# Patient Record
Sex: Male | Born: 1965 | Race: White | Hispanic: No | Marital: Married | Smoking: Current some day smoker
Health system: Southern US, Community
[De-identification: ages and names within clinical notes are randomized; demographics above are authoritative.]

## PROBLEM LIST (undated history)

## (undated) DIAGNOSIS — I219 Acute myocardial infarction, unspecified: Secondary | ICD-10-CM

## (undated) DIAGNOSIS — M869 Osteomyelitis, unspecified: Secondary | ICD-10-CM

## (undated) DIAGNOSIS — D649 Anemia, unspecified: Secondary | ICD-10-CM

## (undated) DIAGNOSIS — J189 Pneumonia, unspecified organism: Secondary | ICD-10-CM

## (undated) DIAGNOSIS — E119 Type 2 diabetes mellitus without complications: Secondary | ICD-10-CM

## (undated) DIAGNOSIS — E785 Hyperlipidemia, unspecified: Secondary | ICD-10-CM

## (undated) DIAGNOSIS — R51 Headache: Secondary | ICD-10-CM

## (undated) DIAGNOSIS — F32A Depression, unspecified: Secondary | ICD-10-CM

## (undated) DIAGNOSIS — I10 Essential (primary) hypertension: Secondary | ICD-10-CM

## (undated) DIAGNOSIS — G4730 Sleep apnea, unspecified: Secondary | ICD-10-CM

## (undated) DIAGNOSIS — E1140 Type 2 diabetes mellitus with diabetic neuropathy, unspecified: Secondary | ICD-10-CM

## (undated) DIAGNOSIS — R519 Headache, unspecified: Secondary | ICD-10-CM

## (undated) DIAGNOSIS — I2510 Atherosclerotic heart disease of native coronary artery without angina pectoris: Secondary | ICD-10-CM

## (undated) DIAGNOSIS — I509 Heart failure, unspecified: Secondary | ICD-10-CM

## (undated) DIAGNOSIS — J45909 Unspecified asthma, uncomplicated: Secondary | ICD-10-CM

## (undated) DIAGNOSIS — N189 Chronic kidney disease, unspecified: Secondary | ICD-10-CM

## (undated) DIAGNOSIS — K219 Gastro-esophageal reflux disease without esophagitis: Secondary | ICD-10-CM

## (undated) DIAGNOSIS — F329 Major depressive disorder, single episode, unspecified: Secondary | ICD-10-CM

## (undated) HISTORY — PX: CARDIAC CATHETERIZATION: SHX172

## (undated) HISTORY — PX: BELOW KNEE LEG AMPUTATION: SUR23

## (undated) HISTORY — PX: HERNIA REPAIR: SHX51

## (undated) HISTORY — PX: COLONOSCOPY W/ BIOPSIES AND POLYPECTOMY: SHX1376

## (undated) HISTORY — PX: EYE SURGERY: SHX253

## (undated) HISTORY — PX: CHOLECYSTECTOMY: SHX55

## (undated) HISTORY — PX: CORONARY ARTERY BYPASS GRAFT: SHX141

---

## 2013-12-05 DIAGNOSIS — L089 Local infection of the skin and subcutaneous tissue, unspecified: Secondary | ICD-10-CM | POA: Diagnosis not present

## 2013-12-05 DIAGNOSIS — Z794 Long term (current) use of insulin: Secondary | ICD-10-CM | POA: Diagnosis not present

## 2013-12-05 DIAGNOSIS — I809 Phlebitis and thrombophlebitis of unspecified site: Secondary | ICD-10-CM | POA: Diagnosis not present

## 2013-12-05 DIAGNOSIS — E7800 Pure hypercholesterolemia, unspecified: Secondary | ICD-10-CM | POA: Diagnosis not present

## 2013-12-05 DIAGNOSIS — Z79899 Other long term (current) drug therapy: Secondary | ICD-10-CM | POA: Diagnosis not present

## 2013-12-05 DIAGNOSIS — K8020 Calculus of gallbladder without cholecystitis without obstruction: Secondary | ICD-10-CM | POA: Diagnosis not present

## 2013-12-05 DIAGNOSIS — R1011 Right upper quadrant pain: Secondary | ICD-10-CM | POA: Diagnosis not present

## 2013-12-05 DIAGNOSIS — R109 Unspecified abdominal pain: Secondary | ICD-10-CM | POA: Diagnosis not present

## 2013-12-05 DIAGNOSIS — R112 Nausea with vomiting, unspecified: Secondary | ICD-10-CM | POA: Diagnosis not present

## 2013-12-05 DIAGNOSIS — I10 Essential (primary) hypertension: Secondary | ICD-10-CM | POA: Diagnosis not present

## 2013-12-05 DIAGNOSIS — I509 Heart failure, unspecified: Secondary | ICD-10-CM | POA: Diagnosis not present

## 2013-12-05 DIAGNOSIS — K5900 Constipation, unspecified: Secondary | ICD-10-CM | POA: Diagnosis not present

## 2013-12-05 DIAGNOSIS — E119 Type 2 diabetes mellitus without complications: Secondary | ICD-10-CM | POA: Diagnosis not present

## 2013-12-06 DIAGNOSIS — Z794 Long term (current) use of insulin: Secondary | ICD-10-CM | POA: Diagnosis not present

## 2013-12-06 DIAGNOSIS — I10 Essential (primary) hypertension: Secondary | ICD-10-CM | POA: Diagnosis not present

## 2013-12-06 DIAGNOSIS — Z79899 Other long term (current) drug therapy: Secondary | ICD-10-CM | POA: Diagnosis not present

## 2013-12-06 DIAGNOSIS — E7800 Pure hypercholesterolemia, unspecified: Secondary | ICD-10-CM | POA: Diagnosis not present

## 2013-12-06 DIAGNOSIS — I808 Phlebitis and thrombophlebitis of other sites: Secondary | ICD-10-CM | POA: Diagnosis not present

## 2013-12-06 DIAGNOSIS — I509 Heart failure, unspecified: Secondary | ICD-10-CM | POA: Diagnosis not present

## 2013-12-06 DIAGNOSIS — I809 Phlebitis and thrombophlebitis of unspecified site: Secondary | ICD-10-CM | POA: Diagnosis not present

## 2013-12-06 DIAGNOSIS — E119 Type 2 diabetes mellitus without complications: Secondary | ICD-10-CM | POA: Diagnosis not present

## 2013-12-14 DIAGNOSIS — I509 Heart failure, unspecified: Secondary | ICD-10-CM | POA: Diagnosis not present

## 2013-12-14 DIAGNOSIS — R1031 Right lower quadrant pain: Secondary | ICD-10-CM | POA: Diagnosis not present

## 2013-12-14 DIAGNOSIS — E119 Type 2 diabetes mellitus without complications: Secondary | ICD-10-CM | POA: Diagnosis not present

## 2013-12-14 DIAGNOSIS — E7800 Pure hypercholesterolemia, unspecified: Secondary | ICD-10-CM | POA: Diagnosis not present

## 2013-12-14 DIAGNOSIS — I10 Essential (primary) hypertension: Secondary | ICD-10-CM | POA: Diagnosis not present

## 2013-12-14 DIAGNOSIS — R0989 Other specified symptoms and signs involving the circulatory and respiratory systems: Secondary | ICD-10-CM | POA: Diagnosis not present

## 2013-12-14 DIAGNOSIS — K2990 Gastroduodenitis, unspecified, without bleeding: Secondary | ICD-10-CM | POA: Diagnosis not present

## 2013-12-14 DIAGNOSIS — R109 Unspecified abdominal pain: Secondary | ICD-10-CM | POA: Diagnosis not present

## 2013-12-14 DIAGNOSIS — K2970 Gastritis, unspecified, without bleeding: Secondary | ICD-10-CM | POA: Diagnosis not present

## 2013-12-14 DIAGNOSIS — Z794 Long term (current) use of insulin: Secondary | ICD-10-CM | POA: Diagnosis not present

## 2013-12-14 DIAGNOSIS — R509 Fever, unspecified: Secondary | ICD-10-CM | POA: Diagnosis not present

## 2013-12-14 DIAGNOSIS — R110 Nausea: Secondary | ICD-10-CM | POA: Diagnosis not present

## 2013-12-14 DIAGNOSIS — R112 Nausea with vomiting, unspecified: Secondary | ICD-10-CM | POA: Diagnosis not present

## 2013-12-14 DIAGNOSIS — Z79899 Other long term (current) drug therapy: Secondary | ICD-10-CM | POA: Diagnosis not present

## 2013-12-14 DIAGNOSIS — R1011 Right upper quadrant pain: Secondary | ICD-10-CM | POA: Diagnosis not present

## 2013-12-14 DIAGNOSIS — K828 Other specified diseases of gallbladder: Secondary | ICD-10-CM | POA: Diagnosis not present

## 2013-12-22 DIAGNOSIS — G4710 Hypersomnia, unspecified: Secondary | ICD-10-CM | POA: Diagnosis not present

## 2013-12-24 DIAGNOSIS — G4733 Obstructive sleep apnea (adult) (pediatric): Secondary | ICD-10-CM | POA: Diagnosis not present

## 2013-12-24 DIAGNOSIS — K8010 Calculus of gallbladder with chronic cholecystitis without obstruction: Secondary | ICD-10-CM | POA: Diagnosis not present

## 2013-12-24 DIAGNOSIS — E119 Type 2 diabetes mellitus without complications: Secondary | ICD-10-CM | POA: Diagnosis not present

## 2013-12-24 DIAGNOSIS — D689 Coagulation defect, unspecified: Secondary | ICD-10-CM | POA: Diagnosis not present

## 2013-12-30 DIAGNOSIS — Z951 Presence of aortocoronary bypass graft: Secondary | ICD-10-CM | POA: Diagnosis not present

## 2013-12-30 DIAGNOSIS — I7090 Unspecified atherosclerosis: Secondary | ICD-10-CM | POA: Diagnosis not present

## 2013-12-30 DIAGNOSIS — I10 Essential (primary) hypertension: Secondary | ICD-10-CM | POA: Diagnosis not present

## 2013-12-30 DIAGNOSIS — I2699 Other pulmonary embolism without acute cor pulmonale: Secondary | ICD-10-CM | POA: Diagnosis not present

## 2013-12-30 DIAGNOSIS — I252 Old myocardial infarction: Secondary | ICD-10-CM | POA: Diagnosis not present

## 2013-12-30 DIAGNOSIS — I739 Peripheral vascular disease, unspecified: Secondary | ICD-10-CM | POA: Diagnosis not present

## 2013-12-30 DIAGNOSIS — E785 Hyperlipidemia, unspecified: Secondary | ICD-10-CM | POA: Diagnosis not present

## 2013-12-30 DIAGNOSIS — I2510 Atherosclerotic heart disease of native coronary artery without angina pectoris: Secondary | ICD-10-CM | POA: Diagnosis not present

## 2014-01-04 DIAGNOSIS — G4730 Sleep apnea, unspecified: Secondary | ICD-10-CM | POA: Diagnosis not present

## 2014-01-11 DIAGNOSIS — I2510 Atherosclerotic heart disease of native coronary artery without angina pectoris: Secondary | ICD-10-CM | POA: Diagnosis not present

## 2014-01-11 DIAGNOSIS — K8010 Calculus of gallbladder with chronic cholecystitis without obstruction: Secondary | ICD-10-CM | POA: Diagnosis not present

## 2014-01-11 DIAGNOSIS — E119 Type 2 diabetes mellitus without complications: Secondary | ICD-10-CM | POA: Diagnosis not present

## 2014-01-11 DIAGNOSIS — R109 Unspecified abdominal pain: Secondary | ICD-10-CM | POA: Diagnosis not present

## 2014-01-12 DIAGNOSIS — I2510 Atherosclerotic heart disease of native coronary artery without angina pectoris: Secondary | ICD-10-CM | POA: Diagnosis present

## 2014-01-12 DIAGNOSIS — Z951 Presence of aortocoronary bypass graft: Secondary | ICD-10-CM | POA: Diagnosis not present

## 2014-01-12 DIAGNOSIS — D689 Coagulation defect, unspecified: Secondary | ICD-10-CM | POA: Diagnosis not present

## 2014-01-12 DIAGNOSIS — I509 Heart failure, unspecified: Secondary | ICD-10-CM | POA: Diagnosis present

## 2014-01-12 DIAGNOSIS — K219 Gastro-esophageal reflux disease without esophagitis: Secondary | ICD-10-CM | POA: Diagnosis present

## 2014-01-12 DIAGNOSIS — Z87891 Personal history of nicotine dependence: Secondary | ICD-10-CM | POA: Diagnosis not present

## 2014-01-12 DIAGNOSIS — Z79899 Other long term (current) drug therapy: Secondary | ICD-10-CM | POA: Diagnosis not present

## 2014-01-12 DIAGNOSIS — Z7982 Long term (current) use of aspirin: Secondary | ICD-10-CM | POA: Diagnosis not present

## 2014-01-12 DIAGNOSIS — I739 Peripheral vascular disease, unspecified: Secondary | ICD-10-CM | POA: Diagnosis present

## 2014-01-12 DIAGNOSIS — E785 Hyperlipidemia, unspecified: Secondary | ICD-10-CM | POA: Diagnosis present

## 2014-01-12 DIAGNOSIS — Z8614 Personal history of Methicillin resistant Staphylococcus aureus infection: Secondary | ICD-10-CM | POA: Diagnosis not present

## 2014-01-12 DIAGNOSIS — Z23 Encounter for immunization: Secondary | ICD-10-CM | POA: Diagnosis not present

## 2014-01-12 DIAGNOSIS — K660 Peritoneal adhesions (postprocedural) (postinfection): Secondary | ICD-10-CM | POA: Diagnosis present

## 2014-01-12 DIAGNOSIS — I129 Hypertensive chronic kidney disease with stage 1 through stage 4 chronic kidney disease, or unspecified chronic kidney disease: Secondary | ICD-10-CM | POA: Diagnosis present

## 2014-01-12 DIAGNOSIS — E119 Type 2 diabetes mellitus without complications: Secondary | ICD-10-CM | POA: Diagnosis present

## 2014-01-12 DIAGNOSIS — S88119A Complete traumatic amputation at level between knee and ankle, unspecified lower leg, initial encounter: Secondary | ICD-10-CM | POA: Diagnosis not present

## 2014-01-12 DIAGNOSIS — Z86711 Personal history of pulmonary embolism: Secondary | ICD-10-CM | POA: Diagnosis not present

## 2014-01-12 DIAGNOSIS — Z7901 Long term (current) use of anticoagulants: Secondary | ICD-10-CM | POA: Diagnosis not present

## 2014-01-12 DIAGNOSIS — I252 Old myocardial infarction: Secondary | ICD-10-CM | POA: Diagnosis not present

## 2014-01-12 DIAGNOSIS — G4733 Obstructive sleep apnea (adult) (pediatric): Secondary | ICD-10-CM | POA: Diagnosis present

## 2014-01-12 DIAGNOSIS — K8010 Calculus of gallbladder with chronic cholecystitis without obstruction: Secondary | ICD-10-CM | POA: Diagnosis present

## 2014-01-12 DIAGNOSIS — N189 Chronic kidney disease, unspecified: Secondary | ICD-10-CM | POA: Diagnosis present

## 2014-01-14 DIAGNOSIS — N183 Chronic kidney disease, stage 3 unspecified: Secondary | ICD-10-CM | POA: Diagnosis not present

## 2014-01-14 DIAGNOSIS — I129 Hypertensive chronic kidney disease with stage 1 through stage 4 chronic kidney disease, or unspecified chronic kidney disease: Secondary | ICD-10-CM | POA: Diagnosis not present

## 2014-01-14 DIAGNOSIS — I509 Heart failure, unspecified: Secondary | ICD-10-CM | POA: Diagnosis not present

## 2014-01-14 DIAGNOSIS — L02818 Cutaneous abscess of other sites: Secondary | ICD-10-CM | POA: Diagnosis not present

## 2014-01-14 DIAGNOSIS — E119 Type 2 diabetes mellitus without complications: Secondary | ICD-10-CM | POA: Diagnosis not present

## 2014-01-14 DIAGNOSIS — E7800 Pure hypercholesterolemia, unspecified: Secondary | ICD-10-CM | POA: Diagnosis not present

## 2014-01-14 DIAGNOSIS — I2510 Atherosclerotic heart disease of native coronary artery without angina pectoris: Secondary | ICD-10-CM | POA: Diagnosis not present

## 2014-01-14 DIAGNOSIS — Z794 Long term (current) use of insulin: Secondary | ICD-10-CM | POA: Diagnosis not present

## 2014-01-14 DIAGNOSIS — Z79899 Other long term (current) drug therapy: Secondary | ICD-10-CM | POA: Diagnosis not present

## 2014-01-20 DIAGNOSIS — L03818 Cellulitis of other sites: Secondary | ICD-10-CM | POA: Diagnosis not present

## 2014-01-20 DIAGNOSIS — L02818 Cutaneous abscess of other sites: Secondary | ICD-10-CM | POA: Diagnosis not present

## 2014-01-20 DIAGNOSIS — E119 Type 2 diabetes mellitus without complications: Secondary | ICD-10-CM | POA: Diagnosis not present

## 2014-01-20 DIAGNOSIS — I2510 Atherosclerotic heart disease of native coronary artery without angina pectoris: Secondary | ICD-10-CM | POA: Diagnosis not present

## 2014-01-20 DIAGNOSIS — I129 Hypertensive chronic kidney disease with stage 1 through stage 4 chronic kidney disease, or unspecified chronic kidney disease: Secondary | ICD-10-CM | POA: Diagnosis not present

## 2014-01-20 DIAGNOSIS — R609 Edema, unspecified: Secondary | ICD-10-CM | POA: Diagnosis not present

## 2014-01-20 DIAGNOSIS — Z79899 Other long term (current) drug therapy: Secondary | ICD-10-CM | POA: Diagnosis not present

## 2014-01-20 DIAGNOSIS — Z794 Long term (current) use of insulin: Secondary | ICD-10-CM | POA: Diagnosis not present

## 2014-01-20 DIAGNOSIS — M79609 Pain in unspecified limb: Secondary | ICD-10-CM | POA: Diagnosis not present

## 2014-01-20 DIAGNOSIS — N183 Chronic kidney disease, stage 3 unspecified: Secondary | ICD-10-CM | POA: Diagnosis not present

## 2014-01-20 DIAGNOSIS — I509 Heart failure, unspecified: Secondary | ICD-10-CM | POA: Diagnosis not present

## 2014-01-20 DIAGNOSIS — E7800 Pure hypercholesterolemia, unspecified: Secondary | ICD-10-CM | POA: Diagnosis not present

## 2014-01-20 DIAGNOSIS — S88119A Complete traumatic amputation at level between knee and ankle, unspecified lower leg, initial encounter: Secondary | ICD-10-CM | POA: Diagnosis not present

## 2014-01-26 DIAGNOSIS — I2699 Other pulmonary embolism without acute cor pulmonale: Secondary | ICD-10-CM | POA: Diagnosis not present

## 2014-01-26 DIAGNOSIS — I10 Essential (primary) hypertension: Secondary | ICD-10-CM | POA: Diagnosis not present

## 2014-01-26 DIAGNOSIS — K219 Gastro-esophageal reflux disease without esophagitis: Secondary | ICD-10-CM | POA: Diagnosis not present

## 2014-01-26 DIAGNOSIS — IMO0001 Reserved for inherently not codable concepts without codable children: Secondary | ICD-10-CM | POA: Diagnosis not present

## 2014-02-07 DIAGNOSIS — E7800 Pure hypercholesterolemia, unspecified: Secondary | ICD-10-CM | POA: Diagnosis not present

## 2014-02-07 DIAGNOSIS — Z794 Long term (current) use of insulin: Secondary | ICD-10-CM | POA: Diagnosis not present

## 2014-02-07 DIAGNOSIS — L02818 Cutaneous abscess of other sites: Secondary | ICD-10-CM | POA: Diagnosis not present

## 2014-02-07 DIAGNOSIS — R221 Localized swelling, mass and lump, neck: Secondary | ICD-10-CM | POA: Diagnosis not present

## 2014-02-07 DIAGNOSIS — L03818 Cellulitis of other sites: Secondary | ICD-10-CM | POA: Diagnosis not present

## 2014-02-07 DIAGNOSIS — I509 Heart failure, unspecified: Secondary | ICD-10-CM | POA: Diagnosis not present

## 2014-02-07 DIAGNOSIS — N183 Chronic kidney disease, stage 3 unspecified: Secondary | ICD-10-CM | POA: Diagnosis not present

## 2014-02-07 DIAGNOSIS — I2510 Atherosclerotic heart disease of native coronary artery without angina pectoris: Secondary | ICD-10-CM | POA: Diagnosis not present

## 2014-02-07 DIAGNOSIS — Z79899 Other long term (current) drug therapy: Secondary | ICD-10-CM | POA: Diagnosis not present

## 2014-02-07 DIAGNOSIS — I129 Hypertensive chronic kidney disease with stage 1 through stage 4 chronic kidney disease, or unspecified chronic kidney disease: Secondary | ICD-10-CM | POA: Diagnosis not present

## 2014-02-07 DIAGNOSIS — E119 Type 2 diabetes mellitus without complications: Secondary | ICD-10-CM | POA: Diagnosis not present

## 2014-02-07 DIAGNOSIS — R220 Localized swelling, mass and lump, head: Secondary | ICD-10-CM | POA: Diagnosis not present

## 2014-03-10 DIAGNOSIS — G4730 Sleep apnea, unspecified: Secondary | ICD-10-CM | POA: Diagnosis not present

## 2014-03-10 DIAGNOSIS — I2510 Atherosclerotic heart disease of native coronary artery without angina pectoris: Secondary | ICD-10-CM | POA: Diagnosis not present

## 2014-03-11 DIAGNOSIS — I2510 Atherosclerotic heart disease of native coronary artery without angina pectoris: Secondary | ICD-10-CM | POA: Diagnosis not present

## 2014-03-11 DIAGNOSIS — E119 Type 2 diabetes mellitus without complications: Secondary | ICD-10-CM | POA: Diagnosis not present

## 2014-03-11 DIAGNOSIS — M542 Cervicalgia: Secondary | ICD-10-CM | POA: Diagnosis not present

## 2014-03-11 DIAGNOSIS — E860 Dehydration: Secondary | ICD-10-CM | POA: Diagnosis not present

## 2014-03-11 DIAGNOSIS — S88119A Complete traumatic amputation at level between knee and ankle, unspecified lower leg, initial encounter: Secondary | ICD-10-CM | POA: Diagnosis not present

## 2014-03-11 DIAGNOSIS — R5383 Other fatigue: Secondary | ICD-10-CM | POA: Diagnosis not present

## 2014-03-11 DIAGNOSIS — Z79899 Other long term (current) drug therapy: Secondary | ICD-10-CM | POA: Diagnosis not present

## 2014-03-11 DIAGNOSIS — N183 Chronic kidney disease, stage 3 unspecified: Secondary | ICD-10-CM | POA: Diagnosis not present

## 2014-03-11 DIAGNOSIS — I129 Hypertensive chronic kidney disease with stage 1 through stage 4 chronic kidney disease, or unspecified chronic kidney disease: Secondary | ICD-10-CM | POA: Diagnosis not present

## 2014-03-11 DIAGNOSIS — I509 Heart failure, unspecified: Secondary | ICD-10-CM | POA: Diagnosis not present

## 2014-03-11 DIAGNOSIS — R51 Headache: Secondary | ICD-10-CM | POA: Diagnosis not present

## 2014-03-11 DIAGNOSIS — R5381 Other malaise: Secondary | ICD-10-CM | POA: Diagnosis not present

## 2014-03-11 DIAGNOSIS — Z794 Long term (current) use of insulin: Secondary | ICD-10-CM | POA: Diagnosis not present

## 2014-03-11 DIAGNOSIS — E7800 Pure hypercholesterolemia, unspecified: Secondary | ICD-10-CM | POA: Diagnosis not present

## 2014-03-17 DIAGNOSIS — Z7901 Long term (current) use of anticoagulants: Secondary | ICD-10-CM | POA: Diagnosis not present

## 2014-03-17 DIAGNOSIS — E119 Type 2 diabetes mellitus without complications: Secondary | ICD-10-CM | POA: Diagnosis not present

## 2014-03-17 DIAGNOSIS — Z951 Presence of aortocoronary bypass graft: Secondary | ICD-10-CM | POA: Diagnosis not present

## 2014-03-17 DIAGNOSIS — Z79899 Other long term (current) drug therapy: Secondary | ICD-10-CM | POA: Diagnosis not present

## 2014-03-17 DIAGNOSIS — I129 Hypertensive chronic kidney disease with stage 1 through stage 4 chronic kidney disease, or unspecified chronic kidney disease: Secondary | ICD-10-CM | POA: Diagnosis not present

## 2014-03-17 DIAGNOSIS — I509 Heart failure, unspecified: Secondary | ICD-10-CM | POA: Diagnosis not present

## 2014-03-17 DIAGNOSIS — R079 Chest pain, unspecified: Secondary | ICD-10-CM | POA: Diagnosis not present

## 2014-03-17 DIAGNOSIS — I2510 Atherosclerotic heart disease of native coronary artery without angina pectoris: Secondary | ICD-10-CM | POA: Diagnosis not present

## 2014-03-17 DIAGNOSIS — Z794 Long term (current) use of insulin: Secondary | ICD-10-CM | POA: Diagnosis not present

## 2014-03-17 DIAGNOSIS — I252 Old myocardial infarction: Secondary | ICD-10-CM | POA: Diagnosis not present

## 2014-03-17 DIAGNOSIS — N189 Chronic kidney disease, unspecified: Secondary | ICD-10-CM | POA: Diagnosis not present

## 2014-03-17 DIAGNOSIS — I959 Hypotension, unspecified: Secondary | ICD-10-CM | POA: Diagnosis not present

## 2014-03-17 DIAGNOSIS — Z86711 Personal history of pulmonary embolism: Secondary | ICD-10-CM | POA: Diagnosis not present

## 2014-03-17 DIAGNOSIS — N2589 Other disorders resulting from impaired renal tubular function: Secondary | ICD-10-CM | POA: Diagnosis not present

## 2014-03-17 DIAGNOSIS — Z7982 Long term (current) use of aspirin: Secondary | ICD-10-CM | POA: Diagnosis not present

## 2014-03-17 DIAGNOSIS — I10 Essential (primary) hypertension: Secondary | ICD-10-CM | POA: Diagnosis not present

## 2014-03-22 DIAGNOSIS — I7090 Unspecified atherosclerosis: Secondary | ICD-10-CM | POA: Diagnosis not present

## 2014-03-22 DIAGNOSIS — E785 Hyperlipidemia, unspecified: Secondary | ICD-10-CM | POA: Diagnosis not present

## 2014-03-22 DIAGNOSIS — I2510 Atherosclerotic heart disease of native coronary artery without angina pectoris: Secondary | ICD-10-CM | POA: Diagnosis not present

## 2014-03-22 DIAGNOSIS — I252 Old myocardial infarction: Secondary | ICD-10-CM | POA: Diagnosis not present

## 2014-03-22 DIAGNOSIS — I10 Essential (primary) hypertension: Secondary | ICD-10-CM | POA: Diagnosis not present

## 2014-03-22 DIAGNOSIS — Z951 Presence of aortocoronary bypass graft: Secondary | ICD-10-CM | POA: Diagnosis not present

## 2014-04-07 DIAGNOSIS — S98919A Complete traumatic amputation of unspecified foot, level unspecified, initial encounter: Secondary | ICD-10-CM | POA: Diagnosis not present

## 2014-04-07 DIAGNOSIS — R262 Difficulty in walking, not elsewhere classified: Secondary | ICD-10-CM | POA: Diagnosis not present

## 2014-04-07 DIAGNOSIS — I2510 Atherosclerotic heart disease of native coronary artery without angina pectoris: Secondary | ICD-10-CM | POA: Diagnosis not present

## 2014-04-21 DIAGNOSIS — K219 Gastro-esophageal reflux disease without esophagitis: Secondary | ICD-10-CM | POA: Diagnosis not present

## 2014-04-21 DIAGNOSIS — Z7901 Long term (current) use of anticoagulants: Secondary | ICD-10-CM | POA: Diagnosis not present

## 2014-04-21 DIAGNOSIS — Z800 Family history of malignant neoplasm of digestive organs: Secondary | ICD-10-CM | POA: Diagnosis not present

## 2014-04-29 DIAGNOSIS — D131 Benign neoplasm of stomach: Secondary | ICD-10-CM | POA: Diagnosis not present

## 2014-04-29 DIAGNOSIS — Z7901 Long term (current) use of anticoagulants: Secondary | ICD-10-CM | POA: Diagnosis not present

## 2014-04-29 DIAGNOSIS — K219 Gastro-esophageal reflux disease without esophagitis: Secondary | ICD-10-CM | POA: Diagnosis not present

## 2014-04-29 DIAGNOSIS — Z800 Family history of malignant neoplasm of digestive organs: Secondary | ICD-10-CM | POA: Diagnosis not present

## 2014-04-29 DIAGNOSIS — D126 Benign neoplasm of colon, unspecified: Secondary | ICD-10-CM | POA: Diagnosis not present

## 2014-04-29 DIAGNOSIS — I2510 Atherosclerotic heart disease of native coronary artery without angina pectoris: Secondary | ICD-10-CM | POA: Diagnosis not present

## 2014-06-04 DIAGNOSIS — Z1389 Encounter for screening for other disorder: Secondary | ICD-10-CM | POA: Diagnosis not present

## 2014-06-04 DIAGNOSIS — Z719 Counseling, unspecified: Secondary | ICD-10-CM | POA: Diagnosis not present

## 2014-06-04 DIAGNOSIS — E1165 Type 2 diabetes mellitus with hyperglycemia: Secondary | ICD-10-CM | POA: Diagnosis not present

## 2014-06-04 DIAGNOSIS — E1140 Type 2 diabetes mellitus with diabetic neuropathy, unspecified: Secondary | ICD-10-CM | POA: Diagnosis not present

## 2014-07-05 DIAGNOSIS — I252 Old myocardial infarction: Secondary | ICD-10-CM | POA: Diagnosis not present

## 2014-07-05 DIAGNOSIS — K219 Gastro-esophageal reflux disease without esophagitis: Secondary | ICD-10-CM | POA: Diagnosis not present

## 2014-07-05 DIAGNOSIS — R9431 Abnormal electrocardiogram [ECG] [EKG]: Secondary | ICD-10-CM | POA: Diagnosis not present

## 2014-07-05 DIAGNOSIS — I509 Heart failure, unspecified: Secondary | ICD-10-CM | POA: Diagnosis not present

## 2014-07-05 DIAGNOSIS — Z87891 Personal history of nicotine dependence: Secondary | ICD-10-CM | POA: Diagnosis not present

## 2014-07-05 DIAGNOSIS — Z794 Long term (current) use of insulin: Secondary | ICD-10-CM | POA: Diagnosis not present

## 2014-07-05 DIAGNOSIS — Z23 Encounter for immunization: Secondary | ICD-10-CM | POA: Diagnosis not present

## 2014-07-05 DIAGNOSIS — Z951 Presence of aortocoronary bypass graft: Secondary | ICD-10-CM | POA: Diagnosis not present

## 2014-07-05 DIAGNOSIS — R072 Precordial pain: Secondary | ICD-10-CM | POA: Diagnosis not present

## 2014-07-05 DIAGNOSIS — Z79899 Other long term (current) drug therapy: Secondary | ICD-10-CM | POA: Diagnosis not present

## 2014-07-05 DIAGNOSIS — I200 Unstable angina: Secondary | ICD-10-CM | POA: Diagnosis not present

## 2014-07-05 DIAGNOSIS — I129 Hypertensive chronic kidney disease with stage 1 through stage 4 chronic kidney disease, or unspecified chronic kidney disease: Secondary | ICD-10-CM | POA: Diagnosis not present

## 2014-07-05 DIAGNOSIS — Z7982 Long term (current) use of aspirin: Secondary | ICD-10-CM | POA: Diagnosis not present

## 2014-07-05 DIAGNOSIS — N183 Chronic kidney disease, stage 3 (moderate): Secondary | ICD-10-CM | POA: Diagnosis not present

## 2014-07-05 DIAGNOSIS — E1159 Type 2 diabetes mellitus with other circulatory complications: Secondary | ICD-10-CM | POA: Diagnosis not present

## 2014-07-05 DIAGNOSIS — Z89511 Acquired absence of right leg below knee: Secondary | ICD-10-CM | POA: Diagnosis not present

## 2014-07-05 DIAGNOSIS — Z7901 Long term (current) use of anticoagulants: Secondary | ICD-10-CM | POA: Diagnosis not present

## 2014-07-05 DIAGNOSIS — J984 Other disorders of lung: Secondary | ICD-10-CM | POA: Diagnosis not present

## 2014-07-05 DIAGNOSIS — I25118 Atherosclerotic heart disease of native coronary artery with other forms of angina pectoris: Secondary | ICD-10-CM | POA: Diagnosis not present

## 2014-07-05 DIAGNOSIS — E784 Other hyperlipidemia: Secondary | ICD-10-CM | POA: Diagnosis not present

## 2014-07-05 DIAGNOSIS — R079 Chest pain, unspecified: Secondary | ICD-10-CM | POA: Diagnosis not present

## 2014-07-05 DIAGNOSIS — F329 Major depressive disorder, single episode, unspecified: Secondary | ICD-10-CM | POA: Diagnosis not present

## 2014-07-05 DIAGNOSIS — E119 Type 2 diabetes mellitus without complications: Secondary | ICD-10-CM | POA: Diagnosis not present

## 2014-07-05 DIAGNOSIS — Z86711 Personal history of pulmonary embolism: Secondary | ICD-10-CM | POA: Diagnosis not present

## 2014-07-06 DIAGNOSIS — E784 Other hyperlipidemia: Secondary | ICD-10-CM | POA: Diagnosis not present

## 2014-07-06 DIAGNOSIS — R0789 Other chest pain: Secondary | ICD-10-CM | POA: Diagnosis not present

## 2014-07-06 DIAGNOSIS — I25118 Atherosclerotic heart disease of native coronary artery with other forms of angina pectoris: Secondary | ICD-10-CM | POA: Diagnosis not present

## 2014-07-06 DIAGNOSIS — R079 Chest pain, unspecified: Secondary | ICD-10-CM | POA: Diagnosis not present

## 2014-07-06 DIAGNOSIS — E119 Type 2 diabetes mellitus without complications: Secondary | ICD-10-CM | POA: Diagnosis not present

## 2014-07-06 DIAGNOSIS — I498 Other specified cardiac arrhythmias: Secondary | ICD-10-CM | POA: Diagnosis not present

## 2014-07-21 DIAGNOSIS — I10 Essential (primary) hypertension: Secondary | ICD-10-CM | POA: Diagnosis not present

## 2014-07-21 DIAGNOSIS — I252 Old myocardial infarction: Secondary | ICD-10-CM | POA: Diagnosis not present

## 2014-07-21 DIAGNOSIS — Z951 Presence of aortocoronary bypass graft: Secondary | ICD-10-CM | POA: Diagnosis not present

## 2014-07-21 DIAGNOSIS — Z86711 Personal history of pulmonary embolism: Secondary | ICD-10-CM | POA: Diagnosis not present

## 2014-07-21 DIAGNOSIS — I509 Heart failure, unspecified: Secondary | ICD-10-CM | POA: Diagnosis not present

## 2014-07-21 DIAGNOSIS — E119 Type 2 diabetes mellitus without complications: Secondary | ICD-10-CM | POA: Diagnosis not present

## 2014-08-04 DIAGNOSIS — R269 Unspecified abnormalities of gait and mobility: Secondary | ICD-10-CM | POA: Diagnosis not present

## 2014-08-04 DIAGNOSIS — Z89439 Acquired absence of unspecified foot: Secondary | ICD-10-CM | POA: Diagnosis not present

## 2014-08-04 DIAGNOSIS — B351 Tinea unguium: Secondary | ICD-10-CM | POA: Diagnosis not present

## 2014-08-04 DIAGNOSIS — I2510 Atherosclerotic heart disease of native coronary artery without angina pectoris: Secondary | ICD-10-CM | POA: Diagnosis not present

## 2014-08-04 DIAGNOSIS — L853 Xerosis cutis: Secondary | ICD-10-CM | POA: Diagnosis not present

## 2014-09-02 DIAGNOSIS — E1165 Type 2 diabetes mellitus with hyperglycemia: Secondary | ICD-10-CM | POA: Diagnosis not present

## 2014-09-02 DIAGNOSIS — F329 Major depressive disorder, single episode, unspecified: Secondary | ICD-10-CM | POA: Diagnosis not present

## 2014-09-02 DIAGNOSIS — Z9981 Dependence on supplemental oxygen: Secondary | ICD-10-CM | POA: Diagnosis not present

## 2014-09-02 DIAGNOSIS — R0902 Hypoxemia: Secondary | ICD-10-CM | POA: Diagnosis not present

## 2014-09-14 DIAGNOSIS — F329 Major depressive disorder, single episode, unspecified: Secondary | ICD-10-CM | POA: Diagnosis not present

## 2014-10-13 DIAGNOSIS — F329 Major depressive disorder, single episode, unspecified: Secondary | ICD-10-CM | POA: Diagnosis not present

## 2014-10-28 DIAGNOSIS — R079 Chest pain, unspecified: Secondary | ICD-10-CM | POA: Diagnosis not present

## 2014-10-28 DIAGNOSIS — R0602 Shortness of breath: Secondary | ICD-10-CM | POA: Diagnosis not present

## 2014-10-28 DIAGNOSIS — M25421 Effusion, right elbow: Secondary | ICD-10-CM | POA: Diagnosis not present

## 2014-10-28 DIAGNOSIS — L03113 Cellulitis of right upper limb: Secondary | ICD-10-CM | POA: Diagnosis not present

## 2014-10-28 DIAGNOSIS — R0789 Other chest pain: Secondary | ICD-10-CM | POA: Diagnosis not present

## 2014-10-30 DIAGNOSIS — L03113 Cellulitis of right upper limb: Secondary | ICD-10-CM | POA: Diagnosis not present

## 2014-10-30 DIAGNOSIS — R609 Edema, unspecified: Secondary | ICD-10-CM | POA: Diagnosis present

## 2014-10-30 DIAGNOSIS — Z951 Presence of aortocoronary bypass graft: Secondary | ICD-10-CM | POA: Diagnosis not present

## 2014-10-30 DIAGNOSIS — Z9889 Other specified postprocedural states: Secondary | ICD-10-CM | POA: Diagnosis not present

## 2014-10-30 DIAGNOSIS — M109 Gout, unspecified: Secondary | ICD-10-CM | POA: Diagnosis present

## 2014-10-30 DIAGNOSIS — Z8701 Personal history of pneumonia (recurrent): Secondary | ICD-10-CM | POA: Diagnosis not present

## 2014-10-30 DIAGNOSIS — M25421 Effusion, right elbow: Secondary | ICD-10-CM | POA: Diagnosis not present

## 2014-10-30 DIAGNOSIS — I509 Heart failure, unspecified: Secondary | ICD-10-CM | POA: Diagnosis present

## 2014-10-30 DIAGNOSIS — N183 Chronic kidney disease, stage 3 (moderate): Secondary | ICD-10-CM | POA: Diagnosis present

## 2014-10-30 DIAGNOSIS — M19049 Primary osteoarthritis, unspecified hand: Secondary | ICD-10-CM | POA: Diagnosis present

## 2014-10-30 DIAGNOSIS — M65821 Other synovitis and tenosynovitis, right upper arm: Secondary | ICD-10-CM | POA: Diagnosis present

## 2014-10-30 DIAGNOSIS — D696 Thrombocytopenia, unspecified: Secondary | ICD-10-CM | POA: Diagnosis not present

## 2014-10-30 DIAGNOSIS — Z87891 Personal history of nicotine dependence: Secondary | ICD-10-CM | POA: Diagnosis not present

## 2014-10-30 DIAGNOSIS — Z8614 Personal history of Methicillin resistant Staphylococcus aureus infection: Secondary | ICD-10-CM | POA: Diagnosis not present

## 2014-10-30 DIAGNOSIS — Z452 Encounter for adjustment and management of vascular access device: Secondary | ICD-10-CM | POA: Diagnosis not present

## 2014-10-30 DIAGNOSIS — Z8709 Personal history of other diseases of the respiratory system: Secondary | ICD-10-CM | POA: Diagnosis not present

## 2014-10-30 DIAGNOSIS — F419 Anxiety disorder, unspecified: Secondary | ICD-10-CM | POA: Diagnosis present

## 2014-10-30 DIAGNOSIS — I252 Old myocardial infarction: Secondary | ICD-10-CM | POA: Diagnosis not present

## 2014-10-30 DIAGNOSIS — I25810 Atherosclerosis of coronary artery bypass graft(s) without angina pectoris: Secondary | ICD-10-CM | POA: Diagnosis not present

## 2014-10-30 DIAGNOSIS — N189 Chronic kidney disease, unspecified: Secondary | ICD-10-CM | POA: Diagnosis not present

## 2014-10-30 DIAGNOSIS — E780 Pure hypercholesterolemia: Secondary | ICD-10-CM | POA: Diagnosis present

## 2014-10-30 DIAGNOSIS — Z789 Other specified health status: Secondary | ICD-10-CM | POA: Diagnosis not present

## 2014-10-30 DIAGNOSIS — E1165 Type 2 diabetes mellitus with hyperglycemia: Secondary | ICD-10-CM | POA: Diagnosis not present

## 2014-10-30 DIAGNOSIS — K219 Gastro-esophageal reflux disease without esophagitis: Secondary | ICD-10-CM | POA: Diagnosis present

## 2014-10-30 DIAGNOSIS — I129 Hypertensive chronic kidney disease with stage 1 through stage 4 chronic kidney disease, or unspecified chronic kidney disease: Secondary | ICD-10-CM | POA: Diagnosis present

## 2014-10-30 DIAGNOSIS — Z86711 Personal history of pulmonary embolism: Secondary | ICD-10-CM | POA: Diagnosis not present

## 2014-10-30 DIAGNOSIS — M659 Synovitis and tenosynovitis, unspecified: Secondary | ICD-10-CM | POA: Diagnosis not present

## 2014-10-30 DIAGNOSIS — F329 Major depressive disorder, single episode, unspecified: Secondary | ICD-10-CM | POA: Diagnosis present

## 2014-10-30 DIAGNOSIS — M170 Bilateral primary osteoarthritis of knee: Secondary | ICD-10-CM | POA: Diagnosis present

## 2014-11-03 DIAGNOSIS — L03113 Cellulitis of right upper limb: Secondary | ICD-10-CM | POA: Diagnosis not present

## 2014-11-03 DIAGNOSIS — I129 Hypertensive chronic kidney disease with stage 1 through stage 4 chronic kidney disease, or unspecified chronic kidney disease: Secondary | ICD-10-CM | POA: Diagnosis not present

## 2014-11-03 DIAGNOSIS — Z794 Long term (current) use of insulin: Secondary | ICD-10-CM | POA: Diagnosis not present

## 2014-11-03 DIAGNOSIS — N183 Chronic kidney disease, stage 3 (moderate): Secondary | ICD-10-CM | POA: Diagnosis not present

## 2014-11-03 DIAGNOSIS — E1165 Type 2 diabetes mellitus with hyperglycemia: Secondary | ICD-10-CM | POA: Diagnosis not present

## 2014-11-03 DIAGNOSIS — M659 Synovitis and tenosynovitis, unspecified: Secondary | ICD-10-CM | POA: Diagnosis not present

## 2014-11-04 DIAGNOSIS — N183 Chronic kidney disease, stage 3 (moderate): Secondary | ICD-10-CM | POA: Diagnosis not present

## 2014-11-04 DIAGNOSIS — L03113 Cellulitis of right upper limb: Secondary | ICD-10-CM | POA: Diagnosis not present

## 2014-11-04 DIAGNOSIS — I129 Hypertensive chronic kidney disease with stage 1 through stage 4 chronic kidney disease, or unspecified chronic kidney disease: Secondary | ICD-10-CM | POA: Diagnosis not present

## 2014-11-04 DIAGNOSIS — E1165 Type 2 diabetes mellitus with hyperglycemia: Secondary | ICD-10-CM | POA: Diagnosis not present

## 2014-11-04 DIAGNOSIS — M659 Synovitis and tenosynovitis, unspecified: Secondary | ICD-10-CM | POA: Diagnosis not present

## 2014-11-04 DIAGNOSIS — Z794 Long term (current) use of insulin: Secondary | ICD-10-CM | POA: Diagnosis not present

## 2014-11-08 DIAGNOSIS — E1165 Type 2 diabetes mellitus with hyperglycemia: Secondary | ICD-10-CM | POA: Diagnosis not present

## 2014-11-08 DIAGNOSIS — N183 Chronic kidney disease, stage 3 (moderate): Secondary | ICD-10-CM | POA: Diagnosis not present

## 2014-11-08 DIAGNOSIS — I129 Hypertensive chronic kidney disease with stage 1 through stage 4 chronic kidney disease, or unspecified chronic kidney disease: Secondary | ICD-10-CM | POA: Diagnosis not present

## 2014-11-08 DIAGNOSIS — L03113 Cellulitis of right upper limb: Secondary | ICD-10-CM | POA: Diagnosis not present

## 2014-11-08 DIAGNOSIS — M659 Synovitis and tenosynovitis, unspecified: Secondary | ICD-10-CM | POA: Diagnosis not present

## 2014-11-08 DIAGNOSIS — Z794 Long term (current) use of insulin: Secondary | ICD-10-CM | POA: Diagnosis not present

## 2014-11-11 DIAGNOSIS — E1165 Type 2 diabetes mellitus with hyperglycemia: Secondary | ICD-10-CM | POA: Diagnosis not present

## 2014-11-11 DIAGNOSIS — N183 Chronic kidney disease, stage 3 (moderate): Secondary | ICD-10-CM | POA: Diagnosis not present

## 2014-11-11 DIAGNOSIS — Z7982 Long term (current) use of aspirin: Secondary | ICD-10-CM | POA: Diagnosis not present

## 2014-11-11 DIAGNOSIS — E780 Pure hypercholesterolemia: Secondary | ICD-10-CM | POA: Diagnosis not present

## 2014-11-11 DIAGNOSIS — I509 Heart failure, unspecified: Secondary | ICD-10-CM | POA: Diagnosis not present

## 2014-11-11 DIAGNOSIS — E119 Type 2 diabetes mellitus without complications: Secondary | ICD-10-CM | POA: Diagnosis not present

## 2014-11-11 DIAGNOSIS — I2510 Atherosclerotic heart disease of native coronary artery without angina pectoris: Secondary | ICD-10-CM | POA: Diagnosis not present

## 2014-11-11 DIAGNOSIS — N19 Unspecified kidney failure: Secondary | ICD-10-CM | POA: Diagnosis not present

## 2014-11-11 DIAGNOSIS — L03113 Cellulitis of right upper limb: Secondary | ICD-10-CM | POA: Diagnosis not present

## 2014-11-11 DIAGNOSIS — Z1389 Encounter for screening for other disorder: Secondary | ICD-10-CM | POA: Diagnosis not present

## 2014-11-11 DIAGNOSIS — M25521 Pain in right elbow: Secondary | ICD-10-CM | POA: Diagnosis not present

## 2014-11-11 DIAGNOSIS — I10 Essential (primary) hypertension: Secondary | ICD-10-CM | POA: Diagnosis not present

## 2014-11-11 DIAGNOSIS — Z719 Counseling, unspecified: Secondary | ICD-10-CM | POA: Diagnosis not present

## 2014-11-11 DIAGNOSIS — M659 Synovitis and tenosynovitis, unspecified: Secondary | ICD-10-CM | POA: Diagnosis not present

## 2014-11-11 DIAGNOSIS — I129 Hypertensive chronic kidney disease with stage 1 through stage 4 chronic kidney disease, or unspecified chronic kidney disease: Secondary | ICD-10-CM | POA: Diagnosis not present

## 2014-11-11 DIAGNOSIS — Z792 Long term (current) use of antibiotics: Secondary | ICD-10-CM | POA: Diagnosis not present

## 2014-11-11 DIAGNOSIS — L089 Local infection of the skin and subcutaneous tissue, unspecified: Secondary | ICD-10-CM | POA: Diagnosis not present

## 2014-11-11 DIAGNOSIS — Z794 Long term (current) use of insulin: Secondary | ICD-10-CM | POA: Diagnosis not present

## 2014-11-11 DIAGNOSIS — J45909 Unspecified asthma, uncomplicated: Secondary | ICD-10-CM | POA: Diagnosis not present

## 2014-11-15 DIAGNOSIS — N183 Chronic kidney disease, stage 3 (moderate): Secondary | ICD-10-CM | POA: Diagnosis not present

## 2014-11-15 DIAGNOSIS — Z794 Long term (current) use of insulin: Secondary | ICD-10-CM | POA: Diagnosis not present

## 2014-11-15 DIAGNOSIS — E1165 Type 2 diabetes mellitus with hyperglycemia: Secondary | ICD-10-CM | POA: Diagnosis not present

## 2014-11-15 DIAGNOSIS — L03113 Cellulitis of right upper limb: Secondary | ICD-10-CM | POA: Diagnosis not present

## 2014-11-15 DIAGNOSIS — I129 Hypertensive chronic kidney disease with stage 1 through stage 4 chronic kidney disease, or unspecified chronic kidney disease: Secondary | ICD-10-CM | POA: Diagnosis not present

## 2014-11-15 DIAGNOSIS — M659 Synovitis and tenosynovitis, unspecified: Secondary | ICD-10-CM | POA: Diagnosis not present

## 2014-11-18 DIAGNOSIS — L03113 Cellulitis of right upper limb: Secondary | ICD-10-CM | POA: Diagnosis not present

## 2014-11-18 DIAGNOSIS — I129 Hypertensive chronic kidney disease with stage 1 through stage 4 chronic kidney disease, or unspecified chronic kidney disease: Secondary | ICD-10-CM | POA: Diagnosis not present

## 2014-11-18 DIAGNOSIS — E1165 Type 2 diabetes mellitus with hyperglycemia: Secondary | ICD-10-CM | POA: Diagnosis not present

## 2014-11-18 DIAGNOSIS — Z794 Long term (current) use of insulin: Secondary | ICD-10-CM | POA: Diagnosis not present

## 2014-11-18 DIAGNOSIS — M659 Synovitis and tenosynovitis, unspecified: Secondary | ICD-10-CM | POA: Diagnosis not present

## 2014-11-18 DIAGNOSIS — N183 Chronic kidney disease, stage 3 (moderate): Secondary | ICD-10-CM | POA: Diagnosis not present

## 2014-11-19 DIAGNOSIS — L03113 Cellulitis of right upper limb: Secondary | ICD-10-CM | POA: Diagnosis not present

## 2014-11-19 DIAGNOSIS — M659 Synovitis and tenosynovitis, unspecified: Secondary | ICD-10-CM | POA: Diagnosis not present

## 2014-11-19 DIAGNOSIS — E1165 Type 2 diabetes mellitus with hyperglycemia: Secondary | ICD-10-CM | POA: Diagnosis not present

## 2014-11-19 DIAGNOSIS — Z794 Long term (current) use of insulin: Secondary | ICD-10-CM | POA: Diagnosis not present

## 2014-11-19 DIAGNOSIS — I129 Hypertensive chronic kidney disease with stage 1 through stage 4 chronic kidney disease, or unspecified chronic kidney disease: Secondary | ICD-10-CM | POA: Diagnosis not present

## 2014-11-19 DIAGNOSIS — N183 Chronic kidney disease, stage 3 (moderate): Secondary | ICD-10-CM | POA: Diagnosis not present

## 2014-11-22 DIAGNOSIS — M659 Synovitis and tenosynovitis, unspecified: Secondary | ICD-10-CM | POA: Diagnosis not present

## 2014-11-22 DIAGNOSIS — I509 Heart failure, unspecified: Secondary | ICD-10-CM | POA: Diagnosis not present

## 2014-11-22 DIAGNOSIS — I10 Essential (primary) hypertension: Secondary | ICD-10-CM | POA: Diagnosis not present

## 2014-11-22 DIAGNOSIS — Z794 Long term (current) use of insulin: Secondary | ICD-10-CM | POA: Diagnosis not present

## 2014-11-22 DIAGNOSIS — J9809 Other diseases of bronchus, not elsewhere classified: Secondary | ICD-10-CM | POA: Diagnosis not present

## 2014-11-22 DIAGNOSIS — I2510 Atherosclerotic heart disease of native coronary artery without angina pectoris: Secondary | ICD-10-CM | POA: Diagnosis not present

## 2014-11-22 DIAGNOSIS — Z7982 Long term (current) use of aspirin: Secondary | ICD-10-CM | POA: Diagnosis not present

## 2014-11-22 DIAGNOSIS — R55 Syncope and collapse: Secondary | ICD-10-CM | POA: Diagnosis not present

## 2014-11-22 DIAGNOSIS — S0990XA Unspecified injury of head, initial encounter: Secondary | ICD-10-CM | POA: Diagnosis not present

## 2014-11-22 DIAGNOSIS — I951 Orthostatic hypotension: Secondary | ICD-10-CM | POA: Diagnosis not present

## 2014-11-22 DIAGNOSIS — E780 Pure hypercholesterolemia: Secondary | ICD-10-CM | POA: Diagnosis not present

## 2014-11-22 DIAGNOSIS — R079 Chest pain, unspecified: Secondary | ICD-10-CM | POA: Diagnosis not present

## 2014-11-22 DIAGNOSIS — N183 Chronic kidney disease, stage 3 (moderate): Secondary | ICD-10-CM | POA: Diagnosis not present

## 2014-11-22 DIAGNOSIS — L03113 Cellulitis of right upper limb: Secondary | ICD-10-CM | POA: Diagnosis not present

## 2014-11-22 DIAGNOSIS — E860 Dehydration: Secondary | ICD-10-CM | POA: Diagnosis not present

## 2014-11-22 DIAGNOSIS — I517 Cardiomegaly: Secondary | ICD-10-CM | POA: Diagnosis not present

## 2014-11-22 DIAGNOSIS — E1165 Type 2 diabetes mellitus with hyperglycemia: Secondary | ICD-10-CM | POA: Diagnosis not present

## 2014-11-22 DIAGNOSIS — E119 Type 2 diabetes mellitus without complications: Secondary | ICD-10-CM | POA: Diagnosis not present

## 2014-11-22 DIAGNOSIS — R42 Dizziness and giddiness: Secondary | ICD-10-CM | POA: Diagnosis not present

## 2014-11-22 DIAGNOSIS — I129 Hypertensive chronic kidney disease with stage 1 through stage 4 chronic kidney disease, or unspecified chronic kidney disease: Secondary | ICD-10-CM | POA: Diagnosis not present

## 2014-11-22 DIAGNOSIS — R071 Chest pain on breathing: Secondary | ICD-10-CM | POA: Diagnosis not present

## 2014-11-25 DIAGNOSIS — I129 Hypertensive chronic kidney disease with stage 1 through stage 4 chronic kidney disease, or unspecified chronic kidney disease: Secondary | ICD-10-CM | POA: Diagnosis not present

## 2014-11-25 DIAGNOSIS — E1165 Type 2 diabetes mellitus with hyperglycemia: Secondary | ICD-10-CM | POA: Diagnosis not present

## 2014-11-25 DIAGNOSIS — L03113 Cellulitis of right upper limb: Secondary | ICD-10-CM | POA: Diagnosis not present

## 2014-11-25 DIAGNOSIS — M659 Synovitis and tenosynovitis, unspecified: Secondary | ICD-10-CM | POA: Diagnosis not present

## 2014-11-25 DIAGNOSIS — Z794 Long term (current) use of insulin: Secondary | ICD-10-CM | POA: Diagnosis not present

## 2014-11-25 DIAGNOSIS — N183 Chronic kidney disease, stage 3 (moderate): Secondary | ICD-10-CM | POA: Diagnosis not present

## 2014-11-30 DIAGNOSIS — I129 Hypertensive chronic kidney disease with stage 1 through stage 4 chronic kidney disease, or unspecified chronic kidney disease: Secondary | ICD-10-CM | POA: Diagnosis not present

## 2014-11-30 DIAGNOSIS — Z794 Long term (current) use of insulin: Secondary | ICD-10-CM | POA: Diagnosis not present

## 2014-11-30 DIAGNOSIS — N183 Chronic kidney disease, stage 3 (moderate): Secondary | ICD-10-CM | POA: Diagnosis not present

## 2014-11-30 DIAGNOSIS — E1165 Type 2 diabetes mellitus with hyperglycemia: Secondary | ICD-10-CM | POA: Diagnosis not present

## 2014-11-30 DIAGNOSIS — M659 Synovitis and tenosynovitis, unspecified: Secondary | ICD-10-CM | POA: Diagnosis not present

## 2014-11-30 DIAGNOSIS — L03113 Cellulitis of right upper limb: Secondary | ICD-10-CM | POA: Diagnosis not present

## 2014-12-06 DIAGNOSIS — E1165 Type 2 diabetes mellitus with hyperglycemia: Secondary | ICD-10-CM | POA: Diagnosis not present

## 2014-12-06 DIAGNOSIS — L03113 Cellulitis of right upper limb: Secondary | ICD-10-CM | POA: Diagnosis not present

## 2014-12-06 DIAGNOSIS — N183 Chronic kidney disease, stage 3 (moderate): Secondary | ICD-10-CM | POA: Diagnosis not present

## 2014-12-06 DIAGNOSIS — I129 Hypertensive chronic kidney disease with stage 1 through stage 4 chronic kidney disease, or unspecified chronic kidney disease: Secondary | ICD-10-CM | POA: Diagnosis not present

## 2014-12-06 DIAGNOSIS — Z794 Long term (current) use of insulin: Secondary | ICD-10-CM | POA: Diagnosis not present

## 2014-12-06 DIAGNOSIS — M659 Synovitis and tenosynovitis, unspecified: Secondary | ICD-10-CM | POA: Diagnosis not present

## 2014-12-08 DIAGNOSIS — Z79899 Other long term (current) drug therapy: Secondary | ICD-10-CM | POA: Diagnosis not present

## 2014-12-08 DIAGNOSIS — N183 Chronic kidney disease, stage 3 (moderate): Secondary | ICD-10-CM | POA: Diagnosis not present

## 2014-12-08 DIAGNOSIS — E118 Type 2 diabetes mellitus with unspecified complications: Secondary | ICD-10-CM | POA: Diagnosis not present

## 2014-12-08 DIAGNOSIS — E780 Pure hypercholesterolemia: Secondary | ICD-10-CM | POA: Diagnosis not present

## 2014-12-08 DIAGNOSIS — L03113 Cellulitis of right upper limb: Secondary | ICD-10-CM | POA: Diagnosis not present

## 2014-12-10 DIAGNOSIS — I110 Hypertensive heart disease with heart failure: Secondary | ICD-10-CM | POA: Diagnosis not present

## 2014-12-10 DIAGNOSIS — E118 Type 2 diabetes mellitus with unspecified complications: Secondary | ICD-10-CM | POA: Diagnosis not present

## 2014-12-10 DIAGNOSIS — G629 Polyneuropathy, unspecified: Secondary | ICD-10-CM | POA: Diagnosis not present

## 2014-12-10 DIAGNOSIS — E782 Mixed hyperlipidemia: Secondary | ICD-10-CM | POA: Diagnosis not present

## 2014-12-11 DIAGNOSIS — E1165 Type 2 diabetes mellitus with hyperglycemia: Secondary | ICD-10-CM | POA: Diagnosis not present

## 2014-12-11 DIAGNOSIS — L03113 Cellulitis of right upper limb: Secondary | ICD-10-CM | POA: Diagnosis not present

## 2014-12-11 DIAGNOSIS — M659 Synovitis and tenosynovitis, unspecified: Secondary | ICD-10-CM | POA: Diagnosis not present

## 2014-12-11 DIAGNOSIS — N183 Chronic kidney disease, stage 3 (moderate): Secondary | ICD-10-CM | POA: Diagnosis not present

## 2014-12-11 DIAGNOSIS — I129 Hypertensive chronic kidney disease with stage 1 through stage 4 chronic kidney disease, or unspecified chronic kidney disease: Secondary | ICD-10-CM | POA: Diagnosis not present

## 2014-12-11 DIAGNOSIS — Z794 Long term (current) use of insulin: Secondary | ICD-10-CM | POA: Diagnosis not present

## 2014-12-15 DIAGNOSIS — M659 Synovitis and tenosynovitis, unspecified: Secondary | ICD-10-CM | POA: Diagnosis not present

## 2014-12-15 DIAGNOSIS — L03113 Cellulitis of right upper limb: Secondary | ICD-10-CM | POA: Diagnosis not present

## 2014-12-15 DIAGNOSIS — Z794 Long term (current) use of insulin: Secondary | ICD-10-CM | POA: Diagnosis not present

## 2014-12-15 DIAGNOSIS — N183 Chronic kidney disease, stage 3 (moderate): Secondary | ICD-10-CM | POA: Diagnosis not present

## 2014-12-15 DIAGNOSIS — E1165 Type 2 diabetes mellitus with hyperglycemia: Secondary | ICD-10-CM | POA: Diagnosis not present

## 2014-12-15 DIAGNOSIS — I129 Hypertensive chronic kidney disease with stage 1 through stage 4 chronic kidney disease, or unspecified chronic kidney disease: Secondary | ICD-10-CM | POA: Diagnosis not present

## 2014-12-21 DIAGNOSIS — N183 Chronic kidney disease, stage 3 (moderate): Secondary | ICD-10-CM | POA: Diagnosis not present

## 2014-12-21 DIAGNOSIS — M659 Synovitis and tenosynovitis, unspecified: Secondary | ICD-10-CM | POA: Diagnosis not present

## 2014-12-21 DIAGNOSIS — I129 Hypertensive chronic kidney disease with stage 1 through stage 4 chronic kidney disease, or unspecified chronic kidney disease: Secondary | ICD-10-CM | POA: Diagnosis not present

## 2014-12-21 DIAGNOSIS — L03113 Cellulitis of right upper limb: Secondary | ICD-10-CM | POA: Diagnosis not present

## 2014-12-21 DIAGNOSIS — Z794 Long term (current) use of insulin: Secondary | ICD-10-CM | POA: Diagnosis not present

## 2014-12-21 DIAGNOSIS — E1165 Type 2 diabetes mellitus with hyperglycemia: Secondary | ICD-10-CM | POA: Diagnosis not present

## 2014-12-24 DIAGNOSIS — T879 Unspecified complications of amputation stump: Secondary | ICD-10-CM | POA: Diagnosis not present

## 2014-12-27 DIAGNOSIS — L03113 Cellulitis of right upper limb: Secondary | ICD-10-CM | POA: Diagnosis not present

## 2014-12-27 DIAGNOSIS — Z794 Long term (current) use of insulin: Secondary | ICD-10-CM | POA: Diagnosis not present

## 2014-12-27 DIAGNOSIS — N183 Chronic kidney disease, stage 3 (moderate): Secondary | ICD-10-CM | POA: Diagnosis not present

## 2014-12-27 DIAGNOSIS — I129 Hypertensive chronic kidney disease with stage 1 through stage 4 chronic kidney disease, or unspecified chronic kidney disease: Secondary | ICD-10-CM | POA: Diagnosis not present

## 2014-12-27 DIAGNOSIS — M659 Synovitis and tenosynovitis, unspecified: Secondary | ICD-10-CM | POA: Diagnosis not present

## 2014-12-27 DIAGNOSIS — E1165 Type 2 diabetes mellitus with hyperglycemia: Secondary | ICD-10-CM | POA: Diagnosis not present

## 2014-12-31 DIAGNOSIS — E782 Mixed hyperlipidemia: Secondary | ICD-10-CM | POA: Diagnosis not present

## 2014-12-31 DIAGNOSIS — N183 Chronic kidney disease, stage 3 (moderate): Secondary | ICD-10-CM | POA: Diagnosis not present

## 2014-12-31 DIAGNOSIS — E118 Type 2 diabetes mellitus with unspecified complications: Secondary | ICD-10-CM | POA: Diagnosis not present

## 2014-12-31 DIAGNOSIS — G629 Polyneuropathy, unspecified: Secondary | ICD-10-CM | POA: Diagnosis not present

## 2015-02-28 DIAGNOSIS — L03116 Cellulitis of left lower limb: Secondary | ICD-10-CM | POA: Diagnosis not present

## 2015-02-28 DIAGNOSIS — I739 Peripheral vascular disease, unspecified: Secondary | ICD-10-CM | POA: Diagnosis not present

## 2015-02-28 DIAGNOSIS — S81832A Puncture wound without foreign body, left lower leg, initial encounter: Secondary | ICD-10-CM | POA: Diagnosis not present

## 2015-02-28 DIAGNOSIS — G629 Polyneuropathy, unspecified: Secondary | ICD-10-CM | POA: Diagnosis not present

## 2015-03-01 DIAGNOSIS — Z86711 Personal history of pulmonary embolism: Secondary | ICD-10-CM | POA: Diagnosis not present

## 2015-03-01 DIAGNOSIS — I252 Old myocardial infarction: Secondary | ICD-10-CM | POA: Diagnosis not present

## 2015-03-01 DIAGNOSIS — Z7982 Long term (current) use of aspirin: Secondary | ICD-10-CM | POA: Diagnosis not present

## 2015-03-01 DIAGNOSIS — Z22322 Carrier or suspected carrier of Methicillin resistant Staphylococcus aureus: Secondary | ICD-10-CM | POA: Diagnosis not present

## 2015-03-01 DIAGNOSIS — I130 Hypertensive heart and chronic kidney disease with heart failure and stage 1 through stage 4 chronic kidney disease, or unspecified chronic kidney disease: Secondary | ICD-10-CM | POA: Diagnosis not present

## 2015-03-01 DIAGNOSIS — Z7901 Long term (current) use of anticoagulants: Secondary | ICD-10-CM | POA: Diagnosis not present

## 2015-03-01 DIAGNOSIS — Z89511 Acquired absence of right leg below knee: Secondary | ICD-10-CM | POA: Diagnosis not present

## 2015-03-01 DIAGNOSIS — Z452 Encounter for adjustment and management of vascular access device: Secondary | ICD-10-CM | POA: Diagnosis not present

## 2015-03-01 DIAGNOSIS — K219 Gastro-esophageal reflux disease without esophagitis: Secondary | ICD-10-CM | POA: Diagnosis present

## 2015-03-01 DIAGNOSIS — L03116 Cellulitis of left lower limb: Secondary | ICD-10-CM | POA: Diagnosis not present

## 2015-03-01 DIAGNOSIS — E875 Hyperkalemia: Secondary | ICD-10-CM | POA: Diagnosis not present

## 2015-03-01 DIAGNOSIS — I6523 Occlusion and stenosis of bilateral carotid arteries: Secondary | ICD-10-CM | POA: Diagnosis not present

## 2015-03-01 DIAGNOSIS — I440 Atrioventricular block, first degree: Secondary | ICD-10-CM | POA: Diagnosis not present

## 2015-03-01 DIAGNOSIS — Z87891 Personal history of nicotine dependence: Secondary | ICD-10-CM | POA: Diagnosis not present

## 2015-03-01 DIAGNOSIS — E780 Pure hypercholesterolemia: Secondary | ICD-10-CM | POA: Diagnosis present

## 2015-03-01 DIAGNOSIS — Z79899 Other long term (current) drug therapy: Secondary | ICD-10-CM | POA: Diagnosis not present

## 2015-03-01 DIAGNOSIS — N182 Chronic kidney disease, stage 2 (mild): Secondary | ICD-10-CM | POA: Diagnosis present

## 2015-03-01 DIAGNOSIS — E1151 Type 2 diabetes mellitus with diabetic peripheral angiopathy without gangrene: Secondary | ICD-10-CM | POA: Diagnosis not present

## 2015-03-01 DIAGNOSIS — Z951 Presence of aortocoronary bypass graft: Secondary | ICD-10-CM | POA: Diagnosis not present

## 2015-03-01 DIAGNOSIS — I25810 Atherosclerosis of coronary artery bypass graft(s) without angina pectoris: Secondary | ICD-10-CM | POA: Diagnosis present

## 2015-03-01 DIAGNOSIS — L0390 Cellulitis, unspecified: Secondary | ICD-10-CM | POA: Diagnosis not present

## 2015-03-01 DIAGNOSIS — I2119 ST elevation (STEMI) myocardial infarction involving other coronary artery of inferior wall: Secondary | ICD-10-CM | POA: Diagnosis not present

## 2015-03-01 DIAGNOSIS — I119 Hypertensive heart disease without heart failure: Secondary | ICD-10-CM | POA: Diagnosis present

## 2015-03-01 DIAGNOSIS — I129 Hypertensive chronic kidney disease with stage 1 through stage 4 chronic kidney disease, or unspecified chronic kidney disease: Secondary | ICD-10-CM | POA: Diagnosis present

## 2015-03-01 DIAGNOSIS — T82838A Hemorrhage of vascular prosthetic devices, implants and grafts, initial encounter: Secondary | ICD-10-CM | POA: Diagnosis not present

## 2015-03-01 DIAGNOSIS — F418 Other specified anxiety disorders: Secondary | ICD-10-CM | POA: Diagnosis present

## 2015-03-01 DIAGNOSIS — N183 Chronic kidney disease, stage 3 (moderate): Secondary | ICD-10-CM | POA: Diagnosis not present

## 2015-03-01 DIAGNOSIS — I739 Peripheral vascular disease, unspecified: Secondary | ICD-10-CM | POA: Diagnosis not present

## 2015-03-01 DIAGNOSIS — Z880 Allergy status to penicillin: Secondary | ICD-10-CM | POA: Diagnosis not present

## 2015-03-01 DIAGNOSIS — E1165 Type 2 diabetes mellitus with hyperglycemia: Secondary | ICD-10-CM | POA: Diagnosis present

## 2015-03-01 DIAGNOSIS — M1990 Unspecified osteoarthritis, unspecified site: Secondary | ICD-10-CM | POA: Diagnosis present

## 2015-03-01 DIAGNOSIS — Z794 Long term (current) use of insulin: Secondary | ICD-10-CM | POA: Diagnosis not present

## 2015-03-01 DIAGNOSIS — I5022 Chronic systolic (congestive) heart failure: Secondary | ICD-10-CM | POA: Diagnosis not present

## 2015-03-01 DIAGNOSIS — R55 Syncope and collapse: Secondary | ICD-10-CM | POA: Diagnosis not present

## 2015-03-01 DIAGNOSIS — E119 Type 2 diabetes mellitus without complications: Secondary | ICD-10-CM | POA: Diagnosis not present

## 2015-03-05 DIAGNOSIS — Z87891 Personal history of nicotine dependence: Secondary | ICD-10-CM | POA: Diagnosis not present

## 2015-03-05 DIAGNOSIS — I130 Hypertensive heart and chronic kidney disease with heart failure and stage 1 through stage 4 chronic kidney disease, or unspecified chronic kidney disease: Secondary | ICD-10-CM | POA: Diagnosis not present

## 2015-03-05 DIAGNOSIS — T82838A Hemorrhage of vascular prosthetic devices, implants and grafts, initial encounter: Secondary | ICD-10-CM | POA: Diagnosis not present

## 2015-03-05 DIAGNOSIS — I5022 Chronic systolic (congestive) heart failure: Secondary | ICD-10-CM | POA: Diagnosis not present

## 2015-03-05 DIAGNOSIS — N183 Chronic kidney disease, stage 3 (moderate): Secondary | ICD-10-CM | POA: Diagnosis not present

## 2015-03-05 DIAGNOSIS — E119 Type 2 diabetes mellitus without complications: Secondary | ICD-10-CM | POA: Diagnosis not present

## 2015-03-06 DIAGNOSIS — E1151 Type 2 diabetes mellitus with diabetic peripheral angiopathy without gangrene: Secondary | ICD-10-CM | POA: Diagnosis not present

## 2015-03-06 DIAGNOSIS — J45909 Unspecified asthma, uncomplicated: Secondary | ICD-10-CM | POA: Diagnosis not present

## 2015-03-06 DIAGNOSIS — N189 Chronic kidney disease, unspecified: Secondary | ICD-10-CM | POA: Diagnosis not present

## 2015-03-06 DIAGNOSIS — I2510 Atherosclerotic heart disease of native coronary artery without angina pectoris: Secondary | ICD-10-CM | POA: Diagnosis not present

## 2015-03-06 DIAGNOSIS — L03116 Cellulitis of left lower limb: Secondary | ICD-10-CM | POA: Diagnosis not present

## 2015-03-06 DIAGNOSIS — I10 Essential (primary) hypertension: Secondary | ICD-10-CM | POA: Diagnosis not present

## 2015-03-07 DIAGNOSIS — N189 Chronic kidney disease, unspecified: Secondary | ICD-10-CM | POA: Diagnosis not present

## 2015-03-07 DIAGNOSIS — L03116 Cellulitis of left lower limb: Secondary | ICD-10-CM | POA: Diagnosis not present

## 2015-03-07 DIAGNOSIS — J45909 Unspecified asthma, uncomplicated: Secondary | ICD-10-CM | POA: Diagnosis not present

## 2015-03-07 DIAGNOSIS — E1151 Type 2 diabetes mellitus with diabetic peripheral angiopathy without gangrene: Secondary | ICD-10-CM | POA: Diagnosis not present

## 2015-03-07 DIAGNOSIS — I2510 Atherosclerotic heart disease of native coronary artery without angina pectoris: Secondary | ICD-10-CM | POA: Diagnosis not present

## 2015-03-07 DIAGNOSIS — I10 Essential (primary) hypertension: Secondary | ICD-10-CM | POA: Diagnosis not present

## 2015-03-09 DIAGNOSIS — Z86711 Personal history of pulmonary embolism: Secondary | ICD-10-CM | POA: Diagnosis not present

## 2015-03-09 DIAGNOSIS — Z79899 Other long term (current) drug therapy: Secondary | ICD-10-CM | POA: Diagnosis not present

## 2015-03-09 DIAGNOSIS — E1142 Type 2 diabetes mellitus with diabetic polyneuropathy: Secondary | ICD-10-CM | POA: Diagnosis not present

## 2015-03-09 DIAGNOSIS — J45909 Unspecified asthma, uncomplicated: Secondary | ICD-10-CM | POA: Diagnosis not present

## 2015-03-09 DIAGNOSIS — J22 Unspecified acute lower respiratory infection: Secondary | ICD-10-CM | POA: Diagnosis not present

## 2015-03-09 DIAGNOSIS — I25810 Atherosclerosis of coronary artery bypass graft(s) without angina pectoris: Secondary | ICD-10-CM | POA: Diagnosis present

## 2015-03-09 DIAGNOSIS — I2510 Atherosclerotic heart disease of native coronary artery without angina pectoris: Secondary | ICD-10-CM | POA: Diagnosis not present

## 2015-03-09 DIAGNOSIS — Z880 Allergy status to penicillin: Secondary | ICD-10-CM | POA: Diagnosis not present

## 2015-03-09 DIAGNOSIS — I5022 Chronic systolic (congestive) heart failure: Secondary | ICD-10-CM | POA: Diagnosis not present

## 2015-03-09 DIAGNOSIS — Z794 Long term (current) use of insulin: Secondary | ICD-10-CM | POA: Diagnosis not present

## 2015-03-09 DIAGNOSIS — Z89511 Acquired absence of right leg below knee: Secondary | ICD-10-CM | POA: Diagnosis not present

## 2015-03-09 DIAGNOSIS — S0990XA Unspecified injury of head, initial encounter: Secondary | ICD-10-CM | POA: Diagnosis not present

## 2015-03-09 DIAGNOSIS — E871 Hypo-osmolality and hyponatremia: Secondary | ICD-10-CM | POA: Diagnosis not present

## 2015-03-09 DIAGNOSIS — Z7982 Long term (current) use of aspirin: Secondary | ICD-10-CM | POA: Diagnosis not present

## 2015-03-09 DIAGNOSIS — E1151 Type 2 diabetes mellitus with diabetic peripheral angiopathy without gangrene: Secondary | ICD-10-CM | POA: Diagnosis present

## 2015-03-09 DIAGNOSIS — L03116 Cellulitis of left lower limb: Secondary | ICD-10-CM | POA: Diagnosis not present

## 2015-03-09 DIAGNOSIS — N189 Chronic kidney disease, unspecified: Secondary | ICD-10-CM | POA: Diagnosis present

## 2015-03-09 DIAGNOSIS — F418 Other specified anxiety disorders: Secondary | ICD-10-CM | POA: Diagnosis present

## 2015-03-09 DIAGNOSIS — N144 Toxic nephropathy, not elsewhere classified: Secondary | ICD-10-CM | POA: Diagnosis not present

## 2015-03-09 DIAGNOSIS — I129 Hypertensive chronic kidney disease with stage 1 through stage 4 chronic kidney disease, or unspecified chronic kidney disease: Secondary | ICD-10-CM | POA: Diagnosis present

## 2015-03-09 DIAGNOSIS — N179 Acute kidney failure, unspecified: Secondary | ICD-10-CM | POA: Diagnosis not present

## 2015-03-09 DIAGNOSIS — E780 Pure hypercholesterolemia: Secondary | ICD-10-CM | POA: Diagnosis present

## 2015-03-09 DIAGNOSIS — M1990 Unspecified osteoarthritis, unspecified site: Secondary | ICD-10-CM | POA: Diagnosis present

## 2015-03-09 DIAGNOSIS — K219 Gastro-esophageal reflux disease without esophagitis: Secondary | ICD-10-CM | POA: Diagnosis present

## 2015-03-09 DIAGNOSIS — Z8619 Personal history of other infectious and parasitic diseases: Secondary | ICD-10-CM | POA: Diagnosis not present

## 2015-03-09 DIAGNOSIS — I252 Old myocardial infarction: Secondary | ICD-10-CM | POA: Diagnosis not present

## 2015-03-09 DIAGNOSIS — I10 Essential (primary) hypertension: Secondary | ICD-10-CM | POA: Diagnosis not present

## 2015-03-09 DIAGNOSIS — R51 Headache: Secondary | ICD-10-CM | POA: Diagnosis not present

## 2015-03-09 DIAGNOSIS — Z951 Presence of aortocoronary bypass graft: Secondary | ICD-10-CM | POA: Diagnosis not present

## 2015-03-09 DIAGNOSIS — E785 Hyperlipidemia, unspecified: Secondary | ICD-10-CM | POA: Diagnosis present

## 2015-03-09 DIAGNOSIS — T368X1A Poisoning by other systemic antibiotics, accidental (unintentional), initial encounter: Secondary | ICD-10-CM | POA: Diagnosis not present

## 2015-03-13 DIAGNOSIS — E1151 Type 2 diabetes mellitus with diabetic peripheral angiopathy without gangrene: Secondary | ICD-10-CM | POA: Diagnosis not present

## 2015-03-13 DIAGNOSIS — J45909 Unspecified asthma, uncomplicated: Secondary | ICD-10-CM | POA: Diagnosis not present

## 2015-03-13 DIAGNOSIS — N144 Toxic nephropathy, not elsewhere classified: Secondary | ICD-10-CM | POA: Diagnosis not present

## 2015-03-13 DIAGNOSIS — N189 Chronic kidney disease, unspecified: Secondary | ICD-10-CM | POA: Diagnosis not present

## 2015-03-13 DIAGNOSIS — I10 Essential (primary) hypertension: Secondary | ICD-10-CM | POA: Diagnosis not present

## 2015-03-13 DIAGNOSIS — T368X1A Poisoning by other systemic antibiotics, accidental (unintentional), initial encounter: Secondary | ICD-10-CM | POA: Diagnosis not present

## 2015-03-13 DIAGNOSIS — L03116 Cellulitis of left lower limb: Secondary | ICD-10-CM | POA: Diagnosis not present

## 2015-03-13 DIAGNOSIS — I2510 Atherosclerotic heart disease of native coronary artery without angina pectoris: Secondary | ICD-10-CM | POA: Diagnosis not present

## 2015-03-16 DIAGNOSIS — I10 Essential (primary) hypertension: Secondary | ICD-10-CM | POA: Diagnosis not present

## 2015-03-16 DIAGNOSIS — J45909 Unspecified asthma, uncomplicated: Secondary | ICD-10-CM | POA: Diagnosis not present

## 2015-03-16 DIAGNOSIS — E1151 Type 2 diabetes mellitus with diabetic peripheral angiopathy without gangrene: Secondary | ICD-10-CM | POA: Diagnosis not present

## 2015-03-16 DIAGNOSIS — I2510 Atherosclerotic heart disease of native coronary artery without angina pectoris: Secondary | ICD-10-CM | POA: Diagnosis not present

## 2015-03-16 DIAGNOSIS — N189 Chronic kidney disease, unspecified: Secondary | ICD-10-CM | POA: Diagnosis not present

## 2015-03-16 DIAGNOSIS — L03116 Cellulitis of left lower limb: Secondary | ICD-10-CM | POA: Diagnosis not present

## 2015-03-17 DIAGNOSIS — E782 Mixed hyperlipidemia: Secondary | ICD-10-CM | POA: Diagnosis not present

## 2015-03-17 DIAGNOSIS — I739 Peripheral vascular disease, unspecified: Secondary | ICD-10-CM | POA: Diagnosis not present

## 2015-03-17 DIAGNOSIS — E118 Type 2 diabetes mellitus with unspecified complications: Secondary | ICD-10-CM | POA: Diagnosis not present

## 2015-03-17 DIAGNOSIS — L03116 Cellulitis of left lower limb: Secondary | ICD-10-CM | POA: Diagnosis not present

## 2015-03-21 DIAGNOSIS — N189 Chronic kidney disease, unspecified: Secondary | ICD-10-CM | POA: Diagnosis not present

## 2015-03-21 DIAGNOSIS — I2510 Atherosclerotic heart disease of native coronary artery without angina pectoris: Secondary | ICD-10-CM | POA: Diagnosis not present

## 2015-03-21 DIAGNOSIS — J45909 Unspecified asthma, uncomplicated: Secondary | ICD-10-CM | POA: Diagnosis not present

## 2015-03-21 DIAGNOSIS — L03116 Cellulitis of left lower limb: Secondary | ICD-10-CM | POA: Diagnosis not present

## 2015-03-21 DIAGNOSIS — I10 Essential (primary) hypertension: Secondary | ICD-10-CM | POA: Diagnosis not present

## 2015-03-21 DIAGNOSIS — E1151 Type 2 diabetes mellitus with diabetic peripheral angiopathy without gangrene: Secondary | ICD-10-CM | POA: Diagnosis not present

## 2015-04-13 DIAGNOSIS — I739 Peripheral vascular disease, unspecified: Secondary | ICD-10-CM | POA: Diagnosis not present

## 2015-04-13 DIAGNOSIS — Z0000 Encounter for general adult medical examination without abnormal findings: Secondary | ICD-10-CM | POA: Diagnosis not present

## 2015-04-13 DIAGNOSIS — Z23 Encounter for immunization: Secondary | ICD-10-CM | POA: Diagnosis not present

## 2015-04-13 DIAGNOSIS — E118 Type 2 diabetes mellitus with unspecified complications: Secondary | ICD-10-CM | POA: Diagnosis not present

## 2015-04-13 DIAGNOSIS — E782 Mixed hyperlipidemia: Secondary | ICD-10-CM | POA: Diagnosis not present

## 2015-04-13 DIAGNOSIS — I110 Hypertensive heart disease with heart failure: Secondary | ICD-10-CM | POA: Diagnosis not present

## 2015-04-30 DIAGNOSIS — M79672 Pain in left foot: Secondary | ICD-10-CM | POA: Diagnosis not present

## 2015-04-30 DIAGNOSIS — E11621 Type 2 diabetes mellitus with foot ulcer: Secondary | ICD-10-CM | POA: Diagnosis not present

## 2015-04-30 DIAGNOSIS — L97529 Non-pressure chronic ulcer of other part of left foot with unspecified severity: Secondary | ICD-10-CM | POA: Diagnosis not present

## 2015-04-30 DIAGNOSIS — E11622 Type 2 diabetes mellitus with other skin ulcer: Secondary | ICD-10-CM | POA: Diagnosis not present

## 2015-05-03 DIAGNOSIS — Z23 Encounter for immunization: Secondary | ICD-10-CM | POA: Diagnosis not present

## 2015-05-03 DIAGNOSIS — Z86711 Personal history of pulmonary embolism: Secondary | ICD-10-CM | POA: Diagnosis not present

## 2015-05-03 DIAGNOSIS — Z87891 Personal history of nicotine dependence: Secondary | ICD-10-CM | POA: Diagnosis not present

## 2015-05-03 DIAGNOSIS — I25810 Atherosclerosis of coronary artery bypass graft(s) without angina pectoris: Secondary | ICD-10-CM | POA: Diagnosis present

## 2015-05-03 DIAGNOSIS — Z79899 Other long term (current) drug therapy: Secondary | ICD-10-CM | POA: Diagnosis not present

## 2015-05-03 DIAGNOSIS — F418 Other specified anxiety disorders: Secondary | ICD-10-CM | POA: Diagnosis present

## 2015-05-03 DIAGNOSIS — Z880 Allergy status to penicillin: Secondary | ICD-10-CM | POA: Diagnosis not present

## 2015-05-03 DIAGNOSIS — E11621 Type 2 diabetes mellitus with foot ulcer: Secondary | ICD-10-CM | POA: Diagnosis not present

## 2015-05-03 DIAGNOSIS — I252 Old myocardial infarction: Secondary | ICD-10-CM | POA: Diagnosis not present

## 2015-05-03 DIAGNOSIS — M1990 Unspecified osteoarthritis, unspecified site: Secondary | ICD-10-CM | POA: Diagnosis present

## 2015-05-03 DIAGNOSIS — I129 Hypertensive chronic kidney disease with stage 1 through stage 4 chronic kidney disease, or unspecified chronic kidney disease: Secondary | ICD-10-CM | POA: Diagnosis present

## 2015-05-03 DIAGNOSIS — E1151 Type 2 diabetes mellitus with diabetic peripheral angiopathy without gangrene: Secondary | ICD-10-CM | POA: Diagnosis present

## 2015-05-03 DIAGNOSIS — L97529 Non-pressure chronic ulcer of other part of left foot with unspecified severity: Secondary | ICD-10-CM | POA: Diagnosis not present

## 2015-05-03 DIAGNOSIS — N179 Acute kidney failure, unspecified: Secondary | ICD-10-CM | POA: Diagnosis present

## 2015-05-03 DIAGNOSIS — Z7982 Long term (current) use of aspirin: Secondary | ICD-10-CM | POA: Diagnosis not present

## 2015-05-03 DIAGNOSIS — M65072 Abscess of tendon sheath, left ankle and foot: Secondary | ICD-10-CM | POA: Diagnosis present

## 2015-05-03 DIAGNOSIS — N183 Chronic kidney disease, stage 3 (moderate): Secondary | ICD-10-CM | POA: Diagnosis present

## 2015-05-03 DIAGNOSIS — I4892 Unspecified atrial flutter: Secondary | ICD-10-CM | POA: Diagnosis not present

## 2015-05-03 DIAGNOSIS — L02612 Cutaneous abscess of left foot: Secondary | ICD-10-CM | POA: Diagnosis not present

## 2015-05-03 DIAGNOSIS — E1142 Type 2 diabetes mellitus with diabetic polyneuropathy: Secondary | ICD-10-CM | POA: Diagnosis not present

## 2015-05-03 DIAGNOSIS — Z89511 Acquired absence of right leg below knee: Secondary | ICD-10-CM | POA: Diagnosis not present

## 2015-05-03 DIAGNOSIS — L03116 Cellulitis of left lower limb: Secondary | ICD-10-CM | POA: Diagnosis not present

## 2015-05-03 DIAGNOSIS — Z794 Long term (current) use of insulin: Secondary | ICD-10-CM | POA: Diagnosis not present

## 2015-05-03 DIAGNOSIS — Z951 Presence of aortocoronary bypass graft: Secondary | ICD-10-CM | POA: Diagnosis not present

## 2015-05-06 DIAGNOSIS — L97529 Non-pressure chronic ulcer of other part of left foot with unspecified severity: Secondary | ICD-10-CM | POA: Diagnosis not present

## 2015-05-06 DIAGNOSIS — I4892 Unspecified atrial flutter: Secondary | ICD-10-CM | POA: Diagnosis not present

## 2015-05-06 DIAGNOSIS — E11621 Type 2 diabetes mellitus with foot ulcer: Secondary | ICD-10-CM | POA: Diagnosis not present

## 2015-05-11 DIAGNOSIS — N179 Acute kidney failure, unspecified: Secondary | ICD-10-CM | POA: Diagnosis not present

## 2015-05-11 DIAGNOSIS — E1122 Type 2 diabetes mellitus with diabetic chronic kidney disease: Secondary | ICD-10-CM | POA: Diagnosis not present

## 2015-05-11 DIAGNOSIS — Z7982 Long term (current) use of aspirin: Secondary | ICD-10-CM | POA: Diagnosis not present

## 2015-05-11 DIAGNOSIS — L97521 Non-pressure chronic ulcer of other part of left foot limited to breakdown of skin: Secondary | ICD-10-CM | POA: Diagnosis not present

## 2015-05-11 DIAGNOSIS — Z4800 Encounter for change or removal of nonsurgical wound dressing: Secondary | ICD-10-CM | POA: Diagnosis not present

## 2015-05-11 DIAGNOSIS — L03032 Cellulitis of left toe: Secondary | ICD-10-CM | POA: Diagnosis not present

## 2015-05-11 DIAGNOSIS — Z794 Long term (current) use of insulin: Secondary | ICD-10-CM | POA: Diagnosis not present

## 2015-05-11 DIAGNOSIS — T368X5D Adverse effect of other systemic antibiotics, subsequent encounter: Secondary | ICD-10-CM | POA: Diagnosis not present

## 2015-05-11 DIAGNOSIS — Z89511 Acquired absence of right leg below knee: Secondary | ICD-10-CM | POA: Diagnosis not present

## 2015-05-11 DIAGNOSIS — Z7984 Long term (current) use of oral hypoglycemic drugs: Secondary | ICD-10-CM | POA: Diagnosis not present

## 2015-05-11 DIAGNOSIS — E1151 Type 2 diabetes mellitus with diabetic peripheral angiopathy without gangrene: Secondary | ICD-10-CM | POA: Diagnosis not present

## 2015-05-11 DIAGNOSIS — N183 Chronic kidney disease, stage 3 (moderate): Secondary | ICD-10-CM | POA: Diagnosis not present

## 2015-05-11 DIAGNOSIS — I129 Hypertensive chronic kidney disease with stage 1 through stage 4 chronic kidney disease, or unspecified chronic kidney disease: Secondary | ICD-10-CM | POA: Diagnosis not present

## 2015-05-11 DIAGNOSIS — E11621 Type 2 diabetes mellitus with foot ulcer: Secondary | ICD-10-CM | POA: Diagnosis not present

## 2015-05-11 DIAGNOSIS — E1142 Type 2 diabetes mellitus with diabetic polyneuropathy: Secondary | ICD-10-CM | POA: Diagnosis not present

## 2015-05-13 DIAGNOSIS — I5022 Chronic systolic (congestive) heart failure: Secondary | ICD-10-CM | POA: Diagnosis not present

## 2015-05-13 DIAGNOSIS — S91332S Puncture wound without foreign body, left foot, sequela: Secondary | ICD-10-CM | POA: Diagnosis not present

## 2015-05-13 DIAGNOSIS — L03116 Cellulitis of left lower limb: Secondary | ICD-10-CM | POA: Diagnosis not present

## 2015-05-13 DIAGNOSIS — E118 Type 2 diabetes mellitus with unspecified complications: Secondary | ICD-10-CM | POA: Diagnosis not present

## 2015-05-14 DIAGNOSIS — N179 Acute kidney failure, unspecified: Secondary | ICD-10-CM | POA: Diagnosis not present

## 2015-05-14 DIAGNOSIS — T368X5D Adverse effect of other systemic antibiotics, subsequent encounter: Secondary | ICD-10-CM | POA: Diagnosis not present

## 2015-05-14 DIAGNOSIS — E1122 Type 2 diabetes mellitus with diabetic chronic kidney disease: Secondary | ICD-10-CM | POA: Diagnosis not present

## 2015-05-14 DIAGNOSIS — L97521 Non-pressure chronic ulcer of other part of left foot limited to breakdown of skin: Secondary | ICD-10-CM | POA: Diagnosis not present

## 2015-05-14 DIAGNOSIS — L03032 Cellulitis of left toe: Secondary | ICD-10-CM | POA: Diagnosis not present

## 2015-05-14 DIAGNOSIS — E11621 Type 2 diabetes mellitus with foot ulcer: Secondary | ICD-10-CM | POA: Diagnosis not present

## 2015-05-15 DIAGNOSIS — T368X5D Adverse effect of other systemic antibiotics, subsequent encounter: Secondary | ICD-10-CM | POA: Diagnosis not present

## 2015-05-15 DIAGNOSIS — E11621 Type 2 diabetes mellitus with foot ulcer: Secondary | ICD-10-CM | POA: Diagnosis not present

## 2015-05-15 DIAGNOSIS — L03032 Cellulitis of left toe: Secondary | ICD-10-CM | POA: Diagnosis not present

## 2015-05-15 DIAGNOSIS — E1122 Type 2 diabetes mellitus with diabetic chronic kidney disease: Secondary | ICD-10-CM | POA: Diagnosis not present

## 2015-05-15 DIAGNOSIS — N179 Acute kidney failure, unspecified: Secondary | ICD-10-CM | POA: Diagnosis not present

## 2015-05-15 DIAGNOSIS — L97521 Non-pressure chronic ulcer of other part of left foot limited to breakdown of skin: Secondary | ICD-10-CM | POA: Diagnosis not present

## 2015-05-17 DIAGNOSIS — T368X5D Adverse effect of other systemic antibiotics, subsequent encounter: Secondary | ICD-10-CM | POA: Diagnosis not present

## 2015-05-17 DIAGNOSIS — N179 Acute kidney failure, unspecified: Secondary | ICD-10-CM | POA: Diagnosis not present

## 2015-05-17 DIAGNOSIS — L97521 Non-pressure chronic ulcer of other part of left foot limited to breakdown of skin: Secondary | ICD-10-CM | POA: Diagnosis not present

## 2015-05-17 DIAGNOSIS — E1122 Type 2 diabetes mellitus with diabetic chronic kidney disease: Secondary | ICD-10-CM | POA: Diagnosis not present

## 2015-05-17 DIAGNOSIS — L03032 Cellulitis of left toe: Secondary | ICD-10-CM | POA: Diagnosis not present

## 2015-05-17 DIAGNOSIS — E11621 Type 2 diabetes mellitus with foot ulcer: Secondary | ICD-10-CM | POA: Diagnosis not present

## 2015-05-19 DIAGNOSIS — E1122 Type 2 diabetes mellitus with diabetic chronic kidney disease: Secondary | ICD-10-CM | POA: Diagnosis not present

## 2015-05-19 DIAGNOSIS — L97521 Non-pressure chronic ulcer of other part of left foot limited to breakdown of skin: Secondary | ICD-10-CM | POA: Diagnosis not present

## 2015-05-19 DIAGNOSIS — T368X5D Adverse effect of other systemic antibiotics, subsequent encounter: Secondary | ICD-10-CM | POA: Diagnosis not present

## 2015-05-19 DIAGNOSIS — E11621 Type 2 diabetes mellitus with foot ulcer: Secondary | ICD-10-CM | POA: Diagnosis not present

## 2015-05-19 DIAGNOSIS — L03032 Cellulitis of left toe: Secondary | ICD-10-CM | POA: Diagnosis not present

## 2015-05-19 DIAGNOSIS — N179 Acute kidney failure, unspecified: Secondary | ICD-10-CM | POA: Diagnosis not present

## 2015-05-24 DIAGNOSIS — E1122 Type 2 diabetes mellitus with diabetic chronic kidney disease: Secondary | ICD-10-CM | POA: Diagnosis not present

## 2015-05-24 DIAGNOSIS — T368X5D Adverse effect of other systemic antibiotics, subsequent encounter: Secondary | ICD-10-CM | POA: Diagnosis not present

## 2015-05-24 DIAGNOSIS — N179 Acute kidney failure, unspecified: Secondary | ICD-10-CM | POA: Diagnosis not present

## 2015-05-24 DIAGNOSIS — L97521 Non-pressure chronic ulcer of other part of left foot limited to breakdown of skin: Secondary | ICD-10-CM | POA: Diagnosis not present

## 2015-05-24 DIAGNOSIS — L03032 Cellulitis of left toe: Secondary | ICD-10-CM | POA: Diagnosis not present

## 2015-05-24 DIAGNOSIS — E11621 Type 2 diabetes mellitus with foot ulcer: Secondary | ICD-10-CM | POA: Diagnosis not present

## 2015-05-25 DIAGNOSIS — Z89521 Acquired absence of right knee: Secondary | ICD-10-CM | POA: Diagnosis not present

## 2015-05-25 DIAGNOSIS — E1142 Type 2 diabetes mellitus with diabetic polyneuropathy: Secondary | ICD-10-CM | POA: Diagnosis not present

## 2015-05-25 DIAGNOSIS — S91302D Unspecified open wound, left foot, subsequent encounter: Secondary | ICD-10-CM | POA: Diagnosis not present

## 2015-05-25 DIAGNOSIS — I739 Peripheral vascular disease, unspecified: Secondary | ICD-10-CM | POA: Diagnosis not present

## 2015-05-26 DIAGNOSIS — L97521 Non-pressure chronic ulcer of other part of left foot limited to breakdown of skin: Secondary | ICD-10-CM | POA: Diagnosis not present

## 2015-05-26 DIAGNOSIS — N179 Acute kidney failure, unspecified: Secondary | ICD-10-CM | POA: Diagnosis not present

## 2015-05-26 DIAGNOSIS — T368X5D Adverse effect of other systemic antibiotics, subsequent encounter: Secondary | ICD-10-CM | POA: Diagnosis not present

## 2015-05-26 DIAGNOSIS — L03032 Cellulitis of left toe: Secondary | ICD-10-CM | POA: Diagnosis not present

## 2015-05-26 DIAGNOSIS — E11621 Type 2 diabetes mellitus with foot ulcer: Secondary | ICD-10-CM | POA: Diagnosis not present

## 2015-05-26 DIAGNOSIS — E1122 Type 2 diabetes mellitus with diabetic chronic kidney disease: Secondary | ICD-10-CM | POA: Diagnosis not present

## 2015-05-31 DIAGNOSIS — T368X5D Adverse effect of other systemic antibiotics, subsequent encounter: Secondary | ICD-10-CM | POA: Diagnosis not present

## 2015-05-31 DIAGNOSIS — E11621 Type 2 diabetes mellitus with foot ulcer: Secondary | ICD-10-CM | POA: Diagnosis not present

## 2015-05-31 DIAGNOSIS — L97521 Non-pressure chronic ulcer of other part of left foot limited to breakdown of skin: Secondary | ICD-10-CM | POA: Diagnosis not present

## 2015-05-31 DIAGNOSIS — N179 Acute kidney failure, unspecified: Secondary | ICD-10-CM | POA: Diagnosis not present

## 2015-05-31 DIAGNOSIS — L03032 Cellulitis of left toe: Secondary | ICD-10-CM | POA: Diagnosis not present

## 2015-05-31 DIAGNOSIS — E1122 Type 2 diabetes mellitus with diabetic chronic kidney disease: Secondary | ICD-10-CM | POA: Diagnosis not present

## 2015-06-01 DIAGNOSIS — I110 Hypertensive heart disease with heart failure: Secondary | ICD-10-CM | POA: Diagnosis not present

## 2015-06-01 DIAGNOSIS — I5022 Chronic systolic (congestive) heart failure: Secondary | ICD-10-CM | POA: Diagnosis not present

## 2015-06-01 DIAGNOSIS — I739 Peripheral vascular disease, unspecified: Secondary | ICD-10-CM | POA: Diagnosis not present

## 2015-06-01 DIAGNOSIS — E118 Type 2 diabetes mellitus with unspecified complications: Secondary | ICD-10-CM | POA: Diagnosis not present

## 2015-06-01 DIAGNOSIS — D510 Vitamin B12 deficiency anemia due to intrinsic factor deficiency: Secondary | ICD-10-CM | POA: Diagnosis not present

## 2015-06-03 DIAGNOSIS — N179 Acute kidney failure, unspecified: Secondary | ICD-10-CM | POA: Diagnosis not present

## 2015-06-03 DIAGNOSIS — E1122 Type 2 diabetes mellitus with diabetic chronic kidney disease: Secondary | ICD-10-CM | POA: Diagnosis not present

## 2015-06-03 DIAGNOSIS — T368X5D Adverse effect of other systemic antibiotics, subsequent encounter: Secondary | ICD-10-CM | POA: Diagnosis not present

## 2015-06-03 DIAGNOSIS — L97521 Non-pressure chronic ulcer of other part of left foot limited to breakdown of skin: Secondary | ICD-10-CM | POA: Diagnosis not present

## 2015-06-03 DIAGNOSIS — L03032 Cellulitis of left toe: Secondary | ICD-10-CM | POA: Diagnosis not present

## 2015-06-03 DIAGNOSIS — E11621 Type 2 diabetes mellitus with foot ulcer: Secondary | ICD-10-CM | POA: Diagnosis not present

## 2015-06-07 DIAGNOSIS — L97521 Non-pressure chronic ulcer of other part of left foot limited to breakdown of skin: Secondary | ICD-10-CM | POA: Diagnosis not present

## 2015-06-07 DIAGNOSIS — L03032 Cellulitis of left toe: Secondary | ICD-10-CM | POA: Diagnosis not present

## 2015-06-07 DIAGNOSIS — E11621 Type 2 diabetes mellitus with foot ulcer: Secondary | ICD-10-CM | POA: Diagnosis not present

## 2015-06-07 DIAGNOSIS — N179 Acute kidney failure, unspecified: Secondary | ICD-10-CM | POA: Diagnosis not present

## 2015-06-07 DIAGNOSIS — E1122 Type 2 diabetes mellitus with diabetic chronic kidney disease: Secondary | ICD-10-CM | POA: Diagnosis not present

## 2015-06-07 DIAGNOSIS — T368X5D Adverse effect of other systemic antibiotics, subsequent encounter: Secondary | ICD-10-CM | POA: Diagnosis not present

## 2015-06-14 DIAGNOSIS — L97521 Non-pressure chronic ulcer of other part of left foot limited to breakdown of skin: Secondary | ICD-10-CM | POA: Diagnosis not present

## 2015-06-14 DIAGNOSIS — T368X5D Adverse effect of other systemic antibiotics, subsequent encounter: Secondary | ICD-10-CM | POA: Diagnosis not present

## 2015-06-14 DIAGNOSIS — E11621 Type 2 diabetes mellitus with foot ulcer: Secondary | ICD-10-CM | POA: Diagnosis not present

## 2015-06-14 DIAGNOSIS — E1122 Type 2 diabetes mellitus with diabetic chronic kidney disease: Secondary | ICD-10-CM | POA: Diagnosis not present

## 2015-06-14 DIAGNOSIS — N179 Acute kidney failure, unspecified: Secondary | ICD-10-CM | POA: Diagnosis not present

## 2015-06-14 DIAGNOSIS — L03032 Cellulitis of left toe: Secondary | ICD-10-CM | POA: Diagnosis not present

## 2015-06-21 DIAGNOSIS — L97521 Non-pressure chronic ulcer of other part of left foot limited to breakdown of skin: Secondary | ICD-10-CM | POA: Diagnosis not present

## 2015-06-21 DIAGNOSIS — E1122 Type 2 diabetes mellitus with diabetic chronic kidney disease: Secondary | ICD-10-CM | POA: Diagnosis not present

## 2015-06-21 DIAGNOSIS — T368X5D Adverse effect of other systemic antibiotics, subsequent encounter: Secondary | ICD-10-CM | POA: Diagnosis not present

## 2015-06-21 DIAGNOSIS — E11621 Type 2 diabetes mellitus with foot ulcer: Secondary | ICD-10-CM | POA: Diagnosis not present

## 2015-06-21 DIAGNOSIS — L03032 Cellulitis of left toe: Secondary | ICD-10-CM | POA: Diagnosis not present

## 2015-06-21 DIAGNOSIS — N179 Acute kidney failure, unspecified: Secondary | ICD-10-CM | POA: Diagnosis not present

## 2015-06-22 DIAGNOSIS — L97522 Non-pressure chronic ulcer of other part of left foot with fat layer exposed: Secondary | ICD-10-CM | POA: Diagnosis not present

## 2015-06-22 DIAGNOSIS — I739 Peripheral vascular disease, unspecified: Secondary | ICD-10-CM | POA: Diagnosis not present

## 2015-06-22 DIAGNOSIS — E1142 Type 2 diabetes mellitus with diabetic polyneuropathy: Secondary | ICD-10-CM | POA: Diagnosis not present

## 2015-06-22 DIAGNOSIS — Z89521 Acquired absence of right knee: Secondary | ICD-10-CM | POA: Diagnosis not present

## 2015-06-29 DIAGNOSIS — N179 Acute kidney failure, unspecified: Secondary | ICD-10-CM | POA: Diagnosis not present

## 2015-06-29 DIAGNOSIS — T368X5D Adverse effect of other systemic antibiotics, subsequent encounter: Secondary | ICD-10-CM | POA: Diagnosis not present

## 2015-06-29 DIAGNOSIS — E11621 Type 2 diabetes mellitus with foot ulcer: Secondary | ICD-10-CM | POA: Diagnosis not present

## 2015-06-29 DIAGNOSIS — E1122 Type 2 diabetes mellitus with diabetic chronic kidney disease: Secondary | ICD-10-CM | POA: Diagnosis not present

## 2015-06-29 DIAGNOSIS — L03032 Cellulitis of left toe: Secondary | ICD-10-CM | POA: Diagnosis not present

## 2015-06-29 DIAGNOSIS — L97521 Non-pressure chronic ulcer of other part of left foot limited to breakdown of skin: Secondary | ICD-10-CM | POA: Diagnosis not present

## 2015-07-04 DIAGNOSIS — L97521 Non-pressure chronic ulcer of other part of left foot limited to breakdown of skin: Secondary | ICD-10-CM | POA: Diagnosis not present

## 2015-07-04 DIAGNOSIS — E1122 Type 2 diabetes mellitus with diabetic chronic kidney disease: Secondary | ICD-10-CM | POA: Diagnosis not present

## 2015-07-04 DIAGNOSIS — L03032 Cellulitis of left toe: Secondary | ICD-10-CM | POA: Diagnosis not present

## 2015-07-04 DIAGNOSIS — N289 Disorder of kidney and ureter, unspecified: Secondary | ICD-10-CM | POA: Diagnosis not present

## 2015-07-04 DIAGNOSIS — E11621 Type 2 diabetes mellitus with foot ulcer: Secondary | ICD-10-CM | POA: Diagnosis not present

## 2015-07-04 DIAGNOSIS — K219 Gastro-esophageal reflux disease without esophagitis: Secondary | ICD-10-CM | POA: Diagnosis not present

## 2015-07-04 DIAGNOSIS — T368X5D Adverse effect of other systemic antibiotics, subsequent encounter: Secondary | ICD-10-CM | POA: Diagnosis not present

## 2015-07-04 DIAGNOSIS — Z7901 Long term (current) use of anticoagulants: Secondary | ICD-10-CM | POA: Diagnosis not present

## 2015-07-04 DIAGNOSIS — N179 Acute kidney failure, unspecified: Secondary | ICD-10-CM | POA: Diagnosis not present

## 2015-07-06 DIAGNOSIS — E113553 Type 2 diabetes mellitus with stable proliferative diabetic retinopathy, bilateral: Secondary | ICD-10-CM | POA: Diagnosis not present

## 2015-07-12 DIAGNOSIS — E1165 Type 2 diabetes mellitus with hyperglycemia: Secondary | ICD-10-CM | POA: Diagnosis not present

## 2015-07-12 DIAGNOSIS — M1712 Unilateral primary osteoarthritis, left knee: Secondary | ICD-10-CM | POA: Diagnosis not present

## 2015-07-12 DIAGNOSIS — E11621 Type 2 diabetes mellitus with foot ulcer: Secondary | ICD-10-CM | POA: Diagnosis not present

## 2015-07-12 DIAGNOSIS — M799 Soft tissue disorder, unspecified: Secondary | ICD-10-CM | POA: Diagnosis not present

## 2015-07-13 DIAGNOSIS — Z789 Other specified health status: Secondary | ICD-10-CM | POA: Diagnosis not present

## 2015-07-13 DIAGNOSIS — N144 Toxic nephropathy, not elsewhere classified: Secondary | ICD-10-CM | POA: Diagnosis not present

## 2015-07-13 DIAGNOSIS — E11621 Type 2 diabetes mellitus with foot ulcer: Secondary | ICD-10-CM | POA: Diagnosis not present

## 2015-07-13 DIAGNOSIS — M799 Soft tissue disorder, unspecified: Secondary | ICD-10-CM | POA: Diagnosis not present

## 2015-07-13 DIAGNOSIS — I2510 Atherosclerotic heart disease of native coronary artery without angina pectoris: Secondary | ICD-10-CM | POA: Diagnosis not present

## 2015-07-14 DIAGNOSIS — Z789 Other specified health status: Secondary | ICD-10-CM | POA: Diagnosis not present

## 2015-07-14 DIAGNOSIS — N144 Toxic nephropathy, not elsewhere classified: Secondary | ICD-10-CM | POA: Diagnosis not present

## 2015-07-14 DIAGNOSIS — I2510 Atherosclerotic heart disease of native coronary artery without angina pectoris: Secondary | ICD-10-CM | POA: Diagnosis not present

## 2015-07-14 DIAGNOSIS — I743 Embolism and thrombosis of arteries of the lower extremities: Secondary | ICD-10-CM | POA: Diagnosis not present

## 2015-07-14 DIAGNOSIS — L03116 Cellulitis of left lower limb: Secondary | ICD-10-CM | POA: Diagnosis not present

## 2015-07-14 DIAGNOSIS — I10 Essential (primary) hypertension: Secondary | ICD-10-CM | POA: Diagnosis not present

## 2015-07-14 DIAGNOSIS — E11621 Type 2 diabetes mellitus with foot ulcer: Secondary | ICD-10-CM | POA: Diagnosis not present

## 2015-07-15 DIAGNOSIS — N144 Toxic nephropathy, not elsewhere classified: Secondary | ICD-10-CM | POA: Diagnosis not present

## 2015-07-15 DIAGNOSIS — E11621 Type 2 diabetes mellitus with foot ulcer: Secondary | ICD-10-CM | POA: Diagnosis not present

## 2015-07-15 DIAGNOSIS — Z789 Other specified health status: Secondary | ICD-10-CM | POA: Diagnosis not present

## 2015-07-15 DIAGNOSIS — I2510 Atherosclerotic heart disease of native coronary artery without angina pectoris: Secondary | ICD-10-CM | POA: Diagnosis not present

## 2015-07-15 DIAGNOSIS — I10 Essential (primary) hypertension: Secondary | ICD-10-CM | POA: Diagnosis not present

## 2015-07-15 DIAGNOSIS — Z452 Encounter for adjustment and management of vascular access device: Secondary | ICD-10-CM | POA: Diagnosis not present

## 2015-07-15 DIAGNOSIS — L03116 Cellulitis of left lower limb: Secondary | ICD-10-CM | POA: Diagnosis not present

## 2015-07-16 DIAGNOSIS — E11621 Type 2 diabetes mellitus with foot ulcer: Secondary | ICD-10-CM | POA: Diagnosis not present

## 2015-07-16 DIAGNOSIS — R609 Edema, unspecified: Secondary | ICD-10-CM | POA: Diagnosis not present

## 2015-07-16 DIAGNOSIS — Z789 Other specified health status: Secondary | ICD-10-CM | POA: Diagnosis not present

## 2015-07-16 DIAGNOSIS — I2510 Atherosclerotic heart disease of native coronary artery without angina pectoris: Secondary | ICD-10-CM | POA: Diagnosis not present

## 2015-07-16 DIAGNOSIS — E1142 Type 2 diabetes mellitus with diabetic polyneuropathy: Secondary | ICD-10-CM | POA: Diagnosis not present

## 2015-07-16 DIAGNOSIS — N144 Toxic nephropathy, not elsewhere classified: Secondary | ICD-10-CM | POA: Diagnosis not present

## 2015-07-17 DIAGNOSIS — E1142 Type 2 diabetes mellitus with diabetic polyneuropathy: Secondary | ICD-10-CM | POA: Diagnosis not present

## 2015-07-17 DIAGNOSIS — I2510 Atherosclerotic heart disease of native coronary artery without angina pectoris: Secondary | ICD-10-CM | POA: Diagnosis not present

## 2015-07-17 DIAGNOSIS — E11621 Type 2 diabetes mellitus with foot ulcer: Secondary | ICD-10-CM | POA: Diagnosis not present

## 2015-07-17 DIAGNOSIS — N144 Toxic nephropathy, not elsewhere classified: Secondary | ICD-10-CM | POA: Diagnosis not present

## 2015-07-17 DIAGNOSIS — Z789 Other specified health status: Secondary | ICD-10-CM | POA: Diagnosis not present

## 2015-07-17 DIAGNOSIS — R609 Edema, unspecified: Secondary | ICD-10-CM | POA: Diagnosis not present

## 2015-07-18 DIAGNOSIS — L03116 Cellulitis of left lower limb: Secondary | ICD-10-CM | POA: Diagnosis not present

## 2015-07-18 DIAGNOSIS — Z789 Other specified health status: Secondary | ICD-10-CM | POA: Diagnosis not present

## 2015-07-18 DIAGNOSIS — N144 Toxic nephropathy, not elsewhere classified: Secondary | ICD-10-CM | POA: Diagnosis not present

## 2015-07-18 DIAGNOSIS — I10 Essential (primary) hypertension: Secondary | ICD-10-CM | POA: Diagnosis not present

## 2015-07-18 DIAGNOSIS — I2510 Atherosclerotic heart disease of native coronary artery without angina pectoris: Secondary | ICD-10-CM | POA: Diagnosis not present

## 2015-07-18 DIAGNOSIS — E11621 Type 2 diabetes mellitus with foot ulcer: Secondary | ICD-10-CM | POA: Diagnosis not present

## 2015-07-19 DIAGNOSIS — M869 Osteomyelitis, unspecified: Secondary | ICD-10-CM | POA: Diagnosis not present

## 2015-07-19 DIAGNOSIS — E11621 Type 2 diabetes mellitus with foot ulcer: Secondary | ICD-10-CM | POA: Diagnosis not present

## 2015-07-19 DIAGNOSIS — I10 Essential (primary) hypertension: Secondary | ICD-10-CM | POA: Diagnosis not present

## 2015-07-19 DIAGNOSIS — L03116 Cellulitis of left lower limb: Secondary | ICD-10-CM | POA: Diagnosis not present

## 2015-07-19 DIAGNOSIS — N189 Chronic kidney disease, unspecified: Secondary | ICD-10-CM | POA: Diagnosis not present

## 2015-07-19 DIAGNOSIS — E1151 Type 2 diabetes mellitus with diabetic peripheral angiopathy without gangrene: Secondary | ICD-10-CM | POA: Diagnosis not present

## 2015-07-19 DIAGNOSIS — Z789 Other specified health status: Secondary | ICD-10-CM | POA: Diagnosis not present

## 2015-07-20 DIAGNOSIS — I10 Essential (primary) hypertension: Secondary | ICD-10-CM | POA: Diagnosis not present

## 2015-07-20 DIAGNOSIS — E1151 Type 2 diabetes mellitus with diabetic peripheral angiopathy without gangrene: Secondary | ICD-10-CM | POA: Diagnosis not present

## 2015-07-20 DIAGNOSIS — N189 Chronic kidney disease, unspecified: Secondary | ICD-10-CM | POA: Diagnosis not present

## 2015-07-20 DIAGNOSIS — M869 Osteomyelitis, unspecified: Secondary | ICD-10-CM | POA: Diagnosis not present

## 2015-07-20 DIAGNOSIS — Z789 Other specified health status: Secondary | ICD-10-CM | POA: Diagnosis not present

## 2015-07-20 DIAGNOSIS — E11621 Type 2 diabetes mellitus with foot ulcer: Secondary | ICD-10-CM | POA: Diagnosis not present

## 2015-07-20 DIAGNOSIS — L03116 Cellulitis of left lower limb: Secondary | ICD-10-CM | POA: Diagnosis not present

## 2015-07-21 DIAGNOSIS — M869 Osteomyelitis, unspecified: Secondary | ICD-10-CM | POA: Diagnosis not present

## 2015-07-21 DIAGNOSIS — M7989 Other specified soft tissue disorders: Secondary | ICD-10-CM | POA: Diagnosis not present

## 2015-07-21 DIAGNOSIS — E1151 Type 2 diabetes mellitus with diabetic peripheral angiopathy without gangrene: Secondary | ICD-10-CM | POA: Diagnosis not present

## 2015-07-21 DIAGNOSIS — N189 Chronic kidney disease, unspecified: Secondary | ICD-10-CM | POA: Diagnosis not present

## 2015-07-21 DIAGNOSIS — E11621 Type 2 diabetes mellitus with foot ulcer: Secondary | ICD-10-CM | POA: Diagnosis not present

## 2015-07-22 DIAGNOSIS — M86172 Other acute osteomyelitis, left ankle and foot: Secondary | ICD-10-CM | POA: Diagnosis not present

## 2015-07-22 DIAGNOSIS — N179 Acute kidney failure, unspecified: Secondary | ICD-10-CM | POA: Diagnosis not present

## 2015-07-22 DIAGNOSIS — M869 Osteomyelitis, unspecified: Secondary | ICD-10-CM | POA: Diagnosis not present

## 2015-07-22 DIAGNOSIS — E1151 Type 2 diabetes mellitus with diabetic peripheral angiopathy without gangrene: Secondary | ICD-10-CM | POA: Diagnosis not present

## 2015-07-22 DIAGNOSIS — B999 Unspecified infectious disease: Secondary | ICD-10-CM | POA: Diagnosis not present

## 2015-07-22 DIAGNOSIS — E1165 Type 2 diabetes mellitus with hyperglycemia: Secondary | ICD-10-CM | POA: Diagnosis not present

## 2015-07-22 DIAGNOSIS — E11621 Type 2 diabetes mellitus with foot ulcer: Secondary | ICD-10-CM | POA: Diagnosis not present

## 2015-07-22 DIAGNOSIS — Q245 Malformation of coronary vessels: Secondary | ICD-10-CM | POA: Diagnosis not present

## 2015-07-22 DIAGNOSIS — L97509 Non-pressure chronic ulcer of other part of unspecified foot with unspecified severity: Secondary | ICD-10-CM | POA: Diagnosis not present

## 2015-07-23 DIAGNOSIS — I252 Old myocardial infarction: Secondary | ICD-10-CM | POA: Diagnosis not present

## 2015-07-23 DIAGNOSIS — E1140 Type 2 diabetes mellitus with diabetic neuropathy, unspecified: Secondary | ICD-10-CM | POA: Diagnosis not present

## 2015-07-23 DIAGNOSIS — M86172 Other acute osteomyelitis, left ankle and foot: Secondary | ICD-10-CM | POA: Diagnosis not present

## 2015-07-23 DIAGNOSIS — N179 Acute kidney failure, unspecified: Secondary | ICD-10-CM | POA: Diagnosis not present

## 2015-07-23 DIAGNOSIS — E11621 Type 2 diabetes mellitus with foot ulcer: Secondary | ICD-10-CM | POA: Diagnosis not present

## 2015-07-24 DIAGNOSIS — N179 Acute kidney failure, unspecified: Secondary | ICD-10-CM | POA: Diagnosis not present

## 2015-07-24 DIAGNOSIS — E1140 Type 2 diabetes mellitus with diabetic neuropathy, unspecified: Secondary | ICD-10-CM | POA: Diagnosis not present

## 2015-07-24 DIAGNOSIS — E11621 Type 2 diabetes mellitus with foot ulcer: Secondary | ICD-10-CM | POA: Diagnosis not present

## 2015-07-24 DIAGNOSIS — M86172 Other acute osteomyelitis, left ankle and foot: Secondary | ICD-10-CM | POA: Diagnosis not present

## 2015-07-25 DIAGNOSIS — E11621 Type 2 diabetes mellitus with foot ulcer: Secondary | ICD-10-CM | POA: Diagnosis not present

## 2015-07-25 DIAGNOSIS — M86172 Other acute osteomyelitis, left ankle and foot: Secondary | ICD-10-CM | POA: Diagnosis not present

## 2015-07-25 DIAGNOSIS — N179 Acute kidney failure, unspecified: Secondary | ICD-10-CM | POA: Diagnosis not present

## 2015-07-25 DIAGNOSIS — E1140 Type 2 diabetes mellitus with diabetic neuropathy, unspecified: Secondary | ICD-10-CM | POA: Diagnosis not present

## 2015-07-26 DIAGNOSIS — Z89519 Acquired absence of unspecified leg below knee: Secondary | ICD-10-CM | POA: Diagnosis not present

## 2015-07-26 DIAGNOSIS — E11621 Type 2 diabetes mellitus with foot ulcer: Secondary | ICD-10-CM | POA: Diagnosis not present

## 2015-07-26 DIAGNOSIS — M86172 Other acute osteomyelitis, left ankle and foot: Secondary | ICD-10-CM | POA: Diagnosis not present

## 2015-07-26 DIAGNOSIS — I739 Peripheral vascular disease, unspecified: Secondary | ICD-10-CM | POA: Diagnosis not present

## 2015-07-27 DIAGNOSIS — N179 Acute kidney failure, unspecified: Secondary | ICD-10-CM | POA: Diagnosis not present

## 2015-07-27 DIAGNOSIS — M86172 Other acute osteomyelitis, left ankle and foot: Secondary | ICD-10-CM | POA: Diagnosis not present

## 2015-07-27 DIAGNOSIS — E11621 Type 2 diabetes mellitus with foot ulcer: Secondary | ICD-10-CM | POA: Diagnosis not present

## 2015-07-27 DIAGNOSIS — E1140 Type 2 diabetes mellitus with diabetic neuropathy, unspecified: Secondary | ICD-10-CM | POA: Diagnosis not present

## 2015-07-28 DIAGNOSIS — E11621 Type 2 diabetes mellitus with foot ulcer: Secondary | ICD-10-CM | POA: Diagnosis not present

## 2015-07-28 DIAGNOSIS — Z89519 Acquired absence of unspecified leg below knee: Secondary | ICD-10-CM | POA: Diagnosis not present

## 2015-07-28 DIAGNOSIS — M89672 Osteopathy after poliomyelitis, left ankle and foot: Secondary | ICD-10-CM | POA: Diagnosis not present

## 2015-07-28 DIAGNOSIS — I739 Peripheral vascular disease, unspecified: Secondary | ICD-10-CM | POA: Diagnosis not present

## 2015-07-29 ENCOUNTER — Inpatient Hospital Stay (HOSPITAL_COMMUNITY)
Admission: EM | Admit: 2015-07-29 | Discharge: 2015-08-03 | DRG: 071 | Disposition: A | Discharge status: Other Acute Inpatient Hospital | Payer: Medicare Other | Attending: Internal Medicine | Admitting: Internal Medicine

## 2015-07-29 ENCOUNTER — Emergency Department (HOSPITAL_COMMUNITY): Payer: Medicare Other

## 2015-07-29 ENCOUNTER — Encounter (HOSPITAL_COMMUNITY): Payer: Self-pay

## 2015-07-29 DIAGNOSIS — S0993XA Unspecified injury of face, initial encounter: Secondary | ICD-10-CM | POA: Diagnosis not present

## 2015-07-29 DIAGNOSIS — T148 Other injury of unspecified body region: Secondary | ICD-10-CM | POA: Diagnosis not present

## 2015-07-29 DIAGNOSIS — E875 Hyperkalemia: Secondary | ICD-10-CM | POA: Diagnosis present

## 2015-07-29 DIAGNOSIS — E1169 Type 2 diabetes mellitus with other specified complication: Secondary | ICD-10-CM | POA: Diagnosis present

## 2015-07-29 DIAGNOSIS — I429 Cardiomyopathy, unspecified: Secondary | ICD-10-CM | POA: Diagnosis present

## 2015-07-29 DIAGNOSIS — N179 Acute kidney failure, unspecified: Secondary | ICD-10-CM | POA: Diagnosis not present

## 2015-07-29 DIAGNOSIS — L089 Local infection of the skin and subcutaneous tissue, unspecified: Secondary | ICD-10-CM

## 2015-07-29 DIAGNOSIS — R451 Restlessness and agitation: Secondary | ICD-10-CM | POA: Diagnosis present

## 2015-07-29 DIAGNOSIS — D649 Anemia, unspecified: Secondary | ICD-10-CM | POA: Diagnosis present

## 2015-07-29 DIAGNOSIS — M869 Osteomyelitis, unspecified: Secondary | ICD-10-CM | POA: Diagnosis present

## 2015-07-29 DIAGNOSIS — I509 Heart failure, unspecified: Secondary | ICD-10-CM

## 2015-07-29 DIAGNOSIS — R740 Nonspecific elevation of levels of transaminase and lactic acid dehydrogenase [LDH]: Secondary | ICD-10-CM | POA: Diagnosis present

## 2015-07-29 DIAGNOSIS — Z79899 Other long term (current) drug therapy: Secondary | ICD-10-CM

## 2015-07-29 DIAGNOSIS — N189 Chronic kidney disease, unspecified: Secondary | ICD-10-CM | POA: Diagnosis present

## 2015-07-29 DIAGNOSIS — G9340 Encephalopathy, unspecified: Secondary | ICD-10-CM | POA: Diagnosis present

## 2015-07-29 DIAGNOSIS — E1165 Type 2 diabetes mellitus with hyperglycemia: Secondary | ICD-10-CM

## 2015-07-29 DIAGNOSIS — I2510 Atherosclerotic heart disease of native coronary artery without angina pectoris: Secondary | ICD-10-CM | POA: Diagnosis present

## 2015-07-29 DIAGNOSIS — E669 Obesity, unspecified: Secondary | ICD-10-CM

## 2015-07-29 DIAGNOSIS — S81802A Unspecified open wound, left lower leg, initial encounter: Secondary | ICD-10-CM | POA: Diagnosis present

## 2015-07-29 DIAGNOSIS — S01511A Laceration without foreign body of lip, initial encounter: Secondary | ICD-10-CM | POA: Diagnosis present

## 2015-07-29 DIAGNOSIS — S0093XA Contusion of unspecified part of head, initial encounter: Secondary | ICD-10-CM | POA: Diagnosis not present

## 2015-07-29 DIAGNOSIS — E1122 Type 2 diabetes mellitus with diabetic chronic kidney disease: Secondary | ICD-10-CM | POA: Diagnosis present

## 2015-07-29 DIAGNOSIS — I5022 Chronic systolic (congestive) heart failure: Secondary | ICD-10-CM | POA: Diagnosis present

## 2015-07-29 DIAGNOSIS — Z880 Allergy status to penicillin: Secondary | ICD-10-CM

## 2015-07-29 DIAGNOSIS — R4182 Altered mental status, unspecified: Secondary | ICD-10-CM | POA: Diagnosis not present

## 2015-07-29 DIAGNOSIS — R410 Disorientation, unspecified: Secondary | ICD-10-CM

## 2015-07-29 DIAGNOSIS — D61818 Other pancytopenia: Secondary | ICD-10-CM | POA: Diagnosis present

## 2015-07-29 DIAGNOSIS — J90 Pleural effusion, not elsewhere classified: Secondary | ICD-10-CM | POA: Diagnosis not present

## 2015-07-29 DIAGNOSIS — W050XXA Fall from non-moving wheelchair, initial encounter: Secondary | ICD-10-CM | POA: Diagnosis present

## 2015-07-29 DIAGNOSIS — R443 Hallucinations, unspecified: Secondary | ICD-10-CM | POA: Diagnosis present

## 2015-07-29 DIAGNOSIS — Z87891 Personal history of nicotine dependence: Secondary | ICD-10-CM

## 2015-07-29 DIAGNOSIS — Z89511 Acquired absence of right leg below knee: Secondary | ICD-10-CM

## 2015-07-29 DIAGNOSIS — R748 Abnormal levels of other serum enzymes: Secondary | ICD-10-CM | POA: Clinically undetermined

## 2015-07-29 DIAGNOSIS — D696 Thrombocytopenia, unspecified: Secondary | ICD-10-CM | POA: Diagnosis present

## 2015-07-29 DIAGNOSIS — E861 Hypovolemia: Secondary | ICD-10-CM | POA: Diagnosis present

## 2015-07-29 DIAGNOSIS — M86172 Other acute osteomyelitis, left ankle and foot: Secondary | ICD-10-CM | POA: Diagnosis not present

## 2015-07-29 DIAGNOSIS — I739 Peripheral vascular disease, unspecified: Secondary | ICD-10-CM | POA: Diagnosis present

## 2015-07-29 DIAGNOSIS — E1140 Type 2 diabetes mellitus with diabetic neuropathy, unspecified: Secondary | ICD-10-CM | POA: Diagnosis not present

## 2015-07-29 DIAGNOSIS — T148XXA Other injury of unspecified body region, initial encounter: Secondary | ICD-10-CM

## 2015-07-29 DIAGNOSIS — IMO0002 Reserved for concepts with insufficient information to code with codable children: Secondary | ICD-10-CM

## 2015-07-29 DIAGNOSIS — E11621 Type 2 diabetes mellitus with foot ulcer: Secondary | ICD-10-CM | POA: Diagnosis not present

## 2015-07-29 DIAGNOSIS — W19XXXA Unspecified fall, initial encounter: Secondary | ICD-10-CM

## 2015-07-29 DIAGNOSIS — E119 Type 2 diabetes mellitus without complications: Secondary | ICD-10-CM | POA: Diagnosis not present

## 2015-07-29 DIAGNOSIS — E1151 Type 2 diabetes mellitus with diabetic peripheral angiopathy without gangrene: Secondary | ICD-10-CM | POA: Diagnosis present

## 2015-07-29 DIAGNOSIS — Z794 Long term (current) use of insulin: Secondary | ICD-10-CM

## 2015-07-29 DIAGNOSIS — Z951 Presence of aortocoronary bypass graft: Secondary | ICD-10-CM

## 2015-07-29 HISTORY — DX: Type 2 diabetes mellitus without complications: E11.9

## 2015-07-29 HISTORY — DX: Osteomyelitis, unspecified: M86.9

## 2015-07-29 LAB — URINALYSIS, ROUTINE W REFLEX MICROSCOPIC
BILIRUBIN URINE: NEGATIVE
Glucose, UA: NEGATIVE mg/dL
Hgb urine dipstick: NEGATIVE
KETONES UR: NEGATIVE mg/dL
LEUKOCYTES UA: NEGATIVE
NITRITE: NEGATIVE
Protein, ur: NEGATIVE mg/dL
Specific Gravity, Urine: 1.015 (ref 1.005–1.030)
pH: 5 (ref 5.0–8.0)

## 2015-07-29 LAB — I-STAT CG4 LACTIC ACID, ED: LACTIC ACID, VENOUS: 2.48 mmol/L — AB (ref 0.5–2.0)

## 2015-07-29 LAB — RAPID URINE DRUG SCREEN, HOSP PERFORMED
Amphetamines: NOT DETECTED
Barbiturates: NOT DETECTED
Benzodiazepines: NOT DETECTED
Cocaine: NOT DETECTED
OPIATES: POSITIVE — AB
TETRAHYDROCANNABINOL: NOT DETECTED

## 2015-07-29 LAB — CBC WITH DIFFERENTIAL/PLATELET
BASOS ABS: 0 10*3/uL (ref 0.0–0.1)
BASOS PCT: 1 %
EOS ABS: 0.2 10*3/uL (ref 0.0–0.7)
EOS PCT: 4 %
HCT: 28 % — ABNORMAL LOW (ref 39.0–52.0)
Hemoglobin: 9.4 g/dL — ABNORMAL LOW (ref 13.0–17.0)
LYMPHS PCT: 14 %
Lymphs Abs: 0.5 10*3/uL — ABNORMAL LOW (ref 0.7–4.0)
MCH: 28.7 pg (ref 26.0–34.0)
MCHC: 33.6 g/dL (ref 30.0–36.0)
MCV: 85.6 fL (ref 78.0–100.0)
MONO ABS: 0.3 10*3/uL (ref 0.1–1.0)
Monocytes Relative: 9 %
Neutro Abs: 2.7 10*3/uL (ref 1.7–7.7)
Neutrophils Relative %: 72 %
PLATELETS: 54 10*3/uL — AB (ref 150–400)
RBC: 3.27 MIL/uL — AB (ref 4.22–5.81)
RDW: 13.7 % (ref 11.5–15.5)
WBC: 3.8 10*3/uL — AB (ref 4.0–10.5)

## 2015-07-29 LAB — I-STAT CHEM 8, ED
BUN: 47 mg/dL — ABNORMAL HIGH (ref 6–20)
CALCIUM ION: 1.11 mmol/L — AB (ref 1.12–1.23)
Chloride: 96 mmol/L — ABNORMAL LOW (ref 101–111)
Creatinine, Ser: 2.5 mg/dL — ABNORMAL HIGH (ref 0.61–1.24)
Glucose, Bld: 166 mg/dL — ABNORMAL HIGH (ref 65–99)
HEMATOCRIT: 27 % — AB (ref 39.0–52.0)
HEMOGLOBIN: 9.2 g/dL — AB (ref 13.0–17.0)
Potassium: 5.9 mmol/L — ABNORMAL HIGH (ref 3.5–5.1)
SODIUM: 136 mmol/L (ref 135–145)
TCO2: 31 mmol/L (ref 0–100)

## 2015-07-29 LAB — COMPREHENSIVE METABOLIC PANEL
ALT: 13 U/L — AB (ref 17–63)
AST: 22 U/L (ref 15–41)
Albumin: 3.8 g/dL (ref 3.5–5.0)
Alkaline Phosphatase: 41 U/L (ref 38–126)
Anion gap: 11 (ref 5–15)
BUN: 48 mg/dL — AB (ref 6–20)
CHLORIDE: 97 mmol/L — AB (ref 101–111)
CO2: 30 mmol/L (ref 22–32)
Calcium: 9.4 mg/dL (ref 8.9–10.3)
Creatinine, Ser: 2.48 mg/dL — ABNORMAL HIGH (ref 0.61–1.24)
GFR calc Af Amer: 33 mL/min — ABNORMAL LOW (ref >60)
GFR, EST NON AFRICAN AMERICAN: 28 mL/min — AB (ref >60)
Glucose, Bld: 178 mg/dL — ABNORMAL HIGH (ref 65–99)
POTASSIUM: 6.3 mmol/L — AB (ref 3.5–5.1)
SODIUM: 138 mmol/L (ref 135–145)
Total Bilirubin: 0.5 mg/dL (ref 0.3–1.2)
Total Protein: 6.7 g/dL (ref 6.5–8.1)

## 2015-07-29 LAB — I-STAT TROPONIN, ED: Troponin i, poc: 0.02 ng/mL (ref 0.00–0.08)

## 2015-07-29 LAB — PROTIME-INR
INR: 1.37 (ref 0.00–1.49)
PROTHROMBIN TIME: 17 s — AB (ref 11.6–15.2)

## 2015-07-29 LAB — CBG MONITORING, ED
GLUCOSE-CAPILLARY: 162 mg/dL — AB (ref 65–99)
Glucose-Capillary: 124 mg/dL — ABNORMAL HIGH (ref 65–99)

## 2015-07-29 LAB — APTT: aPTT: 39 seconds — ABNORMAL HIGH (ref 24–37)

## 2015-07-29 MED ORDER — LIDOCAINE HCL (PF) 1 % IJ SOLN
5.0000 mL | Freq: Once | INTRAMUSCULAR | Status: AC
  Administered 2015-07-29: 5 mL via INTRADERMAL
  Filled 2015-07-29: qty 5

## 2015-07-29 MED ORDER — SODIUM CHLORIDE 0.9 % IV BOLUS (SEPSIS)
500.0000 mL | Freq: Once | INTRAVENOUS | Status: AC
  Administered 2015-07-29: 500 mL via INTRAVENOUS

## 2015-07-29 MED ORDER — SODIUM CHLORIDE 0.9 % IV BOLUS (SEPSIS)
1000.0000 mL | Freq: Once | INTRAVENOUS | Status: AC
  Administered 2015-07-29: 1000 mL via INTRAVENOUS

## 2015-07-29 MED ORDER — SODIUM CHLORIDE 0.9 % IV BOLUS (SEPSIS)
1000.0000 mL | Freq: Once | INTRAVENOUS | Status: AC
  Administered 2015-07-30: 1000 mL via INTRAVENOUS

## 2015-07-29 MED ORDER — SODIUM CHLORIDE 0.9 % IV SOLN
1.0000 g | Freq: Once | INTRAVENOUS | Status: AC
  Administered 2015-07-29: 1 g via INTRAVENOUS
  Filled 2015-07-29: qty 10

## 2015-07-29 MED ORDER — LORAZEPAM 2 MG/ML IJ SOLN
2.0000 mg | Freq: Once | INTRAMUSCULAR | Status: AC
  Administered 2015-07-30: 2 mg via INTRAVENOUS
  Filled 2015-07-29: qty 1

## 2015-07-29 NOTE — ED Notes (Addendum)
Spoke with wife Hilda Blades to get baseline of patient. Pt's wife states that his tremors have been getting worse. Pt is usually alert and oriented x 4 and can raise his arms and hold them up normally. She states that the pt is supposed to wear c-pap every night and has not been doing so. Pt's wife also states that before the pt went to [facility] the pt was having passing out spells.

## 2015-07-29 NOTE — ED Notes (Signed)
Dr Ralene Bathe notified of neuro exam

## 2015-07-29 NOTE — ED Notes (Signed)
Spoke with Dr Ralene Bathe and she verified the picc line placement on the chest x-ray.

## 2015-07-29 NOTE — ED Notes (Signed)
Per EMS - pt at [facility] for wound care and PT. Pt fell out of wheelchair today (denies dizziness/lightheadedness prior to fall), lac to lip. Denies LOC. CBG 200. Extensive hx regarding diabetes.

## 2015-07-29 NOTE — ED Notes (Signed)
Consulted IV team and was told that we were okay to use picc line since placement was verified on x-ray.

## 2015-07-29 NOTE — ED Provider Notes (Signed)
CSN: [id number]     Arrival date & time 07/29/15  1853 History   First MD Initiated Contact with Patient 07/29/15 1854     Chief Complaint  Patient presents with  . 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 Apparently, pt is at SNF because of his chronic foot wounds requiring PICC line Abx. Today, fell from wheelchair and struck head on ground. He hadn't been complaining about fever, chills, HA, weakness. This is all the history known as pt cannot contribute as he is altered.   (Consider location/radiation/quality/duration/timing/severity/associated sxs/prior Treatment) Patient is a 49 y.o. male presenting with skin laceration.  Laceration Location:  Face Facial laceration location: lip. Depth:  Cutaneous Bleeding: controlled   Laceration mechanism:  Fall   Past Medical History  Diagnosis Date  . Diabetes mellitus without complication ([place])    No past surgical history on file. No family history on file. Social History  Substance Use Topics  . Smoking status: Former Research scientist (life sciences)  . Smokeless tobacco: Not on file  . Alcohol Use: No    Review of Systems  Unable to perform ROS: Mental status change  Constitutional: Negative for fever and chills.  HENT: Positive for mouth sores (lip cut). Negative for congestion, dental problem and facial swelling.   Respiratory: Negative for shortness of breath.   Cardiovascular: Negative for chest pain, palpitations and leg swelling.  Gastrointestinal: Negative for nausea, vomiting, abdominal pain, diarrhea, constipation and abdominal distention.  Genitourinary: Negative for dysuria, frequency, flank pain and decreased urine volume.  Neurological: Negative for dizziness, speech difficulty, light-headedness and headaches.  All other systems reviewed and are negative.     Allergies  Penicillins  Home Medications    Prior to Admission medications   Medication Sig Start Date End Date Taking? Authorizing Provider  atorvastatin (LIPITOR) 40 MG tablet Take 40 mg by mouth daily.   Yes Historical Provider, MD  cyanocobalamin 1000 MCG tablet Take 100 mcg by mouth daily.   Yes Historical Provider, MD  DULoxetine (CYMBALTA) 60 MG capsule Take 60 mg by mouth every 12 (twelve) hours.   Yes Historical Provider, MD  fenofibrate (TRICOR) 48 MG tablet Take 48 mg by mouth at bedtime.   Yes Historical Provider, MD  ferrous sulfate 325 (65 FE) MG tablet Take 325 mg by mouth daily with breakfast.   Yes Historical Provider, MD  FLUoxetine (PROZAC) 20 MG capsule Take 20 mg by mouth at bedtime.   Yes Historical Provider, MD  furosemide (LASIX) 40 MG tablet Take 40 mg by mouth daily.   Yes Historical Provider, MD  gabapentin (NEURONTIN) 300 MG capsule Take 300 mg by mouth every 8 (eight) hours.   Yes Historical Provider, MD  ibuprofen (ADVIL,MOTRIN) 400 MG tablet Take 400 mg by mouth at bedtime.   Yes Historical Provider, MD  insulin detemir (LEVEMIR) 100 UNIT/ML injection Inject 15 Units into the skin every 12 (twelve) hours.   Yes Historical Provider, MD  isosorbide mononitrate (IMDUR) 60 MG 24 hr tablet Take 60 mg by mouth daily.   Yes Historical Provider, MD  metoprolol succinate (TOPROL-XL) 100 MG 24 hr tablet Take 100 mg by mouth daily. Take with or immediately following a meal.   Yes Historical Provider, MD  omega-3 acid ethyl esters (LOVAZA) 1 G capsule Take 1 g by mouth daily.   Yes Historical Provider, MD  OXcarbazepine (TRILEPTAL)  150 MG tablet Take 150 mg by mouth every 12 (twelve) hours.   Yes Historical Provider, MD  pantoprazole (PROTONIX) 40 MG tablet Take 40 mg by mouth daily.   Yes Historical Provider, MD  pregabalin (LYRICA) 200 MG capsule Take 200 mg by mouth every 12 (twelve) hours.   Yes Historical Provider, MD  ranolazine (RANEXA) 500 MG 12 hr tablet Take 500 mg by mouth 2 (two) times daily.   Yes  Historical Provider, MD  tamsulosin (FLOMAX) 0.4 MG CAPS capsule Take 0.4 mg by mouth daily.   Yes Historical Provider, MD   BP 143/82 mmHg  Pulse 86  Temp(Src) 98.1 F (36.7 C) (Oral)  Resp 13  SpO2 100% Physical Exam  Constitutional: He appears well-developed and well-nourished. No distress.  HENT:  Head: Normocephalic and atraumatic.  Lip laceration, bleeding controlled.   Eyes: Pupils are equal, round, and reactive to light.  Neck: Normal range of motion.  Cardiovascular: Normal rate, regular rhythm, normal heart sounds and intact distal pulses.  Exam reveals no gallop and no friction rub.   No murmur heard. Pulmonary/Chest: Effort normal and breath sounds normal. No respiratory distress. He has no wheezes. He has no rales. He exhibits no tenderness.  Abdominal: Soft. Bowel sounds are normal. He exhibits no distension and no mass. There is no tenderness. There is no rebound and no guarding.  Musculoskeletal: Normal range of motion. He exhibits no edema or tenderness.  Right BKA. Left toe chronic wounds, healing well.   Lymphadenopathy:    He has no cervical adenopathy.  Neurological: He is alert. He displays tremor. No cranial nerve deficit or sensory deficit. Coordination and gait abnormal. GCS eye subscore is 4. GCS verbal subscore is 4. GCS motor subscore is 5.  Skin: Skin is warm and dry. He is not diaphoretic.  Nursing note and vitals reviewed.   ED Course  .Marland KitchenLaceration Repair Date/Time: 07/30/2015 12:35 AM Performed by: Sherian Maroon Authorized by: Sherian Maroon Consent: Verbal consent obtained. Body area: mouth Location details: upper lip, interior Laceration length: 2 cm Tendon involvement: none Nerve involvement: none Vascular damage: no Irrigation solution: saline Irrigation method: syringe Debridement: none Degree of undermining: none Wound skin closure material used: 4-0 vicryl. Number of sutures: 4 Technique: simple Approximation: close Approximation  difficulty: simple Patient tolerance: Patient tolerated the procedure well with no immediate complications   (including critical care time) Labs Review Labs Reviewed  CBC WITH DIFFERENTIAL/PLATELET - Abnormal; Notable for the following:    WBC 3.8 (*)    RBC 3.27 (*)    Hemoglobin 9.4 (*)    HCT 28.0 (*)    Platelets 54 (*)    Lymphs Abs 0.5 (*)    All other components within normal limits  COMPREHENSIVE METABOLIC PANEL - Abnormal; Notable for the following:    Potassium 6.3 (*)    Chloride 97 (*)    Glucose, Bld 178 (*)    BUN 48 (*)    Creatinine, Ser 2.48 (*)    ALT 13 (*)    GFR calc non Af Amer 28 (*)    GFR calc Af Amer 33 (*)    All other components within normal limits  PROTIME-INR - Abnormal; Notable for the following:    Prothrombin Time 17.0 (*)    All other components within normal limits  APTT - Abnormal; Notable for the following:    aPTT 39 (*)    All other components within normal limits  URINE RAPID DRUG SCREEN, HOSP PERFORMED -  Abnormal; Notable for the following:    Opiates POSITIVE (*)    All other components within normal limits  CBG MONITORING, ED - Abnormal; Notable for the following:    Glucose-Capillary 162 (*)    All other components within normal limits  I-STAT CHEM 8, ED - Abnormal; Notable for the following:    Potassium 5.9 (*)    Chloride 96 (*)    BUN 47 (*)    Creatinine, Ser 2.50 (*)    Glucose, Bld 166 (*)    Calcium, Ion 1.11 (*)    Hemoglobin 9.2 (*)    HCT 27.0 (*)    All other components within normal limits  I-STAT CG4 LACTIC ACID, ED - Abnormal; Notable for the following:    Lactic Acid, Venous 2.48 (*)    All other components within normal limits  I-STAT CG4 LACTIC ACID, ED - Abnormal; Notable for the following:    Lactic Acid, Venous 2.79 (*)    All other components within normal limits  CBG MONITORING, ED - Abnormal; Notable for the following:    Glucose-Capillary 124 (*)    All other components within normal limits   CULTURE, BLOOD (ROUTINE X 2)  CULTURE, BLOOD (ROUTINE X 2)  URINALYSIS, ROUTINE W REFLEX MICROSCOPIC (NOT AT [facility])  BASIC METABOLIC PANEL  BLOOD GAS, ARTERIAL  I-STAT TROPOININ, ED    Imaging Review Ct Head Wo Contrast  07/29/2015  CLINICAL DATA:  Altered mental status post fall onto face EXAM: CT HEAD WITHOUT CONTRAST TECHNIQUE: Contiguous axial images were obtained from the base of the skull through the vertex without intravenous contrast. COMPARISON:  None. FINDINGS: Mucoperiosteal thickening in frontal sinuses. Extensive opacification of ethmoid air cells. There is no evidence of acute intracranial hemorrhage, brain edema, mass lesion, acute infarction, mass effect, or midline shift. Acute infarct may be inapparent on noncontrast CT. No other intra-axial abnormalities are seen, and the ventricles and sulci are within normal limits in size and symmetry. No abnormal extra-axial fluid collections or masses are identified. No significant calvarial abnormality. IMPRESSION: 1. Negative for bleed or other acute intracranial process. Electronically Signed   By: Lucrezia Europe M.D.   On: 07/29/2015 20:47   Dg Chest Port 1 View  07/29/2015  CLINICAL DATA:  Fall from a wheelchair today. EXAM: PORTABLE CHEST 1 VIEW COMPARISON:  None. FINDINGS: The mediastinal contour is normal. The heart size is enlarged. A right-sided venous line is identified with distal tip in the superior vena cava. There is a minimal left pleural effusion. There is no pulmonary edema or focal pneumonia. No acute fracture or dislocation is identified in the visualized bones. IMPRESSION: Minimal left pleural effusion.  No focal pneumonia. Electronically Signed   By: Abelardo Diesel M.D.   On: 07/29/2015 19:35   I have personally reviewed and evaluated these images and lab results as part of my medical decision-making.   EKG Interpretation   Date/Time:  Friday July 29 2015 20:41:22 EST Ventricular Rate:  78 PR Interval:  260 QRS  Duration: 125 QT Interval:  403 QTC Calculation: 459 R Axis:   88 Text Interpretation:  Sinus rhythm Prolonged PR interval Nonspecific  intraventricular conduction delay Anteroseptal infarct, old Confirmed by  Hazle Coca [phone]) on 07/29/2015 10:45:25 PM      MDM   Final diagnoses:  Lip laceration, initial encounter  Disorientation   49 yo M w/AMS and lip lac. Not DKA. Pt is on blood thinner, but CT head shows no acute bleed or  abnormality. No UTI. CXR clear of PNA.   Hyperkalemia, no peaked Tw[place]. Given calcium gluconate, fluids.  Approached pt to repair lac when he was noticeably more agitated and hallucinating talking about Trump and football games.   Lactic acid elevated, given IVFs. Still no obvious sign of infxn. Repeat lactic acid still elevated. No obvious source. Given broad spec Abx.   Pt ripped his right arm PICC line out in his agitation. Bleeding controlled and bandaged.  Pt then fell while getting off the ED bed. Small bump to right frontal area. No LOC. Will re-CT scan given he is on blood thinners.   Pt given ativan which was successful in calming him down. Not c/w Etoh withdrawal given no recent alcohol. Unclear if benzo withdrawal? Although, pt has no vital sign irregularities. Not c/w seizures. Lac repaired as above. Tdap updated.  Admitted to hospitalist for further evaluation.      Pt was seen under the supervision of Dr. Ralene Bathe.     Sherian Maroon, MD 07/30/15 [id number]  Quintella Reichert, MD 07/31/15 [phone]

## 2015-07-29 NOTE — ED Notes (Signed)
Resident went in room to suture lip laceration and pt could not sit still for procedure.

## 2015-07-29 NOTE — ED Notes (Addendum)
Lab just called with critical potassium result of 6.3. Resident notified. Resident ordered to repeat EKG.

## 2015-07-30 ENCOUNTER — Inpatient Hospital Stay (HOSPITAL_COMMUNITY): Payer: Medicare Other

## 2015-07-30 ENCOUNTER — Encounter (HOSPITAL_COMMUNITY): Payer: Self-pay | Admitting: Internal Medicine

## 2015-07-30 ENCOUNTER — Emergency Department (HOSPITAL_COMMUNITY): Payer: Medicare Other

## 2015-07-30 DIAGNOSIS — E1140 Type 2 diabetes mellitus with diabetic neuropathy, unspecified: Secondary | ICD-10-CM | POA: Diagnosis not present

## 2015-07-30 DIAGNOSIS — E1169 Type 2 diabetes mellitus with other specified complication: Secondary | ICD-10-CM | POA: Diagnosis present

## 2015-07-30 DIAGNOSIS — L089 Local infection of the skin and subcutaneous tissue, unspecified: Secondary | ICD-10-CM | POA: Diagnosis not present

## 2015-07-30 DIAGNOSIS — E119 Type 2 diabetes mellitus without complications: Secondary | ICD-10-CM | POA: Diagnosis not present

## 2015-07-30 DIAGNOSIS — D696 Thrombocytopenia, unspecified: Secondary | ICD-10-CM | POA: Diagnosis not present

## 2015-07-30 DIAGNOSIS — I429 Cardiomyopathy, unspecified: Secondary | ICD-10-CM | POA: Diagnosis present

## 2015-07-30 DIAGNOSIS — Z794 Long term (current) use of insulin: Secondary | ICD-10-CM | POA: Diagnosis not present

## 2015-07-30 DIAGNOSIS — D649 Anemia, unspecified: Secondary | ICD-10-CM | POA: Diagnosis not present

## 2015-07-30 DIAGNOSIS — R410 Disorientation, unspecified: Secondary | ICD-10-CM | POA: Diagnosis not present

## 2015-07-30 DIAGNOSIS — S81802S Unspecified open wound, left lower leg, sequela: Secondary | ICD-10-CM | POA: Diagnosis not present

## 2015-07-30 DIAGNOSIS — B999 Unspecified infectious disease: Secondary | ICD-10-CM | POA: Diagnosis not present

## 2015-07-30 DIAGNOSIS — E875 Hyperkalemia: Secondary | ICD-10-CM | POA: Diagnosis present

## 2015-07-30 DIAGNOSIS — R451 Restlessness and agitation: Secondary | ICD-10-CM | POA: Diagnosis present

## 2015-07-30 DIAGNOSIS — I2510 Atherosclerotic heart disease of native coronary artery without angina pectoris: Secondary | ICD-10-CM | POA: Insufficient documentation

## 2015-07-30 DIAGNOSIS — E11628 Type 2 diabetes mellitus with other skin complications: Secondary | ICD-10-CM | POA: Diagnosis not present

## 2015-07-30 DIAGNOSIS — S81802A Unspecified open wound, left lower leg, initial encounter: Secondary | ICD-10-CM | POA: Diagnosis present

## 2015-07-30 DIAGNOSIS — S01511A Laceration without foreign body of lip, initial encounter: Secondary | ICD-10-CM | POA: Diagnosis present

## 2015-07-30 DIAGNOSIS — B9689 Other specified bacterial agents as the cause of diseases classified elsewhere: Secondary | ICD-10-CM | POA: Diagnosis not present

## 2015-07-30 DIAGNOSIS — M86172 Other acute osteomyelitis, left ankle and foot: Secondary | ICD-10-CM | POA: Diagnosis not present

## 2015-07-30 DIAGNOSIS — Z79899 Other long term (current) drug therapy: Secondary | ICD-10-CM | POA: Diagnosis not present

## 2015-07-30 DIAGNOSIS — E1151 Type 2 diabetes mellitus with diabetic peripheral angiopathy without gangrene: Secondary | ICD-10-CM | POA: Diagnosis present

## 2015-07-30 DIAGNOSIS — IMO0002 Reserved for concepts with insufficient information to code with codable children: Secondary | ICD-10-CM

## 2015-07-30 DIAGNOSIS — N179 Acute kidney failure, unspecified: Secondary | ICD-10-CM | POA: Diagnosis not present

## 2015-07-30 DIAGNOSIS — R740 Nonspecific elevation of levels of transaminase and lactic acid dehydrogenase [LDH]: Secondary | ICD-10-CM | POA: Diagnosis present

## 2015-07-30 DIAGNOSIS — M869 Osteomyelitis, unspecified: Secondary | ICD-10-CM | POA: Diagnosis not present

## 2015-07-30 DIAGNOSIS — E861 Hypovolemia: Secondary | ICD-10-CM | POA: Diagnosis present

## 2015-07-30 DIAGNOSIS — N189 Chronic kidney disease, unspecified: Secondary | ICD-10-CM | POA: Diagnosis present

## 2015-07-30 DIAGNOSIS — I739 Peripheral vascular disease, unspecified: Secondary | ICD-10-CM | POA: Diagnosis present

## 2015-07-30 DIAGNOSIS — I5022 Chronic systolic (congestive) heart failure: Secondary | ICD-10-CM | POA: Insufficient documentation

## 2015-07-30 DIAGNOSIS — G9340 Encephalopathy, unspecified: Principal | ICD-10-CM

## 2015-07-30 DIAGNOSIS — W19XXXA Unspecified fall, initial encounter: Secondary | ICD-10-CM | POA: Insufficient documentation

## 2015-07-30 DIAGNOSIS — R4182 Altered mental status, unspecified: Secondary | ICD-10-CM | POA: Diagnosis not present

## 2015-07-30 DIAGNOSIS — W050XXA Fall from non-moving wheelchair, initial encounter: Secondary | ICD-10-CM | POA: Diagnosis present

## 2015-07-30 DIAGNOSIS — R609 Edema, unspecified: Secondary | ICD-10-CM | POA: Diagnosis not present

## 2015-07-30 DIAGNOSIS — D61818 Other pancytopenia: Secondary | ICD-10-CM | POA: Diagnosis present

## 2015-07-30 DIAGNOSIS — I509 Heart failure, unspecified: Secondary | ICD-10-CM

## 2015-07-30 DIAGNOSIS — S91302A Unspecified open wound, left foot, initial encounter: Secondary | ICD-10-CM | POA: Diagnosis not present

## 2015-07-30 DIAGNOSIS — R443 Hallucinations, unspecified: Secondary | ICD-10-CM | POA: Diagnosis present

## 2015-07-30 DIAGNOSIS — E1122 Type 2 diabetes mellitus with diabetic chronic kidney disease: Secondary | ICD-10-CM | POA: Diagnosis present

## 2015-07-30 DIAGNOSIS — E11621 Type 2 diabetes mellitus with foot ulcer: Secondary | ICD-10-CM | POA: Diagnosis not present

## 2015-07-30 DIAGNOSIS — Z87891 Personal history of nicotine dependence: Secondary | ICD-10-CM | POA: Diagnosis not present

## 2015-07-30 DIAGNOSIS — Z89511 Acquired absence of right leg below knee: Secondary | ICD-10-CM | POA: Diagnosis not present

## 2015-07-30 DIAGNOSIS — Z951 Presence of aortocoronary bypass graft: Secondary | ICD-10-CM | POA: Diagnosis not present

## 2015-07-30 DIAGNOSIS — Z880 Allergy status to penicillin: Secondary | ICD-10-CM | POA: Diagnosis not present

## 2015-07-30 DIAGNOSIS — E1165 Type 2 diabetes mellitus with hyperglycemia: Secondary | ICD-10-CM

## 2015-07-30 LAB — CBC
HCT: 24.4 % — ABNORMAL LOW (ref 39.0–52.0)
HEMOGLOBIN: 8.2 g/dL — AB (ref 13.0–17.0)
MCH: 29.1 pg (ref 26.0–34.0)
MCHC: 33.6 g/dL (ref 30.0–36.0)
MCV: 86.5 fL (ref 78.0–100.0)
Platelets: 49 10*3/uL — ABNORMAL LOW (ref 150–400)
RBC: 2.82 MIL/uL — ABNORMAL LOW (ref 4.22–5.81)
RDW: 13.7 % (ref 11.5–15.5)
WBC: 3.9 10*3/uL — ABNORMAL LOW (ref 4.0–10.5)

## 2015-07-30 LAB — CBC WITH DIFFERENTIAL/PLATELET
BASOS PCT: 1 %
Basophils Absolute: 0 10*3/uL (ref 0.0–0.1)
EOS ABS: 0.1 10*3/uL (ref 0.0–0.7)
EOS PCT: 4 %
HEMATOCRIT: 23.4 % — AB (ref 39.0–52.0)
HEMOGLOBIN: 7.8 g/dL — AB (ref 13.0–17.0)
LYMPHS ABS: 0.6 10*3/uL — AB (ref 0.7–4.0)
Lymphocytes Relative: 17 %
MCH: 28.7 pg (ref 26.0–34.0)
MCHC: 33.3 g/dL (ref 30.0–36.0)
MCV: 86 fL (ref 78.0–100.0)
MONOS PCT: 7 %
Monocytes Absolute: 0.2 10*3/uL (ref 0.1–1.0)
Neutro Abs: 2.6 10*3/uL (ref 1.7–7.7)
Neutrophils Relative %: 72 %
Platelets: 48 10*3/uL — ABNORMAL LOW (ref 150–400)
RBC: 2.72 MIL/uL — AB (ref 4.22–5.81)
RDW: 13.8 % (ref 11.5–15.5)
WBC: 3.6 10*3/uL — AB (ref 4.0–10.5)

## 2015-07-30 LAB — BASIC METABOLIC PANEL
Anion gap: 6 (ref 5–15)
Anion gap: 9 (ref 5–15)
BUN: 40 mg/dL — AB (ref 6–20)
BUN: 45 mg/dL — AB (ref 6–20)
CALCIUM: 9 mg/dL (ref 8.9–10.3)
CHLORIDE: 101 mmol/L (ref 101–111)
CHLORIDE: 99 mmol/L — AB (ref 101–111)
CO2: 31 mmol/L (ref 22–32)
CO2: 33 mmol/L — ABNORMAL HIGH (ref 22–32)
CREATININE: 2.05 mg/dL — AB (ref 0.61–1.24)
Calcium: 9.1 mg/dL (ref 8.9–10.3)
Creatinine, Ser: 2.29 mg/dL — ABNORMAL HIGH (ref 0.61–1.24)
GFR calc Af Amer: 36 mL/min — ABNORMAL LOW (ref >60)
GFR calc Af Amer: 42 mL/min — ABNORMAL LOW (ref >60)
GFR calc non Af Amer: 31 mL/min — ABNORMAL LOW (ref >60)
GFR, EST NON AFRICAN AMERICAN: 36 mL/min — AB (ref >60)
GLUCOSE: 144 mg/dL — AB (ref 65–99)
Glucose, Bld: 169 mg/dL — ABNORMAL HIGH (ref 65–99)
POTASSIUM: 5.3 mmol/L — AB (ref 3.5–5.1)
Potassium: 5.4 mmol/L — ABNORMAL HIGH (ref 3.5–5.1)
SODIUM: 140 mmol/L (ref 135–145)
Sodium: 139 mmol/L (ref 135–145)

## 2015-07-30 LAB — LACTIC ACID, PLASMA
LACTIC ACID, VENOUS: 1.1 mmol/L (ref 0.5–2.0)
Lactic Acid, Venous: 1 mmol/L (ref 0.5–2.0)

## 2015-07-30 LAB — GLUCOSE, CAPILLARY
GLUCOSE-CAPILLARY: 177 mg/dL — AB (ref 65–99)
GLUCOSE-CAPILLARY: 137 mg/dL — AB (ref 65–99)
Glucose-Capillary: 145 mg/dL — ABNORMAL HIGH (ref 65–99)
Glucose-Capillary: 96 mg/dL (ref 65–99)

## 2015-07-30 LAB — BRAIN NATRIURETIC PEPTIDE: B NATRIURETIC PEPTIDE 5: 550.4 pg/mL — AB (ref 0.0–100.0)

## 2015-07-30 LAB — I-STAT CG4 LACTIC ACID, ED: Lactic Acid, Venous: 2.79 mmol/L (ref 0.5–2.0)

## 2015-07-30 LAB — PROCALCITONIN: Procalcitonin: 0.11 ng/mL

## 2015-07-30 LAB — TSH: TSH: 3.669 u[IU]/mL (ref 0.350–4.500)

## 2015-07-30 LAB — MRSA PCR SCREENING: MRSA BY PCR: NEGATIVE

## 2015-07-30 MED ORDER — SODIUM CHLORIDE 0.9 % IV SOLN
INTRAVENOUS | Status: DC
  Administered 2015-07-30 – 2015-08-01 (×4): via INTRAVENOUS

## 2015-07-30 MED ORDER — SODIUM CHLORIDE 0.9 % IJ SOLN
3.0000 mL | Freq: Two times a day (BID) | INTRAMUSCULAR | Status: DC
  Administered 2015-07-30 – 2015-08-03 (×4): 3 mL via INTRAVENOUS

## 2015-07-30 MED ORDER — TETANUS-DIPHTH-ACELL PERTUSSIS 5-2.5-18.5 LF-MCG/0.5 IM SUSP
0.5000 mL | Freq: Once | INTRAMUSCULAR | Status: AC
  Administered 2015-07-30: 0.5 mL via INTRAMUSCULAR
  Filled 2015-07-30: qty 0.5

## 2015-07-30 MED ORDER — RANOLAZINE ER 500 MG PO TB12
500.0000 mg | ORAL_TABLET | Freq: Two times a day (BID) | ORAL | Status: DC
  Administered 2015-07-30 – 2015-08-03 (×8): 500 mg via ORAL
  Filled 2015-07-30 (×10): qty 1

## 2015-07-30 MED ORDER — ISOSORBIDE MONONITRATE ER 60 MG PO TB24
60.0000 mg | ORAL_TABLET | Freq: Every day | ORAL | Status: DC
  Administered 2015-07-31 – 2015-08-03 (×4): 60 mg via ORAL
  Filled 2015-07-30 (×4): qty 1

## 2015-07-30 MED ORDER — PIPERACILLIN-TAZOBACTAM 3.375 G IVPB 30 MIN: 3.3750 g | Freq: Once | INTRAVENOUS | Status: DC

## 2015-07-30 MED ORDER — VANCOMYCIN HCL IN DEXTROSE 1-5 GM/200ML-% IV SOLN
1000.0000 mg | Freq: Once | INTRAVENOUS | Status: AC
  Administered 2015-07-30: 1000 mg via INTRAVENOUS
  Filled 2015-07-30: qty 200

## 2015-07-30 MED ORDER — DEXTROSE 5 % IV SOLN
2.0000 g | Freq: Three times a day (TID) | INTRAVENOUS | Status: DC
  Administered 2015-07-30 – 2015-08-01 (×7): 2 g via INTRAVENOUS
  Filled 2015-07-30 (×9): qty 2

## 2015-07-30 MED ORDER — PANTOPRAZOLE SODIUM 40 MG PO TBEC
40.0000 mg | DELAYED_RELEASE_TABLET | Freq: Every day | ORAL | Status: DC
  Administered 2015-07-31 – 2015-08-03 (×4): 40 mg via ORAL
  Filled 2015-07-30 (×5): qty 1

## 2015-07-30 MED ORDER — ONDANSETRON HCL 4 MG/2ML IJ SOLN: 4.0000 mg | Freq: Four times a day (QID) | INTRAMUSCULAR | Status: DC | PRN

## 2015-07-30 MED ORDER — METOPROLOL SUCCINATE ER 100 MG PO TB24
100.0000 mg | ORAL_TABLET | Freq: Every day | ORAL | Status: DC
  Administered 2015-07-31 – 2015-08-03 (×4): 100 mg via ORAL
  Filled 2015-07-30: qty 2
  Filled 2015-07-30: qty 1
  Filled 2015-07-30: qty 2
  Filled 2015-07-30: qty 1

## 2015-07-30 MED ORDER — SODIUM POLYSTYRENE SULFONATE 15 GM/60ML PO SUSP
30.0000 g | Freq: Once | ORAL | Status: DC
  Filled 2015-07-30: qty 120

## 2015-07-30 MED ORDER — LORAZEPAM 2 MG/ML IJ SOLN
2.0000 mg | Freq: Once | INTRAMUSCULAR | Status: AC
  Administered 2015-07-30: 2 mg via INTRAVENOUS
  Filled 2015-07-30: qty 1

## 2015-07-30 MED ORDER — ONDANSETRON HCL 4 MG PO TABS: 4.0000 mg | ORAL_TABLET | Freq: Four times a day (QID) | ORAL | Status: DC | PRN

## 2015-07-30 MED ORDER — QUETIAPINE FUMARATE 25 MG PO TABS
25.0000 mg | ORAL_TABLET | Freq: Every day | ORAL | Status: DC
  Administered 2015-07-30 – 2015-08-01 (×3): 25 mg via ORAL
  Filled 2015-07-30 (×3): qty 1

## 2015-07-30 MED ORDER — AZTREONAM 2 G IJ SOLR
2.0000 g | Freq: Once | INTRAMUSCULAR | Status: AC
  Administered 2015-07-30: 2 g via INTRAVENOUS
  Filled 2015-07-30: qty 2

## 2015-07-30 MED ORDER — DOXYCYCLINE HYCLATE 100 MG IV SOLR
100.0000 mg | Freq: Two times a day (BID) | INTRAVENOUS | Status: DC
  Administered 2015-07-30 – 2015-08-01 (×5): 100 mg via INTRAVENOUS
  Filled 2015-07-30 (×7): qty 100

## 2015-07-30 MED ORDER — HALOPERIDOL LACTATE 5 MG/ML IJ SOLN
2.0000 mg | Freq: Four times a day (QID) | INTRAMUSCULAR | Status: DC | PRN
  Administered 2015-07-30 (×2): 2 mg via INTRAVENOUS
  Filled 2015-07-30 (×2): qty 1

## 2015-07-30 MED ORDER — TAMSULOSIN HCL 0.4 MG PO CAPS
0.4000 mg | ORAL_CAPSULE | Freq: Every day | ORAL | Status: DC
  Administered 2015-07-31 – 2015-08-03 (×4): 0.4 mg via ORAL
  Filled 2015-07-30 (×4): qty 1

## 2015-07-30 MED ORDER — VANCOMYCIN HCL IN DEXTROSE 1-5 GM/200ML-% IV SOLN
1000.0000 mg | INTRAVENOUS | Status: DC
  Filled 2015-07-30: qty 200

## 2015-07-30 MED ORDER — INSULIN ASPART 100 UNIT/ML ~~LOC~~ SOLN
0.0000 [IU] | SUBCUTANEOUS | Status: DC
  Administered 2015-07-30: 1 [IU] via SUBCUTANEOUS
  Administered 2015-07-30: 2 [IU] via SUBCUTANEOUS
  Administered 2015-07-30: 1 [IU] via SUBCUTANEOUS
  Administered 2015-07-31: 2 [IU] via SUBCUTANEOUS
  Administered 2015-07-31: 1 [IU] via SUBCUTANEOUS
  Administered 2015-07-31 – 2015-08-01 (×3): 2 [IU] via SUBCUTANEOUS
  Administered 2015-08-01: 3 [IU] via SUBCUTANEOUS
  Administered 2015-08-02: 1 [IU] via SUBCUTANEOUS
  Administered 2015-08-02: 2 [IU] via SUBCUTANEOUS

## 2015-07-30 MED ORDER — LORAZEPAM 2 MG/ML IJ SOLN
1.0000 mg | Freq: Once | INTRAMUSCULAR | Status: AC
  Administered 2015-07-30: 1 mg via INTRAVENOUS
  Filled 2015-07-30: qty 1

## 2015-07-30 NOTE — Progress Notes (Signed)
ANTIBIOTIC CONSULT NOTE - INITIAL  Pharmacy Consult for Vancomycin/Aztreonam  Indication: rule out sepsis  Allergies  Allergen Reactions  . Penicillins     Unknown. Has patient had a PCN reaction causing immediate rash, facial/tongue/throat swelling, SOB or lightheadedness with hypotension: N/A Has patient had a PCN reaction causing severe rash involving mucus membranes or skin necrosis: N/A Has patient had a PCN reaction that required hospitalization N/A Has patient had a PCN reaction occurring within the last 10 years: N/A If all of the above answers are "NO", then may proceed with Cephalosporin use.    Patient Measurements: Height: 6\' 2"  (188 cm) Weight: 210 lb 1.6 oz (95.3 kg) IBW/kg (Calculated) : 82.2  Vital Signs: Temp: 97.5 F (36.4 C) (12/24 0232) Temp Source: Oral (12/24 0232) BP: 151/86 mmHg (12/24 0232) Pulse Rate: 85 (12/24 0232)  Labs:  Recent Labs  07/29/15 1930 07/29/15 1947 07/30/15 0002  WBC 3.8*  --   --   HGB 9.4* 9.2*  --   PLT 54*  --   --   CREATININE 2.48* 2.50* 2.29*   Estimated Creatinine Clearance: 44.4 mL/min (by C-G formula based on Cr of 2.29).  Medical History: Past Medical History  Diagnosis Date  . Diabetes mellitus without complication [facility])     Assessment: 49 y/o M with R-BKA and left chronic foot wounds here with fall, starting broad spectrum anti-biotics for the wounds/osteo and r/o sepsis. Appears to have been receiving Linezolid at [facility]. WBC 3.8, noted renal dysfunction.   Goal of Therapy:  Vancomycin trough level 15-20 mcg/ml  Plan:  -Vancomycin 1000 mg IV q24h, will need dose increase if renal function improves -Aztreonam 2g IV q8h -Trend WBC, temp, renal function  -Drug levels as indicated   Narda Bonds 07/30/2015,3:51 AM

## 2015-07-30 NOTE — Progress Notes (Signed)
When patient awakes he  Tries to get up and he pulls at his line does not understand that he has on a condom catheter bv because of his  Incontinence. Oriented each time to care and care procedures, patient just screams and hit at staff. Will continue to monitor patient sitter at beside

## 2015-07-30 NOTE — H&P (Signed)
[facility] History and Physical  Eduardo Stewart [id number] DOB: 03/08/1966 DOA: 07/29/2015  Referring physician: ED PCP: Leola Brazil, MD   Chief Complaint: Fall with laceration  HPI:   Eduardo Stewart is a 49 year old male who was a patient of [facility] with past medical history of diabetes, right BKA, chronic left foot wound and a question of osteomyelitis; presents after having a fall out of his wheelchair sustaining a lip laceration. History is significantly limited as records from [facility] not able to be obtained. After patient was transferred here and after evaluation he was noted to be slightly altered as well. Patient apparently came with a PICC line which he ripped out and his altered state while the ED physician was trying to repair his lip laceration. Per report he had not been having any fever or chills however wear of and patient cannot contribute to his history at this time I see is more altered having to have received Ativan for the them to repair his upper lip laceration. It also appears that the patient had possible renal insufficiency secondary to Zyvox toxicity.   Review of Systems  Unable to perform ROS: mental status change    Past Medical History  Diagnosis Date  . Diabetes mellitus without complication ([place])      No past surgical history on file.    Social History:  reports that he has quit smoking. He does not have any smokeless tobacco history on file. He reports that he does not drink alcohol or use illicit drugs. Were does patient live--resided a [facility]   Allergies  Allergen Reactions  . Penicillins     Unknown. Has patient had a PCN reaction causing immediate rash, facial/tongue/throat swelling, SOB or lightheadedness with hypotension: N/A Has patient had a PCN reaction causing severe rash involving mucus membranes or skin necrosis: N/A Has patient had a PCN reaction that required hospitalization N/A Has patient had a PCN reaction occurring  within the last 10 years: N/A If all of the above answers are "NO", then may proceed with Cephalosporin use.     No family history on file.     Prior to Admission medications   Medication Sig Start Date End Date Taking? Authorizing Provider  atorvastatin (LIPITOR) 40 MG tablet Take 40 mg by mouth daily.   Yes Historical Provider, MD  cyanocobalamin 1000 MCG tablet Take 100 mcg by mouth daily.   Yes Historical Provider, MD  DULoxetine (CYMBALTA) 60 MG capsule Take 60 mg by mouth every 12 (twelve) hours.   Yes Historical Provider, MD  fenofibrate (TRICOR) 48 MG tablet Take 48 mg by mouth at bedtime.   Yes Historical Provider, MD  ferrous sulfate 325 (65 FE) MG tablet Take 325 mg by mouth daily with breakfast.   Yes Historical Provider, MD  FLUoxetine (PROZAC) 20 MG capsule Take 20 mg by mouth at bedtime.   Yes Historical Provider, MD  furosemide (LASIX) 40 MG tablet Take 40 mg by mouth daily.   Yes Historical Provider, MD  gabapentin (NEURONTIN) 300 MG capsule Take 300 mg by mouth every 8 (eight) hours.   Yes Historical Provider, MD  ibuprofen (ADVIL,MOTRIN) 400 MG tablet Take 400 mg by mouth at bedtime.   Yes Historical Provider, MD  insulin detemir (LEVEMIR) 100 UNIT/ML injection Inject 15 Units into the skin every 12 (twelve) hours.   Yes Historical Provider, MD  isosorbide mononitrate (IMDUR) 60 MG 24 hr tablet Take 60 mg by mouth daily.   Yes Historical Provider, MD  metoprolol succinate (TOPROL-XL) 100 MG 24 hr tablet Take 100 mg by mouth daily. Take with or immediately following a meal.   Yes Historical Provider, MD  omega-3 acid ethyl esters (LOVAZA) 1 G capsule Take 1 g by mouth daily.   Yes Historical Provider, MD  OXcarbazepine (TRILEPTAL) 150 MG tablet Take 150 mg by mouth every 12 (twelve) hours.   Yes Historical Provider, MD  pantoprazole (PROTONIX) 40 MG tablet Take 40 mg by mouth daily.   Yes Historical Provider, MD  pregabalin (LYRICA) 200 MG capsule Take 200 mg by mouth  every 12 (twelve) hours.   Yes Historical Provider, MD  ranolazine (RANEXA) 500 MG 12 hr tablet Take 500 mg by mouth 2 (two) times daily.   Yes Historical Provider, MD  tamsulosin (FLOMAX) 0.4 MG CAPS capsule Take 0.4 mg by mouth daily.   Yes Historical Provider, MD     Physical Exam: Filed Vitals:   07/30/15 0424 07/30/15 0900 07/30/15 1100 07/30/15 1515  BP: 143/94 118/65 143/93 133/74  Pulse: 83  84 89  Temp: 97.5 F (36.4 C)  97.2 F (36.2 C) 98 F (36.7 C)  TempSrc: Axillary  Oral Oral  Resp: 12  13 13   Height:      Weight:      SpO2: 100%  98% 99%     Constitutional: Vital signs reviewed. Patient is obese male who appears to be lethargic, arousable to verbal stimuli, but non-communicative at this time. Head: Normocephalic and atraumatic  Ear: TM normal bilaterally  Mouth: no erythema or exudates, bleeding from a upper lip laceration with stitches present Eyes: PERRL, EOMI, conjunctivae normal, No scleral icterus.  Neck: Supple, Trachea midline normal ROM, No JVD, mass, thyromegaly, or carotid bruit present.  Cardiovascular: RRR, S1 normal, S2 normal, no MRG, pulses symmetric and intact bilaterally  Pulmonary/Chest: CTAB, no wheezes, rales, or rhonchi  Abdominal: Soft. Non-tender, non-distended, bowel sounds are normal, no masses, organomegaly, or guarding present.  GU: no CVA tenderness Musculoskeletal: No joint deformities, erythema, or stiffness, ROM full and no nontender Ext: Right BKA left toe wound on the medial aspect of dorsum of foot approximately a nickel sized ulcer that appears to be healing Hematology: no cervical, inginal, or axillary adenopathy.  Neurological: Lethargic . GCS (E)3 (V)2  (M)5 Able to move all extremities Skin: Warm, dry and intact. No rash, cyanosis, or clubbing.  Psychiatric: Normal mood and affect. speech and behavior is normal. Judgment and thought content normal. Cognition and memory are normal.      Data Review   Micro  Results Recent Results (from the past 240 hour(s))  Blood culture (routine x 2)     Status: None (Preliminary result)   Collection Time: 07/29/15  7:30 PM  Result Value Ref Range Status   Specimen Description BLOOD RIGHT HAND  Final   Special Requests BOTTLES DRAWN AEROBIC ONLY [place]  Final   Culture NO GROWTH < 24 HOURS  Final   Report Status PENDING  Incomplete  Blood culture (routine x 2)     Status: None (Preliminary result)   Collection Time: 07/29/15  8:22 PM  Result Value Ref Range Status   Specimen Description BLOOD LEFT HAND  Final   Special Requests BOTTLES DRAWN AEROBIC AND ANAEROBIC 4CC  Final   Culture NO GROWTH < 24 HOURS  Final   Report Status PENDING  Incomplete  MRSA PCR Screening     Status: None   Collection Time: 07/30/15  2:48 AM  Result Value  Ref Range Status   MRSA by PCR NEGATIVE NEGATIVE Final    Comment:        The GeneXpert MRSA Assay (FDA approved for NASAL specimens only), is one component of a comprehensive MRSA colonization surveillance program. It is not intended to diagnose MRSA infection nor to guide or monitor treatment for MRSA infections.     Radiology Reports Ct Head Wo Contrast  07/30/2015  CLINICAL DATA:  49 year old male with fall and altered mental status EXAM: CT HEAD WITHOUT CONTRAST TECHNIQUE: Contiguous axial images were obtained from the base of the skull through the vertex without intravenous contrast. COMPARISON:  CT dated 07/29/2015 FINDINGS: The ventricles and sulci are appropriate in size patient's age. Mild periventricular and deep white matter hypodensities represent chronic microvascular ischemic changes. There is no intracranial hemorrhage. No mass effect or midline shift identified. There is diffuse mucoperiosteal thickening and partial opacification of the paranasal sinuses. The mastoid air cells are well aerated. The calvarium is intact. IMPRESSION: No acute intracranial hemorrhage. Mild chronic microvascular ischemic  disease. If symptoms persist and there are no contraindications, MRI may provide better evaluation if clinically indicated Paranasal sinus disease. Electronically Signed   By: Anner Crete M.D.   On: 07/30/2015 01:23   Ct Head Wo Contrast  07/29/2015  CLINICAL DATA:  Altered mental status post fall onto face EXAM: CT HEAD WITHOUT CONTRAST TECHNIQUE: Contiguous axial images were obtained from the base of the skull through the vertex without intravenous contrast. COMPARISON:  None. FINDINGS: Mucoperiosteal thickening in frontal sinuses. Extensive opacification of ethmoid air cells. There is no evidence of acute intracranial hemorrhage, brain edema, mass lesion, acute infarction, mass effect, or midline shift. Acute infarct may be inapparent on noncontrast CT. No other intra-axial abnormalities are seen, and the ventricles and sulci are within normal limits in size and symmetry. No abnormal extra-axial fluid collections or masses are identified. No significant calvarial abnormality. IMPRESSION: 1. Negative for bleed or other acute intracranial process. Electronically Signed   By: Lucrezia Europe M.D.   On: 07/29/2015 20:47   Mr Brain Wo Contrast  07/30/2015  CLINICAL DATA:  Wound infection.  Diabetes.  Recent falls. EXAM: MRI HEAD WITHOUT CONTRAST TECHNIQUE: Multiplanar, multiecho pulse sequences of the brain and surrounding structures were obtained without intravenous contrast. COMPARISON:  CT head 07/30/2015 FINDINGS: Image quality degraded by motion. Ventricle size normal.  Cerebral volume normal. Negative for acute infarction.  No significant chronic ischemia. Negative for hemorrhage or fluid collection Negative for mass or edema.  No shift of the midline structures. Extensive mucosal edema in the paranasal sinuses bilaterally without air-fluid level. Pituitary normal in size. IMPRESSION: Image quality degraded by motion. Allowing for this, no significant intracranial abnormality Extensive mucosal edema  throughout the paranasal sinuses compatible with chronic sinusitis. Electronically Signed   By: Franchot Gallo M.D.   On: 07/30/2015 11:22   Dg Chest Port 1 View  07/29/2015  CLINICAL DATA:  Fall from a wheelchair today. EXAM: PORTABLE CHEST 1 VIEW COMPARISON:  None. FINDINGS: The mediastinal contour is normal. The heart size is enlarged. A right-sided venous line is identified with distal tip in the superior vena cava. There is a minimal left pleural effusion. There is no pulmonary edema or focal pneumonia. No acute fracture or dislocation is identified in the visualized bones. IMPRESSION: Minimal left pleural effusion.  No focal pneumonia. Electronically Signed   By: Abelardo Diesel M.D.   On: 07/29/2015 19:35   Dg Foot Complete Left  07/30/2015  CLINICAL DATA:  Wound infection. EXAM: LEFT FOOT - COMPLETE 3+ VIEW COMPARISON:  None. FINDINGS: No fracture or dislocation of mid foot or forefoot. The phalanges are normal. The calcaneus is normal. No soft tissue abnormality. No osseous erosion of the cortices to suggest osteomyelitis. IMPRESSION: No fracture or dislocation.  No osteomyelitis evident. Electronically Signed   By: Suzy Bouchard M.D.   On: 07/30/2015 11:07     CBC  Recent Labs Lab 07/29/15 1930 07/29/15 1947 07/30/15 0357  WBC 3.8*  --  3.6*  3.9*  HGB 9.4* 9.2* 7.8*  8.2*  HCT 28.0* 27.0* 23.4*  24.4*  PLT 54*  --  48*  49*  MCV 85.6  --  86.0  86.5  MCH 28.7  --  28.7  29.1  MCHC 33.6  --  33.3  33.6  RDW 13.7  --  13.8  13.7  LYMPHSABS 0.5*  --  0.6*  MONOABS 0.3  --  0.2  EOSABS 0.2  --  0.1  BASOSABS 0.0  --  0.0    Chemistries   Recent Labs Lab 07/29/15 1930 07/29/15 1947 07/30/15 0002 07/30/15 0357  NA 138 136 139 140  K 6.3* 5.9* 5.3* 5.4*  CL 97* 96* 99* 101  CO2 30  --  31 33*  GLUCOSE 178* 166* 144* 169*  BUN 48* 47* 45* 40*  CREATININE 2.48* 2.50* 2.29* 2.05*  CALCIUM 9.4  --  9.1 9.0  AST 22  --   --   --   ALT 13*  --   --   --    ALKPHOS 41  --   --   --   BILITOT 0.5  --   --   --    ------------------------------------------------------------------------------------------------------------------ estimated creatinine clearance is 49.6 mL/min (by C-G formula based on Cr of 2.05). ------------------------------------------------------------------------------------------------------------------ No results for input(s): HGBA1C in the last 72 hours. ------------------------------------------------------------------------------------------------------------------ No results for input(s): CHOL, HDL, LDLCALC, TRIG, CHOLHDL, LDLDIRECT in the last 72 hours. ------------------------------------------------------------------------------------------------------------------  Recent Labs  07/30/15 0357  TSH 3.669   ------------------------------------------------------------------------------------------------------------------ No results for input(s): VITAMINB12, FOLATE, FERRITIN, TIBC, IRON, RETICCTPCT in the last 72 hours.  Coagulation profile  Recent Labs Lab 07/29/15 1930  INR 1.37    No results for input(s): DDIMER in the last 72 hours.  Cardiac Enzymes No results for input(s): CKMB, TROPONINI, MYOGLOBIN in the last 168 hours.  Invalid input(s): CK ------------------------------------------------------------------------------------------------------------------ Invalid input(s): POCBNP   CBG:  Recent Labs Lab 07/29/15 1903 07/29/15 2318 07/30/15 0413 07/30/15 1144 07/30/15 1620  GLUCAP 162* 124* 145* 177* 137*       EKG: Independently reviewed. Sinus rhythm with a prolonged PR interval 260 Assessment/Plan Principal Problem:    Acute encephalopathy: Patient altered on arrival with the addition of recent Ativan given for sedation versus the possibility of underlying infection. Patient with a mildly elevated lactic acid level 2.79, WBC 3.8. Urine drug screen positive for opiates only, PCO2  within normal limits.  - Admit to stepdown unit for closer monitoring - Frequent Neuro checks - Will need to obtain records from [facility] - Empiric antibiotics of vancomycin and aztreonam due to penicillin allergy - Check blood cultures 2, Trileptal level(held med will need to restart), trend lactic acid level - Consider neurology consult if continued to be altered - Care management consult  Hyperkalemia: Acute. Potassium on arrival was 6.3. Patient was given calcium gluconate and insulin question of changes on EKG no previous EKGs to compare.  - Recheck and treat  as needed  Question Cardiac history with what appears to be congestive heart failure and CAD based off medication list including metoprolol, Ranexa, and isosorbide mononitrate. - question fluid status check BNP with minimal left pleural effusion   - Need records: continue isosorbide mononitrate, metoprolol, Ranexa  Question of AKI on CKD: Patient baseline unknown at this time. On arrival BUN 48 creatinine 2.48 - IVF Ns at 59ml /hr - Follow-up repeat BMP   Thrombocytopenia ([place]): Platelet count 54.  - Held on starting DVT prophylaxis with heparin, SCDs for now - Follow-up cbc  Normocytic anemia - Continue to monitor recheck CBC in a.m.  Wound of left lower extremity: Wound appears to be healing. xray no fracture or clear evidence of osteomyelitis seen. - Question need of orthopedic consult - Consult wound care    Diabetes mellitus type 2 in obese [facility]): Blood glucose initially elevated at 178 -CBG checks every 4 hours was sensitive sliding scale of insulin - Follow-up records  Lip laceration s/p stitching - Continue to monitor given low platelet counts  Prolong PR interval: Prolong at 260. Unknown cause or baseline  - continue to monitor  Peripheral vascular disease: stable    Right BKA: Stable    Code Status:   full Family Communication: bedside Disposition Plan: admit   Total time spent 55  minutes.Greater than 50% of this time was spent in counseling, explanation of diagnosis, planning of further management, and coordination of care  [facility] Pager [phone]  If 7PM-7AM, please contact night-coverage [email or web address] Password [facility] 07/30/2015, 6:17 PM

## 2015-07-30 NOTE — Progress Notes (Signed)
Patient really combative with staff , trying to get out of bed , security had to be called , MD notified and Haldol given as ordered

## 2015-07-30 NOTE — Progress Notes (Signed)
Patient  Will answer and knows his name but when he is awake very combative and will not stay in the bed safety sitter at bedside

## 2015-07-30 NOTE — Progress Notes (Signed)
Patient admitted from ED via stretcher with RN and NCA at 0200. Patient was recently given ativan and was sedated when he arrived. Patient unable to follow commands due to LOC. See echarting for additional data.

## 2015-07-30 NOTE — Progress Notes (Signed)
[facility] PROGRESS NOTE  Eduardo Stewart [id number] DOB: 10-30-1965 DOA: 07/29/2015  PCP: Leola Brazil, MD  Brief HPI: 49 year old Caucasian male was brought in from [facility] for altered mental status. Patient is at that facility for his chronic lower extremity wounds and osteomyelitis. He is apparently getting antibiotics for same. He apparently fell from wheelchair and struck his head on the ground. He was found to be confused. He was hospitalized for further management. Records from that facility are not available yet.  Consultants: None yet  Procedures: None  Antibiotics: Aztreonam and doxycycline   Subjective: Patient is confused. Unable to provide any history. Discussed with his wife briefly. Apparently he was getting vancomycin initially for his foot infection. However, he developed renal failure. This was followed by initiation of linezolid.  Objective: Vital Signs  Filed Vitals:   07/30/15 0232 07/30/15 0424 07/30/15 0900 07/30/15 1100  BP: 151/86 143/94 118/65 143/93  Pulse: 85 83  84  Temp: 97.5 F (36.4 C) 97.5 F (36.4 C)  97.2 F (36.2 C)  TempSrc: Oral Axillary  Oral  Resp: 15 12  13   Height: 6\' 2"  (1.88 m)     Weight: 95.3 kg (210 lb 1.6 oz)     SpO2: 100% 100%  98%    Intake/Output Summary (Last 24 hours) at 07/30/15 1408 Last data filed at 07/30/15 0700  Gross per 24 hour  Intake 534.25 ml  Output   1100 ml  Net -565.75 ml   Filed Weights   07/30/15 0232  Weight: 95.3 kg (210 lb 1.6 oz)    General appearance: alert, appears stated age, combative, distracted and no distress Resp: Diminished air entry at the bases. No definite crackles, wheezing or rhonchi. Cardio: regular rate and rhythm, S1, S2 normal, no murmur, click, rub or gallop GI: soft, non-tender; bowel sounds normal; no masses,  no organomegaly Extremities: Left lower extremity covered in a dressing. Neurologic: He is awake. Distracted. Trying to  climb out of the bed. Moving all his extremities. No facial asymmetry.  Lab Results:  Basic Metabolic Panel:  Recent Labs Lab 07/29/15 1930 07/29/15 1947 07/30/15 0002 07/30/15 0357  NA 138 136 139 140  K 6.3* 5.9* 5.3* 5.4*  CL 97* 96* 99* 101  CO2 30  --  31 33*  GLUCOSE 178* 166* 144* 169*  BUN 48* 47* 45* 40*  CREATININE 2.48* 2.50* 2.29* 2.05*  CALCIUM 9.4  --  9.1 9.0   Liver Function Tests:  Recent Labs Lab 07/29/15 1930  AST 22  ALT 13*  ALKPHOS 41  BILITOT 0.5  PROT 6.7  ALBUMIN 3.8   CBC:  Recent Labs Lab 07/29/15 1930 07/29/15 1947 07/30/15 0357  WBC 3.8*  --  3.6*  3.9*  NEUTROABS 2.7  --  2.6  HGB 9.4* 9.2* 7.8*  8.2*  HCT 28.0* 27.0* 23.4*  24.4*  MCV 85.6  --  86.0  86.5  PLT 54*  --  48*  49*   BNP (last 3 results)  Recent Labs  07/30/15 0357  BNP 550.4*     CBG:  Recent Labs Lab 07/29/15 1903 07/29/15 2318 07/30/15 0413 07/30/15 1144  GLUCAP 162* 124* 145* 177*    Recent Results (from the past 240 hour(s))  Blood culture (routine x 2)     Status: None (Preliminary result)   Collection Time: 07/29/15  7:30 PM  Result Value Ref Range Status   Specimen Description BLOOD RIGHT HAND  Final  Special Requests BOTTLES DRAWN AEROBIC ONLY [place]  Final   Culture NO GROWTH < 24 HOURS  Final   Report Status PENDING  Incomplete  Blood culture (routine x 2)     Status: None (Preliminary result)   Collection Time: 07/29/15  8:22 PM  Result Value Ref Range Status   Specimen Description BLOOD LEFT HAND  Final   Special Requests BOTTLES DRAWN AEROBIC AND ANAEROBIC 4CC  Final   Culture NO GROWTH < 24 HOURS  Final   Report Status PENDING  Incomplete  MRSA PCR Screening     Status: None   Collection Time: 07/30/15  2:48 AM  Result Value Ref Range Status   MRSA by PCR NEGATIVE NEGATIVE Final    Comment:        The GeneXpert MRSA Assay (FDA approved for NASAL specimens only), is one component of a comprehensive MRSA  colonization surveillance program. It is not intended to diagnose MRSA infection nor to guide or monitor treatment for MRSA infections.       Studies/Results: Ct Head Wo Contrast  07/30/2015  CLINICAL DATA:  49 year old male with fall and altered mental status EXAM: CT HEAD WITHOUT CONTRAST TECHNIQUE: Contiguous axial images were obtained from the base of the skull through the vertex without intravenous contrast. COMPARISON:  CT dated 07/29/2015 FINDINGS: The ventricles and sulci are appropriate in size patient's age. Mild periventricular and deep white matter hypodensities represent chronic microvascular ischemic changes. There is no intracranial hemorrhage. No mass effect or midline shift identified. There is diffuse mucoperiosteal thickening and partial opacification of the paranasal sinuses. The mastoid air cells are well aerated. The calvarium is intact. IMPRESSION: No acute intracranial hemorrhage. Mild chronic microvascular ischemic disease. If symptoms persist and there are no contraindications, MRI may provide better evaluation if clinically indicated Paranasal sinus disease. Electronically Signed   By: Anner Crete M.D.   On: 07/30/2015 01:23   Ct Head Wo Contrast  07/29/2015  CLINICAL DATA:  Altered mental status post fall onto face EXAM: CT HEAD WITHOUT CONTRAST TECHNIQUE: Contiguous axial images were obtained from the base of the skull through the vertex without intravenous contrast. COMPARISON:  None. FINDINGS: Mucoperiosteal thickening in frontal sinuses. Extensive opacification of ethmoid air cells. There is no evidence of acute intracranial hemorrhage, brain edema, mass lesion, acute infarction, mass effect, or midline shift. Acute infarct may be inapparent on noncontrast CT. No other intra-axial abnormalities are seen, and the ventricles and sulci are within normal limits in size and symmetry. No abnormal extra-axial fluid collections or masses are identified. No significant  calvarial abnormality. IMPRESSION: 1. Negative for bleed or other acute intracranial process. Electronically Signed   By: Lucrezia Europe M.D.   On: 07/29/2015 20:47   Mr Brain Wo Contrast  07/30/2015  CLINICAL DATA:  Wound infection.  Diabetes.  Recent falls. EXAM: MRI HEAD WITHOUT CONTRAST TECHNIQUE: Multiplanar, multiecho pulse sequences of the brain and surrounding structures were obtained without intravenous contrast. COMPARISON:  CT head 07/30/2015 FINDINGS: Image quality degraded by motion. Ventricle size normal.  Cerebral volume normal. Negative for acute infarction.  No significant chronic ischemia. Negative for hemorrhage or fluid collection Negative for mass or edema.  No shift of the midline structures. Extensive mucosal edema in the paranasal sinuses bilaterally without air-fluid level. Pituitary normal in size. IMPRESSION: Image quality degraded by motion. Allowing for this, no significant intracranial abnormality Extensive mucosal edema throughout the paranasal sinuses compatible with chronic sinusitis. Electronically Signed   By: Franchot Gallo  M.D.   On: 07/30/2015 11:22   Dg Chest Port 1 View  07/29/2015  CLINICAL DATA:  Fall from a wheelchair today. EXAM: PORTABLE CHEST 1 VIEW COMPARISON:  None. FINDINGS: The mediastinal contour is normal. The heart size is enlarged. A right-sided venous line is identified with distal tip in the superior vena cava. There is a minimal left pleural effusion. There is no pulmonary edema or focal pneumonia. No acute fracture or dislocation is identified in the visualized bones. IMPRESSION: Minimal left pleural effusion.  No focal pneumonia. Electronically Signed   By: Abelardo Diesel M.D.   On: 07/29/2015 19:35   Dg Foot Complete Left  07/30/2015  CLINICAL DATA:  Wound infection. EXAM: LEFT FOOT - COMPLETE 3+ VIEW COMPARISON:  None. FINDINGS: No fracture or dislocation of mid foot or forefoot. The phalanges are normal. The calcaneus is normal. No soft tissue  abnormality. No osseous erosion of the cortices to suggest osteomyelitis. IMPRESSION: No fracture or dislocation.  No osteomyelitis evident. Electronically Signed   By: Suzy Bouchard M.D.   On: 07/30/2015 11:07    Medications:  Scheduled: . aztreonam  2 g Intravenous 3 times per day  . doxycycline (VIBRAMYCIN) IV  100 mg Intravenous Q12H  . insulin aspart  0-9 Units Subcutaneous 6 times per day  . isosorbide mononitrate  60 mg Oral Daily  . metoprolol succinate  100 mg Oral Daily  . pantoprazole  40 mg Oral Daily  . ranolazine  500 mg Oral BID  . sodium chloride  3 mL Intravenous Q12H  . sodium polystyrene  30 g Oral Once  . tamsulosin  0.4 mg Oral Daily   Continuous: . sodium chloride 100 mL/hr at 07/30/15 1130   [id number] lactate, ondansetron **OR** ondansetron (ZOFRAN) IV  Assessment/Plan:  Active Problems:   Acute encephalopathy    Acute encephalopathy Etiology is unclear. MRI brain did not show any acute findings, although the study was degraded by motion. Reports from [facility] suggest that patient's mental status has been the same ever since he's been there, which has been about a week. May need to get neurology input if there is no improvement by tomorrow. Order EEG, RPR, B-12. TSH is normal.  Likely acute on chronic renal failure with hyperkalemia Creatinine slowly improving. His baseline is not clearly known. Kayexalate to be given for elevated potassium. Repeat labs tomorrow morning. Monitor Urine output. Consider renal ultrasound.  Normocytic anemia No obvious overt bleeding. Check anemia panel  Thrombocytopenia Etiology unclear. Could be secondary to the linezolid that he has been on at the nursing facility. Based on care everywhere platelet counts were normal last year at [facility].  Chronic lower extremity wound with question of osteomyelitis X-ray of the left foot here does not show any evidence for osteomyelitis. Patient was on linezolid at the  nursing facility, which is being held due to significant thrombocytopenia. Continue doxycycline and aztreonam for now. Consider MRI of the left foot. Wound care nurse has been consulted.  History of diabetes mellitus, type II on insulin Continue sliding scale coverage. Check HbA1c.  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 Continue to monitor.  History of coronary artery disease and chronic systolic CHF History of cardiomyopathy with EF of 35% as of 2014 based on careeverywhere. 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 We'll continue home medications when able. Continue to monitor closely. He appears to be reasonably well compensated.   DVT Prophylaxis: SCDs  Code Status: Full code  Family Communication: Discussed with his wife  Disposition Plan: Continue treatment as outlined above. Continue to monitor closely in the stepdown setting.    LOS: 0 days   [facility] Pager [phone] 07/30/2015, 2:08 PM  If 7PM-7AM, please contact night-coverage at [email or web address], password [facility]

## 2015-07-30 NOTE — Progress Notes (Signed)
Attempted ABG x2, unsuccessful. Spoke with [facility] and cancelled ABG for patient.

## 2015-07-30 NOTE — ED Notes (Addendum)
This RN walked into the pt's room after hearing the alaris pump going off and found the pt sitting up in the floor on his buttocks. Blood drops were noted on the bed and the floor and a moderate amount of blood stain was noted on the pt's right sleeve of gown. The pt pulled his picc line out of his right arm. 4x4 applied and pressure held to stop bleeding. Coban wrapped around a sterile 4x4 gauze over area where picc line was placed. Picc line appeared to be completely intact. Pt has a small hematoma noted to the right eye brow. Dr Veto Kemps resident in room with this RN, Domenick Bookbinder EMT and Karma Ganja RN. Pt in the same mental status as prior to fall. Pt had not tried to get out of bed prior to this event. Pt had fall risk bracelet on but did not have yellow socks on due to the fact that the pt has a bandage wrapped around the left foot in which he is being treated for an infection presently at [facility] and the pt has a Right BKA. Pt had call light in reach and side rails up. Dr Veto Kemps and this RN performing post fall assessment and CT scan to be ordered due to hematoma. After getting pt back in the bed the pt stated "where am I and what is going on?" Pt was disoriented to place and situation prior to fall. Bed alarm now in place and safety sitter in room with pt.

## 2015-07-31 DIAGNOSIS — I739 Peripheral vascular disease, unspecified: Secondary | ICD-10-CM

## 2015-07-31 DIAGNOSIS — N179 Acute kidney failure, unspecified: Secondary | ICD-10-CM

## 2015-07-31 LAB — GLUCOSE, CAPILLARY
GLUCOSE-CAPILLARY: 109 mg/dL — AB (ref 65–99)
GLUCOSE-CAPILLARY: 111 mg/dL — AB (ref 65–99)
GLUCOSE-CAPILLARY: 131 mg/dL — AB (ref 65–99)
GLUCOSE-CAPILLARY: 160 mg/dL — AB (ref 65–99)
GLUCOSE-CAPILLARY: 192 mg/dL — AB (ref 65–99)
GLUCOSE-CAPILLARY: 96 mg/dL (ref 65–99)

## 2015-07-31 LAB — HIV ANTIBODY (ROUTINE TESTING W REFLEX): HIV Screen 4th Generation wRfx: NONREACTIVE

## 2015-07-31 LAB — RPR: RPR Ser Ql: NONREACTIVE

## 2015-07-31 LAB — COMPREHENSIVE METABOLIC PANEL
ALT: 12 U/L — AB (ref 17–63)
AST: 20 U/L (ref 15–41)
Albumin: 3.1 g/dL — ABNORMAL LOW (ref 3.5–5.0)
Alkaline Phosphatase: 38 U/L (ref 38–126)
Anion gap: 7 (ref 5–15)
BUN: 24 mg/dL — AB (ref 6–20)
CHLORIDE: 103 mmol/L (ref 101–111)
CO2: 30 mmol/L (ref 22–32)
CREATININE: 1.55 mg/dL — AB (ref 0.61–1.24)
Calcium: 8.9 mg/dL (ref 8.9–10.3)
GFR calc Af Amer: 58 mL/min — ABNORMAL LOW (ref >60)
GFR calc non Af Amer: 50 mL/min — ABNORMAL LOW (ref >60)
Glucose, Bld: 134 mg/dL — ABNORMAL HIGH (ref 65–99)
Potassium: 4.7 mmol/L (ref 3.5–5.1)
SODIUM: 140 mmol/L (ref 135–145)
Total Bilirubin: 0.8 mg/dL (ref 0.3–1.2)
Total Protein: 6.2 g/dL — ABNORMAL LOW (ref 6.5–8.1)

## 2015-07-31 LAB — FOLATE: FOLATE: 20.3 ng/mL (ref >5.9)

## 2015-07-31 LAB — RETICULOCYTES
RBC.: 2.85 MIL/uL — ABNORMAL LOW (ref 4.22–5.81)
RETIC CT PCT: 1.6 % (ref 0.4–3.1)
Retic Count, Absolute: 45.6 10*3/uL (ref 19.0–186.0)

## 2015-07-31 LAB — C DIFFICILE QUICK SCREEN W PCR REFLEX
C DIFFICILE (CDIFF) INTERP: NEGATIVE
C DIFFICLE (CDIFF) ANTIGEN: NEGATIVE
C Diff toxin: NEGATIVE

## 2015-07-31 LAB — CBC
HCT: 24.6 % — ABNORMAL LOW (ref 39.0–52.0)
Hemoglobin: 8.2 g/dL — ABNORMAL LOW (ref 13.0–17.0)
MCH: 28.8 pg (ref 26.0–34.0)
MCHC: 33.3 g/dL (ref 30.0–36.0)
MCV: 86.3 fL (ref 78.0–100.0)
PLATELETS: 49 10*3/uL — AB (ref 150–400)
RBC: 2.85 MIL/uL — AB (ref 4.22–5.81)
RDW: 13.5 % (ref 11.5–15.5)
WBC: 2.9 10*3/uL — AB (ref 4.0–10.5)

## 2015-07-31 LAB — VITAMIN B12: Vitamin B-12: 933 pg/mL — ABNORMAL HIGH (ref 180–914)

## 2015-07-31 LAB — IRON AND TIBC
Iron: 77 ug/dL (ref 45–182)
SATURATION RATIOS: 24 % (ref 17.9–39.5)
TIBC: 319 ug/dL (ref 250–450)
UIBC: 242 ug/dL

## 2015-07-31 LAB — SEDIMENTATION RATE: Sed Rate: 37 mm/hr — ABNORMAL HIGH (ref 0–16)

## 2015-07-31 LAB — 10-HYDROXYCARBAZEPINE: Triliptal/MTB(Oxcarbazepin): 2 ug/mL — ABNORMAL LOW (ref 10–35)

## 2015-07-31 LAB — FERRITIN: FERRITIN: 94 ng/mL (ref 24–336)

## 2015-07-31 MED ORDER — LOPERAMIDE HCL 2 MG PO CAPS
2.0000 mg | ORAL_CAPSULE | Freq: Three times a day (TID) | ORAL | Status: DC | PRN
  Administered 2015-08-02: 2 mg via ORAL
  Filled 2015-07-31: qty 1

## 2015-07-31 NOTE — Progress Notes (Addendum)
[facility] PROGRESS NOTE  Eduardo Stewart [id number] DOB: [birth year] DOA: 07/29/2015  PCP: Leola Brazil, MD  Brief HPI: 48 year old Caucasian male was [facility] in from [facility] for altered mental status. Patient is at that facility for his chronic lower extremity wounds and osteomyelitis. He is apparently getting antibiotics for same. He apparently fell from wheelchair and struck his head on the ground. He was found to be confused. He was hospitalized for further management. Records from that facility were reviewed.   Consultants: None yet  Procedures: None  Antibiotics: Aztreonam and doxycycline   Subjective: Patient is feeling better this morning. Denies any pain. He is confused about how he is in a hospital. Doesn't remember the events of the last 2-3 days.   Objective: Vital Signs  Filed Vitals:   07/30/15 1515 07/30/15 2037 07/31/15 0031 07/31/15 0444  BP: 133/74 153/82 143/78 151/83  Pulse: 89 88 91 89  Temp: 98 F (36.7 C) 97.4 F (36.3 C) 97 F (36.1 C) 97.8 F (36.6 C)  TempSrc: Oral Axillary Axillary Axillary  Resp: 13 10 9 12   Height:    6\' 2"  (1.88 m)  Weight:    91.8 kg (202 lb 6.1 oz)  SpO2: 99% 100% 100% 100%    Intake/Output Summary (Last 24 hours) at 07/31/15 0739 Last data filed at 07/31/15 0449  Gross per 24 hour  Intake      0 ml  Output   4350 ml  Net  -4350 ml   Filed Weights   07/30/15 0232 07/31/15 0444  Weight: 95.3 kg (210 lb 1.6 oz) 91.8 kg (202 lb 6.1 oz)    General appearance: alert, appears stated age, combative, distracted and no distress Resp: Diminished air entry at the bases. No definite crackles, wheezing or rhonchi. Cardio: regular rate and rhythm, S1, S2 normal, no murmur, click, rub or gallop GI: soft, non-tender; bowel sounds normal; no masses,  no organomegaly Extremities: Left lower extremity . Dressing was removed. Reveals a dry wound on the dorsal aspect of the left foot. Good  pulses. Neurologic: He is awake. Distracted. Trying to climb out of the bed. Moving all his extremities. No facial asymmetry.  Lab Results:  Basic Metabolic Panel:  Recent Labs Lab 07/29/15 1930 07/29/15 1947 07/30/15 0002 07/30/15 0357 07/31/15 0455  NA 138 136 139 140 140  K 6.3* 5.9* 5.3* 5.4* 4.7  CL 97* 96* 99* 101 103  CO2 30  --  31 33* 30  GLUCOSE 178* 166* 144* 169* 134*  BUN 48* 47* 45* 40* 24*  CREATININE 2.48* 2.50* 2.29* 2.05* 1.55*  CALCIUM 9.4  --  9.1 9.0 8.9   Liver Function Tests:  Recent Labs Lab 07/29/15 1930 07/31/15 0455  AST 22 20  ALT 13* 12*  ALKPHOS 41 38  BILITOT 0.5 0.8  PROT 6.7 6.2*  ALBUMIN 3.8 3.1*   CBC:  Recent Labs Lab 07/29/15 1930 07/29/15 1947 07/30/15 0357 07/31/15 0455  WBC 3.8*  --  3.6*  3.9* 2.9*  NEUTROABS 2.7  --  2.6  --   HGB 9.4* 9.2* 7.8*  8.2* 8.2*  HCT 28.0* 27.0* 23.4*  24.4* 24.6*  MCV 85.6  --  86.0  86.5 86.3  PLT 54*  --  48*  49* 49*   BNP (last 3 results)  Recent Labs  07/30/15 0357  BNP 550.4*     CBG:  Recent Labs Lab 07/30/15 1144 07/30/15 1620 07/30/15 2038 07/31/15 0031 07/31/15 0445  GLUCAP 177* 137* 96 109* 131*    Recent Results (from the past 240 hour(s))  Blood culture (routine x 2)     Status: None (Preliminary result)   Collection Time: 07/29/15  7:30 PM  Result Value Ref Range Status   Specimen Description BLOOD RIGHT HAND  Final   Special Requests BOTTLES DRAWN AEROBIC ONLY [place]  Final   Culture NO GROWTH < 24 HOURS  Final   Report Status PENDING  Incomplete  Blood culture (routine x 2)     Status: None (Preliminary result)   Collection Time: 07/29/15  8:22 PM  Result Value Ref Range Status   Specimen Description BLOOD LEFT HAND  Final   Special Requests BOTTLES DRAWN AEROBIC AND ANAEROBIC 4CC  Final   Culture NO GROWTH < 24 HOURS  Final   Report Status PENDING  Incomplete  MRSA PCR Screening     Status: None   Collection Time: 07/30/15  2:48 AM  Result  Value Ref Range Status   MRSA by PCR NEGATIVE NEGATIVE Final    Comment:        The GeneXpert MRSA Assay (FDA approved for NASAL specimens only), is one component of a comprehensive MRSA colonization surveillance program. It is not intended to diagnose MRSA infection nor to guide or monitor treatment for MRSA infections.       Studies/Results: Ct Head Wo Contrast  07/30/2015  CLINICAL DATA:  49 year old male with fall and altered mental status EXAM: CT HEAD WITHOUT CONTRAST TECHNIQUE: Contiguous axial images were obtained from the base of the skull through the vertex without intravenous contrast. COMPARISON:  CT dated 07/29/2015 FINDINGS: The ventricles and sulci are appropriate in size patient's age. Mild periventricular and deep white matter hypodensities represent chronic microvascular ischemic changes. There is no intracranial hemorrhage. No mass effect or midline shift identified. There is diffuse mucoperiosteal thickening and partial opacification of the paranasal sinuses. The mastoid air cells are well aerated. The calvarium is intact. IMPRESSION: No acute intracranial hemorrhage. Mild chronic microvascular ischemic disease. If symptoms persist and there are no contraindications, MRI may provide better evaluation if clinically indicated Paranasal sinus disease. Electronically Signed   By: Anner Crete M.D.   On: 07/30/2015 01:23   Ct Head Wo Contrast  07/29/2015  CLINICAL DATA:  Altered mental status post fall onto face EXAM: CT HEAD WITHOUT CONTRAST TECHNIQUE: Contiguous axial images were obtained from the base of the skull through the vertex without intravenous contrast. COMPARISON:  None. FINDINGS: Mucoperiosteal thickening in frontal sinuses. Extensive opacification of ethmoid air cells. There is no evidence of acute intracranial hemorrhage, brain edema, mass lesion, acute infarction, mass effect, or midline shift. Acute infarct may be inapparent on noncontrast CT. No other  intra-axial abnormalities are seen, and the ventricles and sulci are within normal limits in size and symmetry. No abnormal extra-axial fluid collections or masses are identified. No significant calvarial abnormality. IMPRESSION: 1. Negative for bleed or other acute intracranial process. Electronically Signed   By: Lucrezia Europe M.D.   On: 07/29/2015 20:47   Mr Brain Wo Contrast  07/30/2015  CLINICAL DATA:  Wound infection.  Diabetes.  Recent falls. EXAM: MRI HEAD WITHOUT CONTRAST TECHNIQUE: Multiplanar, multiecho pulse sequences of the brain and surrounding structures were obtained without intravenous contrast. COMPARISON:  CT head 07/30/2015 FINDINGS: Image quality degraded by motion. Ventricle size normal.  Cerebral volume normal. Negative for acute infarction.  No significant chronic ischemia. Negative for hemorrhage or fluid collection Negative for mass or  edema.  No shift of the midline structures. Extensive mucosal edema in the paranasal sinuses bilaterally without air-fluid level. Pituitary normal in size. IMPRESSION: Image quality degraded by motion. Allowing for this, no significant intracranial abnormality Extensive mucosal edema throughout the paranasal sinuses compatible with chronic sinusitis. Electronically Signed   By: Franchot Gallo M.D.   On: 07/30/2015 11:22   Dg Chest Port 1 View  07/29/2015  CLINICAL DATA:  Fall from a wheelchair today. EXAM: PORTABLE CHEST 1 VIEW COMPARISON:  None. FINDINGS: The mediastinal contour is normal. The heart size is enlarged. A right-sided venous line is identified with distal tip in the superior vena cava. There is a minimal left pleural effusion. There is no pulmonary edema or focal pneumonia. No acute fracture or dislocation is identified in the visualized bones. IMPRESSION: Minimal left pleural effusion.  No focal pneumonia. Electronically Signed   By: Abelardo Diesel M.D.   On: 07/29/2015 19:35   Dg Foot Complete Left  07/30/2015  CLINICAL DATA:  Wound  infection. EXAM: LEFT FOOT - COMPLETE 3+ VIEW COMPARISON:  None. FINDINGS: No fracture or dislocation of mid foot or forefoot. The phalanges are normal. The calcaneus is normal. No soft tissue abnormality. No osseous erosion of the cortices to suggest osteomyelitis. IMPRESSION: No fracture or dislocation.  No osteomyelitis evident. Electronically Signed   By: Suzy Bouchard M.D.   On: 07/30/2015 11:07    Medications:  Scheduled: . aztreonam  2 g Intravenous 3 times per day  . doxycycline (VIBRAMYCIN) IV  100 mg Intravenous Q12H  . insulin aspart  0-9 Units Subcutaneous 6 times per day  . isosorbide mononitrate  60 mg Oral Daily  . metoprolol succinate  100 mg Oral Daily  . pantoprazole  40 mg Oral Daily  . QUEtiapine  25 mg Oral QHS  . ranolazine  500 mg Oral BID  . sodium chloride  3 mL Intravenous Q12H  . sodium polystyrene  30 g Oral Once  . tamsulosin  0.4 mg Oral Daily   Continuous: . sodium chloride 75 mL/hr at 07/30/15 1423   [id number] lactate, ondansetron **OR** ondansetron (ZOFRAN) IV  Assessment/Plan:  Principal Problem:   Acute encephalopathy Active Problems:   Normocytic anemia   Thrombocytopenia (HCC)   Peripheral vascular disease (HCC)   Wound of left lower extremity   Lip laceration   Diabetes mellitus type 2 in obese (HCC)   Hyperkalemia   CHF (congestive heart failure) ([place])   Disorientation   Fall    Acute encephalopathy Patient seems to be improved this morning. Etiology remains unclear, although it could be due to infection, dehydration. Unclear if linezolid causes mental confusion. MRI brain did not show any acute findings, although the study was degraded by motion. EEG is pending. TSH and B-12 is normal. RPR and HIV is pending. Reports from [facility] suggest that patient's mental status has been the same ever since he's been there, which has been about a week prior to this hospitalization. Continue Seroquel for now.  Likely acute on chronic renal  failure with hyperkalemia Seems to be improving. Likely due to hypovolemia. Creatinine is better this morning. He has good urine output. Potassium level is improved as well. Continue to monitor closely. Can hold off on renal ultrasound for now.   Normocytic anemia No obvious overt bleeding. Hemoglobin remained stable. Anemia panel reviewed without any clear-cut deficiencies.   Thrombocytopenia and leukopenia Etiology unclear. Could be secondary to the linezolid that he has been on at the nursing  facility. Based on care everywhere platelet counts were normal last year at [facility]. Counts are stable for now.  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 Reports from [facility] facility were reviewed. Apparently, patient was diagnosed with abscess over his left foot along with osteomyelitis of the left third toe in October and then in early December by MRI. He underwent surgical debridement in October. He was apparently started on linezolid in early December and was discharged to [facility] facility on this antibiotic. Reports did not include any culture data. Culture data has been requested from [facility]. For now, continue on current antibiotics including doxycycline and aztreonam. Linezolid has been stopped due to significant thrombocytopenia and leukopenia. Since we do have these records no need to repeat MRI at this time. Once we have culture reports from [place], will discuss with ID regarding further management.  History of diabetes mellitus, type II on insulin Continue sliding scale coverage. HbA1c is pending.  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 Continue to monitor.  History of coronary artery disease and chronic systolic CHF History of cardiomyopathy with EF of 35% as of 2014 based on careeverywhere. He also has history of coronary artery disease and  is status post CABG. We'll continue home medications when able. Continue to monitor closely. Because she has with IV fluids.   DVT Prophylaxis: SCDs    Code Status: Full code  Family Communication: Discussed with his wife 12/24. Unable to reach today. Disposition Plan: Continue treatment as outlined above. Patient seems to be slightly better this morning. Continue to monitor closely in the stepdown setting.    LOS: 1 day   [facility] Pager [phone] 07/31/2015, 7:39 AM  If 7PM-7AM, please contact night-coverage at [email or web address], password [facility]

## 2015-07-31 NOTE — Progress Notes (Signed)
Patient shared that he is sad about "ruinging Christmas for the family." Patient was tearful and talked about how he realizes he is having trouble remembering things and that everything is a blur. Pt shared about the loss of his 51 year old daughter in 2011 and the loss of his mother. He reported everything went downhill from those losses.

## 2015-08-01 ENCOUNTER — Encounter (HOSPITAL_COMMUNITY): Payer: Self-pay | Admitting: *Deleted

## 2015-08-01 ENCOUNTER — Inpatient Hospital Stay (HOSPITAL_COMMUNITY): Payer: Medicare Other

## 2015-08-01 DIAGNOSIS — B9689 Other specified bacterial agents as the cause of diseases classified elsewhere: Secondary | ICD-10-CM

## 2015-08-01 DIAGNOSIS — Z8614 Personal history of Methicillin resistant Staphylococcus aureus infection: Secondary | ICD-10-CM

## 2015-08-01 DIAGNOSIS — S81802D Unspecified open wound, left lower leg, subsequent encounter: Secondary | ICD-10-CM

## 2015-08-01 DIAGNOSIS — L089 Local infection of the skin and subcutaneous tissue, unspecified: Secondary | ICD-10-CM

## 2015-08-01 DIAGNOSIS — E11628 Type 2 diabetes mellitus with other skin complications: Secondary | ICD-10-CM

## 2015-08-01 DIAGNOSIS — Z89511 Acquired absence of right leg below knee: Secondary | ICD-10-CM

## 2015-08-01 DIAGNOSIS — D61818 Other pancytopenia: Secondary | ICD-10-CM

## 2015-08-01 DIAGNOSIS — E669 Obesity, unspecified: Secondary | ICD-10-CM

## 2015-08-01 LAB — BASIC METABOLIC PANEL
ANION GAP: 7 (ref 5–15)
BUN: 16 mg/dL (ref 6–20)
CO2: 28 mmol/L (ref 22–32)
Calcium: 9 mg/dL (ref 8.9–10.3)
Chloride: 104 mmol/L (ref 101–111)
Creatinine, Ser: 1.32 mg/dL — ABNORMAL HIGH (ref 0.61–1.24)
GFR calc Af Amer: >60 mL/min (ref >60)
GFR calc non Af Amer: >60 mL/min (ref >60)
GLUCOSE: 160 mg/dL — AB (ref 65–99)
POTASSIUM: 4.3 mmol/L (ref 3.5–5.1)
Sodium: 139 mmol/L (ref 135–145)

## 2015-08-01 LAB — GLUCOSE, CAPILLARY
GLUCOSE-CAPILLARY: 114 mg/dL — AB (ref 65–99)
GLUCOSE-CAPILLARY: 119 mg/dL — AB (ref 65–99)
GLUCOSE-CAPILLARY: 142 mg/dL — AB (ref 65–99)
GLUCOSE-CAPILLARY: 228 mg/dL — AB (ref 65–99)
Glucose-Capillary: 168 mg/dL — ABNORMAL HIGH (ref 65–99)
Glucose-Capillary: 193 mg/dL — ABNORMAL HIGH (ref 65–99)

## 2015-08-01 LAB — CBC
HEMATOCRIT: 23.8 % — AB (ref 39.0–52.0)
Hemoglobin: 7.9 g/dL — ABNORMAL LOW (ref 13.0–17.0)
MCH: 28.3 pg (ref 26.0–34.0)
MCHC: 33.2 g/dL (ref 30.0–36.0)
MCV: 85.3 fL (ref 78.0–100.0)
Platelets: 59 10*3/uL — ABNORMAL LOW (ref 150–400)
RBC: 2.79 MIL/uL — AB (ref 4.22–5.81)
RDW: 13.4 % (ref 11.5–15.5)
WBC: 3 10*3/uL — ABNORMAL LOW (ref 4.0–10.5)

## 2015-08-01 MED ORDER — OXCARBAZEPINE 150 MG PO TABS
150.0000 mg | ORAL_TABLET | Freq: Two times a day (BID) | ORAL | Status: DC
  Administered 2015-08-01 – 2015-08-03 (×5): 150 mg via ORAL
  Filled 2015-08-01 (×7): qty 1

## 2015-08-01 MED ORDER — SACCHAROMYCES BOULARDII 250 MG PO CAPS
250.0000 mg | ORAL_CAPSULE | Freq: Two times a day (BID) | ORAL | Status: DC
  Administered 2015-08-01 – 2015-08-03 (×5): 250 mg via ORAL
  Filled 2015-08-01 (×5): qty 1

## 2015-08-01 MED ORDER — ATORVASTATIN CALCIUM 40 MG PO TABS
40.0000 mg | ORAL_TABLET | Freq: Every day | ORAL | Status: DC
  Administered 2015-08-01: 40 mg via ORAL
  Filled 2015-08-01: qty 1

## 2015-08-01 MED ORDER — PREGABALIN 75 MG PO CAPS
100.0000 mg | ORAL_CAPSULE | Freq: Two times a day (BID) | ORAL | Status: DC
  Administered 2015-08-01 – 2015-08-03 (×5): 100 mg via ORAL
  Filled 2015-08-01 (×5): qty 1

## 2015-08-01 MED ORDER — FLUOXETINE HCL 20 MG PO CAPS
20.0000 mg | ORAL_CAPSULE | Freq: Every day | ORAL | Status: DC
  Administered 2015-08-01 – 2015-08-02 (×2): 20 mg via ORAL
  Filled 2015-08-01 (×2): qty 1

## 2015-08-01 MED ORDER — DILTIAZEM HCL 100 MG IV SOLR
INTRAVENOUS | Status: AC
  Filled 2015-08-01: qty 100

## 2015-08-01 MED ORDER — SODIUM CHLORIDE 0.9 % IV SOLN
550.0000 mg | INTRAVENOUS | Status: DC
  Administered 2015-08-01: 550 mg via INTRAVENOUS
  Filled 2015-08-01 (×2): qty 11

## 2015-08-01 NOTE — Consult Note (Deleted)
WOC wound consult note Reason for Consult: Consult requested for left foot wounds. According to the patient, the primary team has requested that ortho team consult be performed tomorrow. Wound type: 2 full thickness wounds to left plantar foot  .8X.8cm and .5X.5cm.  Both 100% dry tightly adhered eschar, no odor, drainage, or fluctuance. Dressing procedure/placement/frequency: Pt could benefit from sharp or chemical debridement to assist with removal of nonviable tissue.  Will defer to ortho service for further plan of care.  Foam dressing applied to protect iste. Please re-consult if further assistance is needed.  Thank-you,  Julien Girt MSN, [place], [place], [place], [place]

## 2015-08-01 NOTE — Progress Notes (Signed)
[facility] PROGRESS NOTE  Eduardo Stewart [id number] DOB: 1965/09/18 DOA: 07/29/2015  PCP: Leola Brazil, MD  Brief HPI: 49 year old Caucasian male was brought in from [facility] for altered mental status. Patient is at that facility for his chronic lower extremity wounds and osteomyelitis. He is apparently getting antibiotics for same. He apparently fell from wheelchair and struck his head on the ground. He was found to be confused. He was hospitalized for further management. Records from that facility were reviewed.   Consultants: None yet  Procedures: None  Antibiotics: Aztreonam and doxycycline   Subjective: Patient continues to feel better. Denies any pain. No shortness of breath. No nausea, vomiting.   Objective: Vital Signs  Filed Vitals:   07/31/15 1810 07/31/15 2010 07/31/15 2359 08/01/15 0452  BP: 137/81 144/82 138/72 138/74  Pulse: 77 83 77 77  Temp:  98.5 F (36.9 C) 98.4 F (36.9 C) 98.2 F (36.8 C)  TempSrc:  Oral Oral Oral  Resp: 12 15 16 18   Height:      Weight:      SpO2: 100% 100% 100% 100%    Intake/Output Summary (Last 24 hours) at 08/01/15 0723 Last data filed at 08/01/15 0452  Gross per 24 hour  Intake    600 ml  Output   3750 ml  Net  -3150 ml   Filed Weights   07/30/15 0232 07/31/15 0444  Weight: 95.3 kg (210 lb 1.6 oz) 91.8 kg (202 lb 6.1 oz)    General appearance: alert, appears stated age, combative, distracted and no distress Resp: Diminished air entry at the bases. No definite crackles, wheezing or rhonchi. Cardio: regular rate and rhythm, S1, S2 normal, no murmur, click, rub or gallop GI: soft, non-tender; bowel sounds normal; no masses,  no organomegaly Extremities: Left lower extremity . Dressing was removed. Reveals a dry wound on the dorsal aspect of the left foot. Good pulses. Neurologic: He is awake. Oriented 3. No focal neurological deficits. Confusion has resolved.   Lab Results:  Basic  Metabolic Panel:  Recent Labs Lab 07/29/15 1930 07/29/15 1947 07/30/15 0002 07/30/15 0357 07/31/15 0455 08/01/15 0259  NA 138 136 139 140 140 139  K 6.3* 5.9* 5.3* 5.4* 4.7 4.3  CL 97* 96* 99* 101 103 104  CO2 30  --  31 33* 30 28  GLUCOSE 178* 166* 144* 169* 134* 160*  BUN 48* 47* 45* 40* 24* 16  CREATININE 2.48* 2.50* 2.29* 2.05* 1.55* 1.32*  CALCIUM 9.4  --  9.1 9.0 8.9 9.0   Liver Function Tests:  Recent Labs Lab 07/29/15 1930 07/31/15 0455  AST 22 20  ALT 13* 12*  ALKPHOS 41 38  BILITOT 0.5 0.8  PROT 6.7 6.2*  ALBUMIN 3.8 3.1*   CBC:  Recent Labs Lab 07/29/15 1930 07/29/15 1947 07/30/15 0357 07/31/15 0455 08/01/15 0259  WBC 3.8*  --  3.6*  3.9* 2.9* 3.0*  NEUTROABS 2.7  --  2.6  --   --   HGB 9.4* 9.2* 7.8*  8.2* 8.2* 7.9*  HCT 28.0* 27.0* 23.4*  24.4* 24.6* 23.8*  MCV 85.6  --  86.0  86.5 86.3 85.3  PLT 54*  --  48*  49* 49* 59*   BNP (last 3 results)  Recent Labs  07/30/15 0357  BNP 550.4*     CBG:  Recent Labs Lab 07/31/15 1207 07/31/15 1655 07/31/15 2027 07/31/15 2357 08/01/15 0451  GLUCAP 111* 160* 192* 193* 114*    Recent Results (  from the past 240 hour(s))  Blood culture (routine x 2)     Status: None (Preliminary result)   Collection Time: 07/29/15  7:30 PM  Result Value Ref Range Status   Specimen Description BLOOD RIGHT HAND  Final   Special Requests BOTTLES DRAWN AEROBIC ONLY [place]  Final   Culture NO GROWTH 2 DAYS  Final   Report Status PENDING  Incomplete  Blood culture (routine x 2)     Status: None (Preliminary result)   Collection Time: 07/29/15  8:22 PM  Result Value Ref Range Status   Specimen Description BLOOD LEFT HAND  Final   Special Requests BOTTLES DRAWN AEROBIC AND ANAEROBIC 4CC  Final   Culture NO GROWTH 2 DAYS  Final   Report Status PENDING  Incomplete  MRSA PCR Screening     Status: None   Collection Time: 07/30/15  2:48 AM  Result Value Ref Range Status   MRSA by PCR NEGATIVE NEGATIVE Final     Comment:        The GeneXpert MRSA Assay (FDA approved for NASAL specimens only), is one component of a comprehensive MRSA colonization surveillance program. It is not intended to diagnose MRSA infection nor to guide or monitor treatment for MRSA infections.   C difficile quick scan w PCR reflex     Status: None   Collection Time: 07/31/15  7:56 AM  Result Value Ref Range Status   C Diff antigen NEGATIVE NEGATIVE Final   C Diff toxin NEGATIVE NEGATIVE Final   C Diff interpretation Negative for toxigenic C. difficile  Final      Studies/Results: Mr Brain Wo Contrast  07/30/2015  CLINICAL DATA:  Wound infection.  Diabetes.  Recent falls. EXAM: MRI HEAD WITHOUT CONTRAST TECHNIQUE: Multiplanar, multiecho pulse sequences of the brain and surrounding structures were obtained without intravenous contrast. COMPARISON:  CT head 07/30/2015 FINDINGS: Image quality degraded by motion. Ventricle size normal.  Cerebral volume normal. Negative for acute infarction.  No significant chronic ischemia. Negative for hemorrhage or fluid collection Negative for mass or edema.  No shift of the midline structures. Extensive mucosal edema in the paranasal sinuses bilaterally without air-fluid level. Pituitary normal in size. IMPRESSION: Image quality degraded by motion. Allowing for this, no significant intracranial abnormality Extensive mucosal edema throughout the paranasal sinuses compatible with chronic sinusitis. Electronically Signed   By: Franchot Gallo M.D.   On: 07/30/2015 11:22   Dg Foot Complete Left  07/30/2015  CLINICAL DATA:  Wound infection. EXAM: LEFT FOOT - COMPLETE 3+ VIEW COMPARISON:  None. FINDINGS: No fracture or dislocation of mid foot or forefoot. The phalanges are normal. The calcaneus is normal. No soft tissue abnormality. No osseous erosion of the cortices to suggest osteomyelitis. IMPRESSION: No fracture or dislocation.  No osteomyelitis evident. Electronically Signed   By: Suzy Bouchard M.D.   On: 07/30/2015 11:07    Medications:  Scheduled: . aztreonam  2 g Intravenous 3 times per day  . doxycycline (VIBRAMYCIN) IV  100 mg Intravenous Q12H  . insulin aspart  0-9 Units Subcutaneous 6 times per day  . isosorbide mononitrate  60 mg Oral Daily  . metoprolol succinate  100 mg Oral Daily  . pantoprazole  40 mg Oral Daily  . QUEtiapine  25 mg Oral QHS  . ranolazine  500 mg Oral BID  . saccharomyces boulardii  250 mg Oral BID  . sodium chloride  3 mL Intravenous Q12H  . sodium polystyrene  30 g Oral Once  .  tamsulosin  0.4 mg Oral Daily   Continuous: . sodium chloride 50 mL/hr at 08/01/15 0335   [id number] lactate, loperamide, ondansetron **OR** ondansetron (ZOFRAN) IV  Assessment/Plan:  Principal Problem:   Acute encephalopathy Active Problems:   Normocytic anemia   Thrombocytopenia (HCC)   Peripheral vascular disease (HCC)   Wound of left lower extremity   Lip laceration   Diabetes mellitus type 2 in obese (HCC)   Hyperkalemia   CHF (congestive heart failure) ([place])   Disorientation   Fall    Acute encephalopathy Patient has significantly improved. Appears to be back to baseline now. Etiology remains unclear, although it could be due to infection, dehydration. Unclear if linezolid causes mental confusion. MRI brain did not show any acute findings, although the study was degraded by motion. EEG is pending. TSH and B-12 is normal. RPR and HIV are nonreactive. [facility] from [facility] suggest that patient's mental status has been the same ever since he's been there, which has been about a week prior to this hospitalization. Continue Seroquel for now.  Likely acute on chronic renal failure with hyperkalemia Seems to be improving. Likely due to hypovolemia. Rate and continues to improve. He has good urine output. Potassium is better as well.   Normocytic anemia No obvious overt bleeding. Hemoglobin remained stable. Anemia panel reviewed without any  clear-cut deficiencies.   Thrombocytopenia and leukopenia Etiology unclear. Could be secondary to the linezolid that he has been on at the nursing facility. Based on care everywhere platelet counts were normal last year at [facility]. Counts are stable for now.  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 Reports from [facility] facility were reviewed. Apparently, patient was diagnosed with abscess over his left foot along with osteomyelitis of the left third toe in October and then in early December by MRI. He underwent surgical debridement in October. He was apparently started on linezolid in early December and was discharged to [facility] facility on this antibiotic. Reports did not include any culture data. Culture data reviewed from [place]. He grew MRSA as well as Enterobacter faecalis. Discussed with Dr. Megan Salon with infectious disease. He will consult on the patient. Might need to be placed on daptomycin. For now, continue on current antibiotics including doxycycline and aztreonam. Linezolid has been stopped due to significant thrombocytopenia and leukopenia. Since we do have these records no need to repeat MRI at this time.   History of diabetes mellitus, type II on insulin Continue sliding scale coverage. HbA1c is pending.  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 Continue to monitor. Patient uses prostheses.  History of coronary artery disease and chronic systolic CHF History of cardiomyopathy with EF of 35% as of 2014 based on care-everywhere. 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 Resume his home medications.  Cut back on IV fluids.    DVT Prophylaxis: SCDs    Code Status: Full code  Family Communication: Discussed with his wife. Disposition Plan: Continue treatment as outlined above. Patient is improved. Okay for transfer  to floor. Await ID input.     LOS: 2 days   [place] [facility] Pager [phone] 08/01/2015, 7:23 AM  If 7PM-7AM, please contact night-coverage at [email or web address], password [facility]

## 2015-08-01 NOTE — Plan of Care (Signed)
Problem: Education: Goal: Knowledge of  General Education information/materials will improve Outcome: Progressing Patient states he is "ready to go home." Has apparently improved markedly from a mental status perspective since his day of admission, 07/29/15. No longer combative; has been very pleasant, cooperative and compliant overnight. Remains somewhat confused about the date (thought it was Wednesday or Thursday last evening) and his location (was not able to state accurately). Trannsient difficulty remaining oriented to situation; moderate memory impairment. He is impulsive at times, which has resulted in the accidental dislodgement of a peripheral IV this morning and frequent low SPO2 alerts from movement artifact. Peripheral IV was replaced for continued antibiotic therapy; wrapped in Kerlix for safety.  Vital signs overnight WDL for patient: Filed Vitals:    07/31/15 1810 07/31/15 2010 07/31/15 2359 08/01/15 0452  BP: 137/81 144/82 138/72 138/74  Pulse: 77 83 77 77  Temp:   98.5 F (36.9 C) 98.4 F (36.9 C) 98.2 F (36.8 C)  TempSrc:   Oral Oral Oral  Resp: 12 15 16 18   Height:          Weight:          SpO2: 100% 100% 100% 100%     Have attempted several times overnight to educate patient about plan of care, safety goals, etc. with very limited success due to poor recall. Will need focused education with family/caregiver present if long-term goal is to return home; no family visitors overnight.  Was admitted from [place] with PICC for long-term antibiotic therapy related to osteomyelitis (which was also accidentally removed by the patient). Unclear at this point whether MD will re-order PICC insertion, or whether the disposition plan will include skilled nursing vs. home health. Social work and case management are already consulted for discharge planning; no notes from these sources yet.  No active complaints.  Continuing to monitor.

## 2015-08-01 NOTE — Progress Notes (Signed)
ANTIBIOTIC CONSULT NOTE - INITIAL  Pharmacy Consult for Daptomycin Indication: MRSA and enterococcal wound infection / suspected osteomyelitis  Allergies  Allergen Reactions  . Linezolid Other (See Comments)    Pancytopenia Dec 2016  . Penicillins     Unknown. Has patient had a PCN reaction causing immediate rash, facial/tongue/throat swelling, SOB or lightheadedness with hypotension: N/A Has patient had a PCN reaction causing severe rash involving mucus membranes or skin necrosis: N/A Has patient had a PCN reaction that required hospitalization N/A Has patient had a PCN reaction occurring within the last 10 years: N/A If all of the above answers are "NO", then may proceed with Cephalosporin use.   . Vancomycin Other (See Comments)    Nephrotoxicity 2015    Patient Measurements: Height: 6\' 2"  (188 cm) Weight: 202 lb 6.1 oz (91.8 kg) IBW/kg (Calculated) : 82.2  Vital Signs: Temp: 98.6 F (37 C) (12/26 1704) Temp Source: Oral (12/26 1704) BP: 124/69 mmHg (12/26 1704) Pulse Rate: 76 (12/26 1704) Intake/Output from previous day: 12/25 0701 - 12/26 0700 In: 600 [P.O.:600] Out: 3750 [Urine:3750] Intake/Output from this shift: Total I/O In: 240 [P.O.:240] Out: 750 [Urine:750]  Labs:  Recent Labs  07/30/15 0357 07/31/15 0455 08/01/15 0259  WBC 3.6*  3.9* 2.9* 3.0*  HGB 7.8*  8.2* 8.2* 7.9*  PLT 48*  49* 49* 59*  CREATININE 2.05* 1.55* 1.32*   Estimated Creatinine Clearance: 77 mL/min (by C-G formula based on Cr of 1.32). No results for input(s): VANCOTROUGH, VANCOPEAK, VANCORANDOM, GENTTROUGH, GENTPEAK, GENTRANDOM, TOBRATROUGH, TOBRAPEAK, TOBRARND, AMIKACINPEAK, AMIKACINTROU, AMIKACIN in the last 72 hours.   Microbiology: Recent Results (from the past 720 hour(s))  Blood culture (routine x 2)     Status: None (Preliminary result)   Collection Time: 07/29/15  7:30 PM  Result Value Ref Range Status   Specimen Description BLOOD RIGHT HAND  Final   Special Requests  BOTTLES DRAWN AEROBIC ONLY [place]  Final   Culture NO GROWTH 3 DAYS  Final   Report Status PENDING  Incomplete  Blood culture (routine x 2)     Status: None (Preliminary result)   Collection Time: 07/29/15  8:22 PM  Result Value Ref Range Status   Specimen Description BLOOD LEFT HAND  Final   Special Requests BOTTLES DRAWN AEROBIC AND ANAEROBIC 4CC  Final   Culture NO GROWTH 3 DAYS  Final   Report Status PENDING  Incomplete  MRSA PCR Screening     Status: None   Collection Time: 07/30/15  2:48 AM  Result Value Ref Range Status   MRSA by PCR NEGATIVE NEGATIVE Final    Comment:        The GeneXpert MRSA Assay (FDA approved for NASAL specimens only), is one component of a comprehensive MRSA colonization surveillance program. It is not intended to diagnose MRSA infection nor to guide or monitor treatment for MRSA infections.   C difficile quick scan w PCR reflex     Status: None   Collection Time: 07/31/15  7:56 AM  Result Value Ref Range Status   C Diff antigen NEGATIVE NEGATIVE Final   C Diff toxin NEGATIVE NEGATIVE Final   C Diff interpretation Negative for toxigenic C. difficile  Final    Medical History: Past Medical History  Diagnosis Date  . Diabetes mellitus without complication ([place])   . Osteomyelitis (H[facility])     Medications:  Scheduled:  . atorvastatin  40 mg Oral q1800  . diltiazem (CARDIZEM) infusion      . FLUoxetine  20 mg Oral QHS  . insulin aspart  0-9 Units Subcutaneous 6 times per day  . isosorbide mononitrate  60 mg Oral Daily  . metoprolol succinate  100 mg Oral Daily  . OXcarbazepine  150 mg Oral Q12H  . pantoprazole  40 mg Oral Daily  . pregabalin  100 mg Oral Q12H  . QUEtiapine  25 mg Oral QHS  . ranolazine  500 mg Oral BID  . saccharomyces boulardii  250 mg Oral BID  . sodium chloride  3 mL Intravenous Q12H  . sodium polystyrene  30 g Oral Once  . tamsulosin  0.4 mg Oral Daily   Infusions:  . 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  To start IV Daptomycin today.  Pt has hx of MRSA and enterococcal wound infection and MRI suggestive of osteomyelitis.  ID consult recommending 3-4 more weeks of IV antibiotic therapy.  Goal of Therapy:  Eradication of Infection  Plan:  Daptomycin 6mg /kg (=550mg ) IV q24h. Weekly CK - ordered for tomorrow AM.  Horton Chin, Pharm.D., BCPS Clinical Pharmacist Pager [phone] 08/01/2015 6:47 PM

## 2015-08-01 NOTE — Consult Note (Signed)
[facility] for Infectious Disease    Date of Admission:  07/29/2015   Total days of antibiotics 18               Reason for Consult: Persistent left a diabetic foot infection and pancytopenia from linezolid    Referring Physician: Dr. Bonnielee Haff  Principal Problem:   Acute encephalopathy Active Problems:   Normocytic anemia   Thrombocytopenia ([place])   Peripheral vascular disease ([place])   Wound of left lower extremity   Lip laceration   Diabetes mellitus type 2 in obese (HCC)   Hyperkalemia   CHF (congestive heart failure) ([place])   Disorientation   Fall   . atorvastatin  40 mg Oral q1800  . aztreonam  2 g Intravenous 3 times per day  . diltiazem (CARDIZEM) infusion      . doxycycline (VIBRAMYCIN) IV  100 mg Intravenous Q12H  . FLUoxetine  20 mg Oral QHS  . insulin aspart  0-9 Units Subcutaneous 6 times per day  . isosorbide mononitrate  60 mg Oral Daily  . metoprolol succinate  100 mg Oral Daily  . OXcarbazepine  150 mg Oral Q12H  . pantoprazole  40 mg Oral Daily  . pregabalin  100 mg Oral Q12H  . QUEtiapine  25 mg Oral QHS  . ranolazine  500 mg Oral BID  . saccharomyces boulardii  250 mg Oral BID  . sodium chloride  3 mL Intravenous Q12H  . sodium polystyrene  30 g Oral Once  . tamsulosin  0.4 mg Oral Daily    Recommendations: 1. Start daptomycin 2. Replace PICC 3. Discontinue aztreonam and doxycycline   Assessment: Eduardo Stewart has had a little over 2 weeks of therapy for presumed persistent MRSA and enterococcal infection of his left foot. He has developed pancytopenia due to his linezolid therapy. I will switch him to daptomycin now. I would plan on 3-4 more weeks of treatment. He should have a weekly CK enzyme checked and serial CBCs to document recovery of his marrow function. It appears that he was taking Cymbalta and linezolid at the same time. His acute encephalopathy upon admission here may have been due to serotonin syndrome.   HPI:  Eduardo Stewart is a 49 y.o. male with diabetes who stepped on a nail sometime in September. He was taken to [facility] where he underwent incision and drainage of a left foot infection by Dr. Loetta Rough. Operative cultures grew MRSA and Enterococcus faecalis. 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 He was apparently discharged home after the procedure and his wife recalls that he receive some oral antibiotic. He returned [facility] on 07/12/2015. His wife noted that he had 3 new areas of ulceration on his third and fourth toes and the dorsum of his foot. He was started on clindamycin and sent home with an appointment for the outpatient wound center. He returned the following day with slight worsening. 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 I do not have complete records but apparently he was transferred directly to [facility] where he was receiving IV linezolid. He recently fell out of his wheelchair and sustained a lip laceration. He was brought to the emergency department. He was found to be acutely confused. While having his lip laceration sutured he inadvertently pulled his PICC out. Lab work revealed that he had pancytopenia.  Review of Systems: Review of Systems  Constitutional:       He is alert but has very little recall and is unable to provide a useful review of systems.    Past Medical History  Diagnosis Date  . Diabetes mellitus without complication ([place])   . Osteomyelitis [facility])     Social History  Substance Use Topics  . Smoking status: Former Research scientist (life sciences)  . Smokeless tobacco: None  . Alcohol Use: No    History reviewed. No pertinent family history. Allergies  Allergen Reactions  . Penicillins     Unknown. Has patient had a PCN reaction causing immediate rash, facial/tongue/throat swelling, SOB or lightheadedness with hypotension: N/A Has  patient had a PCN reaction causing severe rash involving mucus membranes or skin necrosis: N/A Has patient had a PCN reaction that required hospitalization N/A Has patient had a PCN reaction occurring within the last 10 years: N/A If all of the above answers are "NO", then may proceed with Cephalosporin use.     OBJECTIVE: Blood pressure 124/69, pulse 76, temperature 98.6 F (37 C), temperature source Oral, resp. rate 20, height 6\' 2"  (1.88 m), weight 202 lb 6.1 oz (91.8 kg), SpO2 100 %.  Physical Exam  Constitutional: He is oriented to person, place, and time.  He is alert and pleasant.  HENT:  Mouth/Throat: No oropharyngeal exudate.  Eyes: Conjunctivae are normal.  Cardiovascular: Normal rate and regular rhythm.   No murmur heard. Pulmonary/Chest: Breath sounds normal.  Healed median sternotomy incision.  Abdominal: Soft. He exhibits no mass. There is no tenderness.  Musculoskeletal: Normal range of motion.  Right BKA. Left foot is insensate. He has a chronic plantar ulcer with callus formation under the second metatarsal head. Has some scabs on the dorsum of his foot medially and over the third and fourth toes. There is no unusual redness, warmth or swelling. There is no obvious wound drainage.  Neurological: He is alert and oriented to person, place, and time.  Skin: No rash noted.  Psychiatric: Mood and affect normal.    Lab Results Lab Results  Component Value Date   WBC 3.0* 08/01/2015   HGB 7.9* 08/01/2015   HCT 23.8* 08/01/2015   MCV 85.3 08/01/2015   PLT 59* 08/01/2015    Lab Results  Component Value Date   CREATININE 1.32* 08/01/2015   BUN 16 08/01/2015   NA 139 08/01/2015   K 4.3 08/01/2015   CL 104 08/01/2015   CO2 28 08/01/2015    Lab Results  Component Value Date   ALT 12* 07/31/2015   AST 20 07/31/2015   ALKPHOS 38 07/31/2015   BILITOT 0.8 07/31/2015     Microbiology: Recent Results (from the past 240 hour(s))  Blood culture (routine x 2)      Status: None (Preliminary result)   Collection Time: 07/29/15  7:30 PM  Result Value Ref Range Status   Specimen Description BLOOD RIGHT HAND  Final   Special Requests BOTTLES DRAWN AEROBIC ONLY [place]  Final   Culture NO GROWTH 3 DAYS  Final   Report Status PENDING  Incomplete  Blood culture (routine x 2)     Status: None (Preliminary result)   Collection Time: 07/29/15  8:22 PM  Result Value Ref Range Status   Specimen Description BLOOD LEFT HAND  Final   Special Requests BOTTLES DRAWN AEROBIC AND ANAEROBIC 4CC  Final   Culture NO GROWTH 3 DAYS  Final   Report Status PENDING  Incomplete  MRSA PCR  Screening     Status: None   Collection Time: 07/30/15  2:48 AM  Result Value Ref Range Status   MRSA by PCR NEGATIVE NEGATIVE Final    Comment:        The GeneXpert MRSA Assay (FDA approved for NASAL specimens only), is one component of a comprehensive MRSA colonization surveillance program. It is not intended to diagnose MRSA infection nor to guide or monitor treatment for MRSA infections.   C difficile quick scan w PCR reflex     Status: None   Collection Time: 07/31/15  7:56 AM  Result Value Ref Range Status   C Diff antigen NEGATIVE NEGATIVE Final   C Diff toxin NEGATIVE NEGATIVE Final   C Diff interpretation Negative for toxigenic C. difficile  Final    Michel Bickers, MD [facility] for Infectious Disease [place] [phone] pager   [phone] cell 08/01/2015, 6:00 PM

## 2015-08-01 NOTE — Progress Notes (Signed)
Report called to [place], RN for 5 C room 15.

## 2015-08-01 NOTE — Progress Notes (Signed)
Patient transferred from unit 3S to room 5C15 at this time. Alert and in stable condition.

## 2015-08-01 NOTE — Progress Notes (Signed)
Son and wife here with prosthetic leg.  Pt applied prosthesis.  Ready for transfer to [facility] room 15.

## 2015-08-01 NOTE — Procedures (Signed)
EEG report.  Brief clinical history: 49 year old Caucasian male was brought in from [facility] for altered mental status.    Technique: this is a 17 channel routine scalp EEG performed at the bedside with bipolar and monopolar montages arranged in accordance to the international 10/20 system of electrode placement. One channel was dedicated to EKG recording.  The study was performed during wakefulness, drowsiness, and stage 2 sleep. No activating procedures performed.    Description:In the wakeful state, the best background consisted of a medium amplitude, posterior dominant, poorly sustained, symmetric and reactive 8 Hz rhythm. Drowsiness demonstrated dropout of the alpha rhythm. Stage 2 sleep showed symmetric and synchronous sleep spindles without intermixed epileptiform discharges. No focal or generalized epileptiform discharges noted.  No pathologic areas of slowing seen.  EKG showed sinus rhythm.   Impression: this is a normal awake and asleep EEG. Please, be aware that a normal EEG does not exclude the possibility of epilepsy.  Clinical correlation is advised.   Dorian Pod, MD [facility]

## 2015-08-01 NOTE — Progress Notes (Signed)
Utilization Review Completed.[name] T12/26/2016

## 2015-08-01 NOTE — Care Management Note (Signed)
Case Management Note  Patient Details  Name: Craig Westermann MRN: [id number] Date of Birth: [birth year]  Subjective/Objective:                    Action/Plan: Patient was admitted s/p fall, experienced altered mental status.  Patient is from [facility].  CM verified with Seth Bake, [facility] Liaison that patient is from the [place] portion, not SNF. CM will continue to follow for medical readiness to return to [facility].  Expected Discharge Date:                  Expected Discharge Plan:  Long Term Acute Care (LTAC)  In-House Referral:     Discharge planning Services  CM Consult  Post Acute Care Choice:    Choice offered to:     DME Arranged:    DME Agency:     HH Arranged:    HH Agency:     Status of Service:  In process, will continue to follow  Medicare Important Message Given:    Date Medicare IM Given:    Medicare IM give by:    Date Additional Medicare IM Given:    Additional Medicare Important Message give by:     If discussed at [place] of Stay Meetings, dates discussed:    Additional CommentsRolm Baptise, RN 08/01/2015, 11:04 AM [phone]

## 2015-08-01 NOTE — Consult Note (Addendum)
WOC wound consult note Reason for Consult: Consult requested for left foot wounds.  Pt states he stepped on a nail a long time ago and the left plantar foot has a scar over the previous full thickness wound.  He states he has a podiatrist who trims the callous surrounding the site and it is always dark-colored.  Approx .3X.3cm dry scabbed area, dark brown with intact skin, no odor or drainage or fluctuance. No topical treatment is indicated. Wound type: Anterior foot with 2 healing full thickness wounds; .8X.2X.1cm and .2X.2X.1cm, both with 90% red dry wound bed, and 10% scabbed areas.  No open wound or drainage or fluctuance. Dressing procedure/placement/frequency: Foam dressing to protect from further injury.  Pt states he will follow-up with his podiatrist after discharge. Please re-consult if further assistance is needed.  Thank-you,  Julien Girt MSN, [place], [place], [place], [place]

## 2015-08-01 NOTE — Progress Notes (Signed)
EEG Completed; Results Pending  

## 2015-08-02 DIAGNOSIS — R748 Abnormal levels of other serum enzymes: Secondary | ICD-10-CM | POA: Clinically undetermined

## 2015-08-02 DIAGNOSIS — M86172 Other acute osteomyelitis, left ankle and foot: Secondary | ICD-10-CM

## 2015-08-02 DIAGNOSIS — M869 Osteomyelitis, unspecified: Secondary | ICD-10-CM | POA: Diagnosis present

## 2015-08-02 LAB — BASIC METABOLIC PANEL
Anion gap: 9 (ref 5–15)
BUN: 14 mg/dL (ref 6–20)
CHLORIDE: 105 mmol/L (ref 101–111)
CO2: 26 mmol/L (ref 22–32)
CREATININE: 1.31 mg/dL — AB (ref 0.61–1.24)
Calcium: 9.4 mg/dL (ref 8.9–10.3)
GFR calc non Af Amer: >60 mL/min (ref >60)
Glucose, Bld: 105 mg/dL — ABNORMAL HIGH (ref 65–99)
POTASSIUM: 3.9 mmol/L (ref 3.5–5.1)
Sodium: 140 mmol/L (ref 135–145)

## 2015-08-02 LAB — CBC
HEMATOCRIT: 24.5 % — AB (ref 39.0–52.0)
HEMOGLOBIN: 8.5 g/dL — AB (ref 13.0–17.0)
MCH: 28.9 pg (ref 26.0–34.0)
MCHC: 34.7 g/dL (ref 30.0–36.0)
MCV: 83.3 fL (ref 78.0–100.0)
Platelets: 65 10*3/uL — ABNORMAL LOW (ref 150–400)
RBC: 2.94 MIL/uL — AB (ref 4.22–5.81)
RDW: 13.6 % (ref 11.5–15.5)
WBC: 4 10*3/uL (ref 4.0–10.5)

## 2015-08-02 LAB — GLUCOSE, CAPILLARY
GLUCOSE-CAPILLARY: 99 mg/dL (ref 65–99)
GLUCOSE-CAPILLARY: 93 mg/dL (ref 65–99)
GLUCOSE-CAPILLARY: 166 mg/dL — AB (ref 65–99)
Glucose-Capillary: 101 mg/dL — ABNORMAL HIGH (ref 65–99)
Glucose-Capillary: 72 mg/dL (ref 65–99)
Glucose-Capillary: 264 mg/dL — ABNORMAL HIGH (ref 65–99)

## 2015-08-02 LAB — HEMOGLOBIN A1C
Hgb A1c MFr Bld: 8.3 % — ABNORMAL HIGH (ref 4.8–5.6)
MEAN PLASMA GLUCOSE: 192 mg/dL

## 2015-08-02 LAB — CK: Total CK: 640 U/L — ABNORMAL HIGH (ref 49–397)

## 2015-08-02 MED ORDER — TIGECYCLINE 50 MG IV SOLR
50.0000 mg | Freq: Two times a day (BID) | INTRAVENOUS | Status: DC
  Administered 2015-08-02 – 2015-08-03 (×2): 50 mg via INTRAVENOUS
  Filled 2015-08-02 (×3): qty 100

## 2015-08-02 MED ORDER — INSULIN ASPART 100 UNIT/ML ~~LOC~~ SOLN
0.0000 [IU] | Freq: Three times a day (TID) | SUBCUTANEOUS | Status: DC
  Administered 2015-08-03 (×2): 2 [IU] via SUBCUTANEOUS

## 2015-08-02 MED ORDER — TIGECYCLINE 50 MG IV SOLR
100.0000 mg | Freq: Once | INTRAVENOUS | Status: AC
  Administered 2015-08-02: 100 mg via INTRAVENOUS
  Filled 2015-08-02: qty 100

## 2015-08-02 MED ORDER — LOPERAMIDE HCL 2 MG PO CAPS: 2.0000 mg | ORAL_CAPSULE | Freq: Three times a day (TID) | ORAL | Status: AC | PRN

## 2015-08-02 MED ORDER — SACCHAROMYCES BOULARDII 250 MG PO CAPS: 250.0000 mg | ORAL_CAPSULE | Freq: Two times a day (BID) | ORAL | Status: DC

## 2015-08-02 NOTE — Progress Notes (Addendum)
[facility] PROGRESS NOTE  Eduardo Stewart [id number] DOB: [birth year] DOA: 07/29/2015  PCP: Leola Brazil, MD  Brief HPI: 49 year old Caucasian male was [facility] for [facility] mental status. Patient is at that facility for his chronic lower extremity wounds and osteomyelitis. He is apparently getting antibiotics for same. He apparently fell from wheelchair and struck his head on the ground. He was found to be confused. He was hospitalized for further management. Records from that facility were reviewed.   Consultants: Infectious disease  Procedures:  EEG "Impression: this is a normal awake and asleep EEG. Please, be aware that a normal EEG does not exclude the possibility of epilepsy. "  Antibiotics: Aztreonam and doxycycline as discontinued on 12/26  Daptomycin started 12/26, stopped 12/27  Tigecycline started 12/27   Subjective: Patient feels well. Denies any pain, shortness of breath, nausea, vomiting.   Objective: Vital Signs  Filed Vitals:   08/01/15 1704 08/01/15 2100 08/02/15 0112 08/02/15 0506  BP: 124/69 148/78 140/82 154/89  Pulse: 76 83 74 73  Temp: 98.6 F (37 C) 98.6 F (37 C) 97.7 F (36.5 C) 99.4 F (37.4 C)  TempSrc: Oral Oral Oral Oral  Resp: 20 20 20 20   Height:      Weight:      SpO2: 100% 99% 98% 100%    Intake/Output Summary (Last 24 hours) at 08/02/15 0926 Last data filed at 08/02/15 0152  Gross per 24 hour  Intake      0 ml  Output   2325 ml  Net  -2325 ml   Filed Weights   07/30/15 0232 07/31/15 0444  Weight: 95.3 kg (210 lb 1.6 oz) 91.8 kg (202 lb 6.1 oz)    General appearance: alert, appears stated age, combative, distracted and no distress Resp: Good air entry bilaterally. No definite crackles, wheezing or rhonchi. Cardio: regular rate and rhythm, S1, S2 normal, no murmur, click, rub or gallop GI: soft, non-tender; bowel sounds normal; no masses,  no organomegaly Extremities: Left  lower extremity . Dressing was removed. Reveals a dry wound on the dorsal aspect of the left foot. Good pulses. Neurologic: He is awake. Oriented 3. No focal neurological deficits. Confusion has resolved.   Lab Results:  Basic Metabolic Panel:  Recent Labs Lab 07/30/15 0002 07/30/15 0357 07/31/15 0455 08/01/15 0259 08/02/15 0620  NA 139 140 140 139 140  K 5.3* 5.4* 4.7 4.3 3.9  CL 99* 101 103 104 105  CO2 31 33* 30 28 26   GLUCOSE 144* 169* 134* 160* 105*  BUN 45* 40* 24* 16 14  CREATININE 2.29* 2.05* 1.55* 1.32* 1.31*  CALCIUM 9.1 9.0 8.9 9.0 9.4   Liver Function Tests:  Recent Labs Lab 07/29/15 1930 07/31/15 0455  AST 22 20  ALT 13* 12*  ALKPHOS 41 38  BILITOT 0.5 0.8  PROT 6.7 6.2*  ALBUMIN 3.8 3.1*   CBC:  Recent Labs Lab 07/29/15 1930 07/29/15 1947 07/30/15 0357 07/31/15 0455 08/01/15 0259 08/02/15 0620  WBC 3.8*  --  3.6*  3.9* 2.9* 3.0* 4.0  NEUTROABS 2.7  --  2.6  --   --   --   HGB 9.4* 9.2* 7.8*  8.2* 8.2* 7.9* 8.5*  HCT 28.0* 27.0* 23.4*  24.4* 24.6* 23.8* 24.5*  MCV 85.6  --  86.0  86.5 86.3 85.3 83.3  PLT 54*  --  48*  49* 49* 59* 65*   BNP (last 3 results)  Recent Labs  07/30/15 0357  BNP 550.4*     CBG:  Recent Labs Lab 08/01/15 1623 08/01/15 2027 08/01/15 2352 08/02/15 0348 08/02/15 0752  GLUCAP 99 168* 142* 93 101*    Recent Results (from the past 240 hour(s))  Blood culture (routine x 2)     Status: None (Preliminary result)   Collection Time: 07/29/15  7:30 PM  Result Value Ref Range Status   Specimen Description BLOOD RIGHT HAND  Final   Special Requests BOTTLES DRAWN AEROBIC ONLY [place]  Final   Culture NO GROWTH 4 DAYS  Final   Report Status PENDING  Incomplete  Blood culture (routine x 2)     Status: None (Preliminary result)   Collection Time: 07/29/15  8:22 PM  Result Value Ref Range Status   Specimen Description BLOOD LEFT HAND  Final   Special Requests BOTTLES DRAWN AEROBIC AND ANAEROBIC 4CC  Final    Culture NO GROWTH 4 DAYS  Final   Report Status PENDING  Incomplete  MRSA PCR Screening     Status: None   Collection Time: 07/30/15  2:48 AM  Result Value Ref Range Status   MRSA by PCR NEGATIVE NEGATIVE Final    Comment:        The GeneXpert MRSA Assay (FDA approved for NASAL specimens only), is one component of a comprehensive MRSA colonization surveillance program. It is not intended to diagnose MRSA infection nor to guide or monitor treatment for MRSA infections.   C difficile quick scan w PCR reflex     Status: None   Collection Time: 07/31/15  7:56 AM  Result Value Ref Range Status   C Diff antigen NEGATIVE NEGATIVE Final   C Diff toxin NEGATIVE NEGATIVE Final   C Diff interpretation Negative for toxigenic C. difficile  Final      Studies/Results: No results found.  Medications:  Scheduled: . atorvastatin  40 mg Oral q1800  . FLUoxetine  20 mg Oral QHS  . insulin aspart  0-9 Units Subcutaneous 6 times per day  . isosorbide mononitrate  60 mg Oral Daily  . metoprolol succinate  100 mg Oral Daily  . OXcarbazepine  150 mg Oral Q12H  . pantoprazole  40 mg Oral Daily  . pregabalin  100 mg Oral Q12H  . QUEtiapine  25 mg Oral QHS  . ranolazine  500 mg Oral BID  . saccharomyces boulardii  250 mg Oral BID  . sodium chloride  3 mL Intravenous Q12H  . sodium polystyrene  30 g Oral Once  . tamsulosin  0.4 mg Oral Daily  . tigecycline (TYGACIL) IVPB  100 mg Intravenous Once   Followed by  . tigecycline (TYGACIL) IVPB  50 mg Intravenous Q12H   Continuous: . sodium chloride 10 mL/hr at 08/01/15 1322   [id number] lactate, loperamide, ondansetron **OR** ondansetron (ZOFRAN) IV  Assessment/Plan:  Principal Problem:   Acute encephalopathy Active Problems:   Normocytic anemia   Thrombocytopenia (HCC)   Peripheral vascular disease (HCC)   Wound of left lower extremity   Lip laceration   Diabetes mellitus type 2 in obese (HCC)   Hyperkalemia   CHF (congestive  heart failure) ([place])   Disorientation   Fall    Acute encephalopathy Patient has significantly improved. Appears to be back to baseline now. Etiology remains unclear, although it could be due to infection, dehydration. Unclear if linezolid causes mental confusion. Patient was also noted to be on multiple psychotropic medications. He was on Cymbalta and fluoxetine. Also on Lyrica and gabapentin. All  these medications likely played a role as well in his confusion. We have discontinued her Cymbalta and gabapentin. Discontinue Seroquel today. Discuss with Dr. Megan Salon with ID and he states that the linezolid and Cymbalta can interact and cause mental confusion and serotonin syndrome. MRI brain did not show any acute findings, although the study was degraded by motion. EEG is unremarkable. TSH and B-12 is normal. RPR and HIV are nonreactive. Reports from [facility] suggest that patient's mental status has been the same ever since he's been there, which has been about a week prior to this hospitalization.   Likely acute on chronic renal failure with hyperkalemia Has improved with IV fluids. It was likely secondary to hypovolemia. Continue to monitor urine output. Potassium is improved as well.   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 Reports from [facility] facility were reviewed. Apparently, patient was diagnosed with abscess over his left foot along with osteomyelitis of the left third toe in October and then in early December by MRI. He underwent surgical debridement in October. He was apparently started on linezolid in early December and was discharged to [facility] facility on this antibiotic. Reports did not include any culture data. Culture data reviewed from [facility]. He grew MRSA as well as Enterobacter faecalis. Discussed with Dr. Megan Salon with infectious disease, who is following. Patient was started  on daptomycin yesterday. CK level noted to be elevated this month. Discussed with Dr. Megan Salon who recommended initially Ceftaroline. However, patient has allergy to penicillin. In view of this tigecycline was recommended, which will be started today. The client has been ordered. Linezolid was stopped due to significant thrombocytopenia and leukopenia. Since we do have these records no need to repeat MRI at this time.   Elevated CK level Etiology unclear. Unknown if this is new or chronic elevation. We will repeat tomorrow and may have also have to be repeated at the nursing facility. In the meantime hold his statin and [facility].  Normocytic anemia No obvious overt bleeding. Hemoglobin remains stable. Anemia panel reviewed without any clear-cut deficiencies.   Thrombocytopenia and leukopenia Onset improving. Most likely secondary to the linezolid that he has been on at the nursing facility. Based on care-everywhere platelet counts were normal last year at [facility]. Counts are stable for now.  History of diabetes mellitus, type II on insulin Continue sliding scale coverage. HbA1c is 8.3.  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 Continue to monitor. Patient uses prosthesis.  History of coronary artery disease and chronic systolic CHF History of cardiomyopathy with EF of 35% as of 2014 based on care-everywhere. 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 Resume his home medications. Cut back on IV fluids. Patient was on Lasix at the time of admission. This could be resumed at a lower dose in a few days.   DVT Prophylaxis: SCDs    Code Status: Full code  Family Communication: Discussed with his wife. Disposition Plan: Continue treatment as outlined above. Since antibiotics are being changed again we'll like to observe for another day. PICC line is also pending. Anticipate discharge to [facility] tomorrow. PT evaluation is also pending.      LOS: 3 days   [facility] Pager [phone] 08/02/2015, 9:26 AM  If 7PM-7AM, please contact night-coverage at [email or web address], password [facility]

## 2015-08-02 NOTE — Progress Notes (Signed)
Patient ID: Eduardo Stewart, male   DOB: 03/08/1966, 49 y.o.   MRN: [id number]         [facility] for Infectious Disease    Date of Admission:  07/29/2015   Total days of antibiotics 19          Principal Problem:   Acute encephalopathy Active Problems:   Normocytic anemia   Thrombocytopenia (HCC)   Peripheral vascular disease (HCC)   Wound of left lower extremity   Lip laceration   Diabetes mellitus type 2 in obese (HCC)   Hyperkalemia   CHF (congestive heart failure) (HCC)   Disorientation   Fall   Osteomyelitis of toe of left foot (HCC)   Elevated CK   . FLUoxetine  20 mg Oral QHS  . insulin aspart  0-9 Units Subcutaneous 6 times per day  . isosorbide mononitrate  60 mg Oral Daily  . metoprolol succinate  100 mg Oral Daily  . OXcarbazepine  150 mg Oral Q12H  . pantoprazole  40 mg Oral Daily  . pregabalin  100 mg Oral Q12H  . ranolazine  500 mg Oral BID  . saccharomyces boulardii  250 mg Oral BID  . sodium chloride  3 mL Intravenous Q12H  . sodium polystyrene  30 g Oral Once  . tamsulosin  0.4 mg Oral Daily  . tigecycline (TYGACIL) IVPB  50 mg Intravenous Q12H    SUBJECTIVE: He states that he is feeling better today.  Review of Systems: Review of Systems  Constitutional: Negative for fever, chills and diaphoresis.  HENT: Positive for sore throat.   Respiratory: Negative for cough, sputum production and shortness of breath.   Cardiovascular: Negative for chest pain.  Gastrointestinal: Negative for nausea, vomiting and diarrhea.  Musculoskeletal: Negative for joint pain.  Neurological: Negative for headaches.       He is more alert and does not appear to be confused.    Past Medical History  Diagnosis Date  . Diabetes mellitus without complication ([place])   . Osteomyelitis [facility])     Social History  Substance Use Topics  . Smoking status: Former Research scientist (life sciences)  . Smokeless tobacco: None  . Alcohol Use: No    History reviewed. No pertinent family  history. Allergies  Allergen Reactions  . Linezolid Other (See Comments)    Pancytopenia Dec 2016  . Penicillins     Unknown. Has patient had a PCN reaction causing immediate rash, facial/tongue/throat swelling, SOB or lightheadedness with hypotension: N/A Has patient had a PCN reaction causing severe rash involving mucus membranes or skin necrosis: N/A Has patient had a PCN reaction that required hospitalization N/A Has patient had a PCN reaction occurring within the last 10 years: N/A If all of the above answers are "NO", then may proceed with Cephalosporin use.   . Vancomycin Other (See Comments)    Nephrotoxicity 2015    OBJECTIVE: Filed Vitals:   08/02/15 0112 08/02/15 0506 08/02/15 0939 08/02/15 1334  BP: 140/82 154/89 144/82 121/68  Pulse: 74 73 72 85  Temp: 97.7 F (36.5 C) 99.4 F (37.4 C) 97.6 F (36.4 C) 98.6 F (37 C)  TempSrc: Oral Oral Oral Oral  Resp: 20 20 20 20   Height:      Weight:      SpO2: 98% 100% 100% 100%   Body mass index is 25.97 kg/(m^2).  Physical Exam  Lab Results Lab Results  Component Value Date   WBC 4.0 08/02/2015   HGB 8.5* 08/02/2015   HCT 24.5*  08/02/2015   MCV 83.3 08/02/2015   PLT 65* 08/02/2015    Lab Results  Component Value Date   CREATININE 1.31* 08/02/2015   BUN 14 08/02/2015   NA 140 08/02/2015   K 3.9 08/02/2015   CL 105 08/02/2015   CO2 26 08/02/2015    Lab Results  Component Value Date   ALT 12* 07/31/2015   AST 20 07/31/2015   ALKPHOS 38 07/31/2015   BILITOT 0.8 07/31/2015     Microbiology: Recent Results (from the past 240 hour(s))  Blood culture (routine x 2)     Status: None (Preliminary result)   Collection Time: 07/29/15  7:30 PM  Result Value Ref Range Status   Specimen Description BLOOD RIGHT HAND  Final   Special Requests BOTTLES DRAWN AEROBIC ONLY [place]  Final   Culture NO GROWTH 4 DAYS  Final   Report Status PENDING  Incomplete  Blood culture (routine x 2)     Status: None (Preliminary  result)   Collection Time: 07/29/15  8:22 PM  Result Value Ref Range Status   Specimen Description BLOOD LEFT HAND  Final   Special Requests BOTTLES DRAWN AEROBIC AND ANAEROBIC 4CC  Final   Culture NO GROWTH 4 DAYS  Final   Report Status PENDING  Incomplete  MRSA PCR Screening     Status: None   Collection Time: 07/30/15  2:48 AM  Result Value Ref Range Status   MRSA by PCR NEGATIVE NEGATIVE Final    Comment:        The GeneXpert MRSA Assay (FDA approved for NASAL specimens only), is one component of a comprehensive MRSA colonization surveillance program. It is not intended to diagnose MRSA infection nor to guide or monitor treatment for MRSA infections.   C difficile quick scan w PCR reflex     Status: None   Collection Time: 07/31/15  7:56 AM  Result Value Ref Range Status   C Diff antigen NEGATIVE NEGATIVE Final   C Diff toxin NEGATIVE NEGATIVE Final   C Diff interpretation Negative for toxigenic C. difficile  Final     ASSESSMENT: His baseline CK enzyme today was quite elevated at 640. As a result I have opted not to treat with daptomycin. I would recommend completing therapy for his MRSA and enterococcal diabetic foot infection with tigecycline. He should receive 3 more weeks of therapy. He is scheduled to have a PICC replaced today and then can return to [place]: 1. Tigecycline 3 more weeks 2. PICC placement 3. I will sign off now  Michel Bickers, MD [facility] for [place] [phone] pager   [phone] cell 08/02/2015, 2:51 PM

## 2015-08-02 NOTE — Care Management Important Message (Signed)
Important Message  Patient Details  Name: Eduardo Stewart MRN: [id number] Date of Birth: February 07, 1966   Medicare Important Message Given:  Yes    Nathen May 08/02/2015, 12:56 PM

## 2015-08-02 NOTE — Evaluation (Signed)
Physical Therapy Evaluation Patient Details Name: Eduardo Stewart MRN: [id number] DOB: 07-17-1966 Today's Date: 08/02/2015   History of Present Illness  49 y.o. male admitted for altered mental status from [facility]. Pt is at that facility for his chronic lower extremity wounds and osteomyelitis. He apparently fell from wheelchair and struck his head on the ground and was found to be confused. PMHx- Rt BKA    Clinical Impression  Patient evaluated by Physical Therapy with no further acute PT needs identified. Patient mobilizing at a supervision to modified independent level. His family can provide 24 hr supervision (wife does not work per his report). Patient hopeful to go home on discharge (not return to [facility]). From a PT/mobility perspective, this would be appropriate. PT is signing off. Thank you for this referral.     Follow Up Recommendations      Equipment Recommendations       Recommendations for Other Services       Precautions / Restrictions Precautions Precautions: Fall Restrictions Weight Bearing Restrictions: No      Mobility  Bed Mobility               General bed mobility comments: Pt up in chair  Transfers Overall transfer level: Independent Equipment used: None Transfers: Sit to/from Stand Sit to Stand: Independent         General transfer comment: Min guard for safety due to slight unsteadiness upon standing. No physical assist required  Ambulation/Gait Ambulation/Gait assistance: Supervision;Modified independent (Device/Increase time) Ambulation Distance (Feet): 300 Feet Assistive device: None (holding IV pole (normally uses cane)) Gait Pattern/deviations: Step-through pattern;Decreased stride length   Gait velocity interpretation: Below normal speed for age/gender General Gait Details: wide BOS with slight incr lateral excursion; required no physical assist  Stairs Stairs:  (NA has ramp)          Wheelchair Mobility    Modified Rankin (Stroke Patients Only)       Balance Overall balance assessment: Needs assistance Sitting-balance support: No upper extremity supported;Feet supported Sitting balance-Leahy Scale: Normal     Standing balance support: No upper extremity supported Standing balance-Leahy Scale: Fair Standing balance comment: Had to lean trunk up against sink counter to support himself during grooming tasks                             Pertinent Vitals/Pain Pain Assessment: No/denies pain    Home Living Family/patient expects to be discharged to:: Private residence Living Arrangements: Spouse/significant other;Children (son) Available Help at Discharge: Family;Available 24 hours/day (wife does not work) Type of Home: Mobile home Home Access: [place]: One [place]: Environmental consultant - 2 wheels;Cane - single point;Shower seat;Hand held shower head;Wheelchair - power;Walker - standard Additional Comments: Pt likely to return to [facility] for further treatment of wounds on L foot    Prior Function Level of Independence: Independent with assistive device(s)         Comments: Uses SPC for mobility     Hand Dominance   Dominant Hand: Right    Extremity/Trunk Assessment   Upper Extremity Assessment: Defer to OT evaluation           Lower Extremity Assessment: RLE deficits/detail RLE Deficits / Details: BKA with prosthetic    Cervical / Trunk Assessment: Normal  Communication   Communication: No difficulties  Cognition Arousal/Alertness: Awake/alert Behavior During Therapy: Flat affect Overall Cognitive Status: Within Functional  Limits for tasks assessed                      General Comments General comments (skin integrity, edema, etc.): Dons/doffs Rt BKA prosthetic independently    Exercises        Assessment/Plan    PT Assessment Patent does not need any further PT services  PT Diagnosis  Abnormality of gait   PT Problem List    PT Treatment Interventions     PT Goals (Current goals can be found in the Care Plan section) Acute Rehab PT Goals Patient Stated Goal: to go home PT Goal Formulation: All assessment and education complete, DC therapy    Frequency     Barriers to discharge        Co-evaluation               End of Session   Activity Tolerance: Patient tolerated treatment well Patient left: in chair;with call bell/phone within reach           Time: 1111-1126 PT Time Calculation (min) (ACUTE ONLY): 15 min   Charges:   PT Evaluation $Initial PT Evaluation Tier I: 1 Procedure     PT G Codes:        Eduardo Stewart Aug 31, 2015, 1:10 PM Pager [phone]

## 2015-08-02 NOTE — Progress Notes (Signed)
Occupational Therapy Evaluation Patient Details Name: Eduardo Stewart MRN: [id number] DOB: [birth year] Today's Date: 08/02/2015    History of Present Illness 49 y.o. male admitted for altered mental status from [facility]. Pt is at that facility for his chronic lower extremity wounds and osteomyelitis. He apparently fell from wheelchair and struck his head on the ground and was found to be confused.   Clinical Impression   PTA, pt was at [facility] for wound care. Pt appears to be at cognitive baseline. Completed ADLs and mobility with min guard assist for some unsteadiness upon standing. Pt will benefit from acute skilled OT to increase independence and safety with ADLs and mobility to allow safe discharge home.    Follow Up Recommendations  No OT follow up    Equipment Recommendations  None recommended by OT    Recommendations for Other Services       Precautions / Restrictions Precautions Precautions: Fall Restrictions Weight Bearing Restrictions: No      Mobility Bed Mobility               General bed mobility comments: Pt up in chair on OT arrival  Transfers Overall transfer level: Needs assistance Equipment used: None Transfers: Sit to/from Stand Sit to Stand: Min guard         General transfer comment: Min guard for safety due to slight unsteadiness upon standing. No physical assist required    Balance Overall balance assessment: Needs assistance Sitting-balance support: No upper extremity supported;Feet supported Sitting balance-Leahy Scale: Normal     Standing balance support: No upper extremity supported;During functional activity Standing balance-Leahy Scale: Fair Standing balance comment: Had to lean trunk up against sink counter to support himself during grooming tasks                            ADL Overall ADL's : Needs assistance/impaired     Grooming: Wash/dry hands;Wash/dry face;Min  guard;Standing           Upper Body Dressing : Supervision/safety;Sitting   Lower Body Dressing: Supervision/safety;Sit to/from stand Lower Body Dressing Details (indicate cue type and reason): To don prosthetic Toilet Transfer: Min guard;Ambulation;Regular Toilet;Grab bars   Toileting- Clothing Manipulation and Hygiene: Min guard;Sit to/from stand       Functional mobility during ADLs: Min guard (used IV pole for balance)       Vision Vision Assessment?: Yes Eye Alignment: Within Functional Limits Ocular Range of Motion: Within Functional Limits Alignment/Gaze Preference: Within Defined Limits Tracking/Visual Pursuits: Able to track stimulus in all quads without difficulty Saccades: Within functional limits Convergence: Within functional limits Visual Fields: Impaired-to be further tested in functional context (Peripheral field cuts (~30 degrees))   Perception     Praxis      Pertinent Vitals/Pain Pain Assessment: No/denies pain     Hand Dominance Right   Extremity/Trunk Assessment Upper Extremity Assessment Upper Extremity Assessment: Overall WFL for tasks assessed (Diabetic neuropathy in both hands)   Lower Extremity Assessment Lower Extremity Assessment: RLE deficits/detail RLE Deficits / Details: BKA with prosthetic RLE Sensation: history of peripheral neuropathy   Cervical / Trunk Assessment Cervical / Trunk Assessment: Normal   Communication Communication Communication: No difficulties   Cognition Arousal/Alertness: Awake/alert Behavior During Therapy: Flat affect Overall Cognitive Status: Within Functional Limits for tasks assessed                     General Comments  Exercises       Shoulder Instructions      Home Living Family/patient expects to be discharged to:: Private residence Living Arrangements: Spouse/significant other;Children Available Help at Discharge: Family;Available 24 hours/day Type of Home: Mobile  home Home Access: Ramped entrance     Home Layout: One level     Bathroom Shower/Tub: Tub/shower unit;Curtain Shower/tub characteristics: Architectural technologist: Standard     Home Equipment: Environmental consultant - 2 wheels;Cane - single point;Shower seat;Hand held shower head;Wheelchair - power   Additional Comments: Pt likely to return to [facility] for further treatment of wounds on L foot      Prior Functioning/Environment Level of Independence: Independent with assistive device(s)        Comments: Uses SPC for mobility    OT Diagnosis: Altered mental status   OT Problem List: Decreased activity tolerance;Impaired balance (sitting and/or standing);Decreased coordination;Decreased safety awareness;Decreased knowledge of use of DME or AE;Impaired sensation;Decreased strength   OT Treatment/Interventions: Self-care/ADL training;Therapeutic exercise;DME and/or AE instruction;Energy conservation;Therapeutic activities;Patient/family education;Balance training    OT Goals(Current goals can be found in the care plan section) Acute Rehab OT Goals Patient Stated Goal: to go home OT Goal Formulation: With patient Time For Goal Achievement: 08/16/15 Potential to Achieve Goals: Good ADL Goals Pt Will Perform Lower Body Bathing: with modified independence;sitting/lateral leans Pt Will Perform Lower Body Dressing: with modified independence;sitting/lateral leans Pt Will Perform Tub/Shower Transfer: Tub transfer;with modified independence;ambulating;shower seat  OT Frequency: Min 2X/week   Barriers to D/C:            Co-evaluation              End of Session Equipment Utilized During Treatment: Gait belt;Other (comment) (R leg prosthetic) Nurse Communication: Mobility status  Activity Tolerance: Patient tolerated treatment well Patient left: in chair;with chair alarm set;with call bell/phone within reach   Time: [id number] OT Time Calculation (min): 21 min Charges:   OT General Charges $OT Visit: 1 Procedure OT Evaluation $Initial OT Evaluation Tier I: 1 Procedure OT Treatments $Self Care/Home Management : 8-22 mins G-Codes:    Redmond Baseman, OTR/L 08-30-15, 12:53 PM Pager: [phone]

## 2015-08-03 DIAGNOSIS — M869 Osteomyelitis, unspecified: Secondary | ICD-10-CM

## 2015-08-03 LAB — GLUCOSE, CAPILLARY
Glucose-Capillary: 177 mg/dL — ABNORMAL HIGH (ref 65–99)
Glucose-Capillary: 185 mg/dL — ABNORMAL HIGH (ref 65–99)

## 2015-08-03 LAB — BASIC METABOLIC PANEL
ANION GAP: 11 (ref 5–15)
BUN: 30 mg/dL — ABNORMAL HIGH (ref 6–20)
CALCIUM: 9.3 mg/dL (ref 8.9–10.3)
CO2: 22 mmol/L (ref 22–32)
Chloride: 104 mmol/L (ref 101–111)
Creatinine, Ser: 1.44 mg/dL — ABNORMAL HIGH (ref 0.61–1.24)
GFR, EST NON AFRICAN AMERICAN: 55 mL/min — AB (ref >60)
Glucose, Bld: 241 mg/dL — ABNORMAL HIGH (ref 65–99)
Potassium: 4.1 mmol/L (ref 3.5–5.1)
SODIUM: 137 mmol/L (ref 135–145)

## 2015-08-03 LAB — CULTURE, BLOOD (ROUTINE X 2)
CULTURE: NO GROWTH
CULTURE: NO GROWTH

## 2015-08-03 LAB — CBC
HCT: 25.1 % — ABNORMAL LOW (ref 39.0–52.0)
HEMOGLOBIN: 8.8 g/dL — AB (ref 13.0–17.0)
MCH: 29 pg (ref 26.0–34.0)
MCHC: 35.1 g/dL (ref 30.0–36.0)
MCV: 82.8 fL (ref 78.0–100.0)
PLATELETS: 94 10*3/uL — AB (ref 150–400)
RBC: 3.03 MIL/uL — AB (ref 4.22–5.81)
RDW: 13.8 % (ref 11.5–15.5)
WBC: 4.8 10*3/uL (ref 4.0–10.5)

## 2015-08-03 LAB — CK: CK TOTAL: 724 U/L — AB (ref 49–397)

## 2015-08-03 MED ORDER — HEPARIN SOD (PORK) LOCK FLUSH 100 UNIT/ML IV SOLN
250.0000 [IU] | INTRAVENOUS | Status: AC | PRN
Start: 2015-08-03 — End: 2015-08-03
  Administered 2015-08-03: 250 [IU]

## 2015-08-03 MED ORDER — INSULIN DETEMIR 100 UNIT/ML ~~LOC~~ SOLN: 5.0000 [IU] | Freq: Every day | SUBCUTANEOUS | Status: DC

## 2015-08-03 MED ORDER — SODIUM CHLORIDE 0.9 % IJ SOLN
10.0000 mL | INTRAMUSCULAR | Status: DC | PRN
  Administered 2015-08-03: 10 mL
  Filled 2015-08-03: qty 40

## 2015-08-03 MED ORDER — TIGECYCLINE 50 MG IV SOLR: 50.0000 mg | Freq: Two times a day (BID) | INTRAVENOUS | Status: DC

## 2015-08-03 MED ORDER — TRAZODONE HCL 50 MG PO TABS: 50.0000 mg | ORAL_TABLET | Freq: Every evening | ORAL | Status: DC | PRN

## 2015-08-03 NOTE — Clinical Social Work Note (Signed)
CSW was informed that patient needs ambulance transport to [place] contacted [facility] and set up ambulance transport.  CSW to sign off please reconsult if other social work needs arise.  Jones Broom. [place], MSW, [place] 08/03/2015 12:25 PM

## 2015-08-03 NOTE — Progress Notes (Signed)
Peripherally Inserted Central Catheter/Midline Placement  The IV Nurse has discussed with the patient and/or persons authorized to consent for the patient, the purpose of this procedure and the potential benefits and risks involved with this procedure.  The benefits include less needle sticks, lab draws from the catheter and patient may be discharged home with the catheter.  Risks include, but not limited to, infection, bleeding, blood clot (thrombus formation), and puncture of an artery; nerve damage and irregular heat beat.  Alternatives to this procedure were also discussed.  PICC/Midline Placement Documentation        Eduardo Stewart 08/03/2015, 8:58 AM

## 2015-08-03 NOTE — Progress Notes (Signed)
Occupational Therapy Treatment/Discharge  Patient Details Name: Eduardo Stewart MRN: [id number] DOB: Feb 15, 1966 Today's Date: 08/03/2015    History of present illness 49 y.o. male admitted for altered mental status from [facility]. Pt is at that facility for his chronic lower extremity wounds and osteomyelitis. He apparently fell from wheelchair and struck his head on the ground and was found to be confused. PMHx- Rt BKA   OT comments  Pt with no further acute OT needs. Pt completed all ADLs and mobility at mod I level. All education completed and pt has no further questions. OT signing off.   Follow Up Recommendations  No OT follow up    Equipment Recommendations  None recommended by OT    Recommendations for Other Services      Precautions / Restrictions Precautions Precautions: Fall Restrictions Weight Bearing Restrictions: No       Mobility Bed Mobility Overal bed mobility: Modified Independent                Transfers Overall transfer level: Modified independent Equipment used: None Transfers: Sit to/from Stand Sit to Stand: Modified independent (Device/Increase time)              Balance Overall balance assessment: Needs assistance Sitting-balance support: No upper extremity supported;Feet supported Sitting balance-Leahy Scale: Normal     Standing balance support: No upper extremity supported;During functional activity Standing balance-Leahy Scale: Fair Standing balance comment: Overt LOB x2 during dynamic standing tasks required UE support on rail to regain balance                   ADL Overall ADL's : Modified independent                     Lower Body Dressing: Modified independent;Sit to/from stand   Toilet Transfer: Modified Independent;Ambulation;Regular Toilet   Toileting- Clothing Manipulation and Hygiene: Modified independent;Sit to/from stand   Tub/ Shower Transfer: Tub transfer;Modified  independent;Ambulation;Shower seat   Functional mobility during ADLs: Modified independent General ADL Comments: Pt functioning close to baseline - still feels weak. Lob x2 while ambulating in hallway and had to grab onto rail to regain balance. Discussed fall prevention strategies.      Vision                     Perception     Praxis      Cognition   Behavior During Therapy: WFL for tasks assessed/performed Overall Cognitive Status: Within Functional Limits for tasks assessed                       Extremity/Trunk Assessment               Exercises     Shoulder Instructions       General Comments      Pertinent Vitals/ Pain       Pain Assessment: No/denies pain  Home Living                                          Prior Functioning/Environment              Frequency       Progress Toward Goals  OT Goals(current goals can now be found in the care plan section)  Progress towards OT goals: Goals met/education completed, patient discharged from OT  Acute Rehab OT Goals Patient Stated Goal: to go home OT Goal Formulation: With patient Time For Goal Achievement: 08/16/15 Potential to Achieve Goals: Good ADL Goals Pt Will Perform Lower Body Bathing: with modified independence;sitting/lateral leans Pt Will Perform Lower Body Dressing: with modified independence;sitting/lateral leans Pt Will Perform Tub/Shower Transfer: Tub transfer;with modified independence;ambulating;shower seat  Plan All goals met and education completed, patient discharged from OT services    Co-evaluation                 End of Session Equipment Utilized During Treatment: Gait belt   Activity Tolerance Patient tolerated treatment well   Patient Left in chair;with call bell/phone within reach   Nurse Communication          Time: 1207-1216 OT Time Calculation (min): 9 min  Charges: OT General Charges $OT Visit: 1 Procedure OT  Treatments $Self Care/Home Management : 8-22 mins  Redmond Baseman, OTR/L 08/03/2015, 1:08 PM Pager: [phone]

## 2015-08-03 NOTE — Discharge Summary (Signed)
[facility]  Physician Discharge Summary   Patient ID: Eduardo Stewart MRN: [id number] DOB/AGE: 1966-07-01 49 y.o.  Admit date: 07/29/2015 Discharge date: 08/03/2015  PCP: Leola Brazil, MD  DISCHARGE DIAGNOSES:  Principal Problem:   Acute encephalopathy Active Problems:   Normocytic anemia   Thrombocytopenia ([place])   Peripheral vascular disease ([place])   Wound of left lower extremity   Lip laceration   Diabetes mellitus type 2 in obese (HCC)   Hyperkalemia   CHF (congestive heart failure) ([place])   Disorientation   Fall   Osteomyelitis of toe of left foot (HCC)   Elevated CK   RECOMMENDATIONS FOR OUTPATIENT FOLLOW UP: Please check CK levels and BMET every 3 days for now.  Patient to stay well hydrated.  Please resume Lasix next week if renal function stays stable.  Please resume statin if CK levels improve. Antibiotics to continue for 2 more weeks. Please continue to monitor CBGs and utilize sliding scale coverage  DISCHARGE CONDITION: fair  Diet recommendation: Modified carbohydrate  Filed Weights   07/30/15 0232 07/31/15 0444  Weight: 95.3 kg (210 lb 1.6 oz) 91.8 kg (202 lb 6.1 oz)    INITIAL HISTORY: 49 year old Caucasian male [facility] for altered mental status. Patient is at that facility for his chronic lower extremity wounds and osteomyelitis. He is apparently getting antibiotics for same. He apparently fell from wheelchair and struck his head on the ground. He was found to be confused. He was hospitalized for further management. Records from that facility were reviewed.   Consultants: Infectious disease  Procedures:  EEG "Impression: this is a normal awake and asleep EEG. Please, be aware that a normal EEG does not exclude the possibility of epilepsy. "  Antibiotics: Aztreonam and doxycycline as discontinued on 12/26  Daptomycin started 12/26, stopped 12/27  Tigecycline started 12/27   HOSPITAL  COURSE:   Acute encephalopathy Patient has significantly improved. Appears to be back to baseline now. Etiology remains unclear, although it could be due to infection, dehydration, medications. Unclear if linezolid causes mental confusion. Patient was also noted to be on multiple psychotropic medications. He was on Cymbalta and fluoxetine. Also on Lyrica and gabapentin. All these medications likely played a role as well in his confusion. We have discontinued Cymbalta and gabapentin. He briefly required Seroquel. This has been discontinued as well. Discussed with Dr. Megan Salon with ID and he states that the linezolid and Cymbalta can interact and cause mental confusion and serotonin syndrome. MRI brain did not show any acute findings, although the study was degraded by motion. EEG is unremarkable. TSH and B-12 is normal. RPR and HIV are nonreactive. Reports from [facility] suggest that patient's mental status has been the same ever since he's been there, which has been about a week prior to this hospitalization.   Likely acute on chronic renal failure (unknown stage) with hyperkalemia Has improved with IV fluids. It was likely secondary to hypovolemia. Potassium is improved as well. Continue to monitor renal function.  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 Reports from [facility] facility were reviewed. Apparently, patient was diagnosed with abscess over his left foot along with osteomyelitis of the left third toe in October and then in early December by MRI. He underwent surgical debridement in October. He was apparently started on linezolid in early December and was discharged to [facility] facility on this antibiotic. Reports  did not include any culture data. Culture data reviewed from [facility]. He grew MRSA as well as Enterobacter faecalis. Dr. Megan Salon with infectious disease was consulted. Patient was  started on daptomycin initially. CK level noted to be elevated and so daptomycin was discontinued. Rolly Salter recommended initially Ceftaroline. However, patient has allergy to penicillin. In view of this tigecycline was recommended, which was started on 12/27. Linezolid was stopped due to significant thrombocytopenia and leukopenia. Since we do have these records no need to repeat MRI at this time. He will need antibiotics for 2 more weeks.  Elevated CK level Etiology unclear. Unknown if this is new or chronic elevation. This remained elevated, although he doesn't have any symptoms. He was noted to be on statin as well as fenofibrate. Both these medications have been discontinued. Recommend checking CK levels periodically.   Normocytic anemia No obvious overt bleeding. Hemoglobin remains stable. Anemia panel reviewed without any clear-cut deficiencies.   Thrombocytopenia and leukopenia Onset improving. Most likely secondary to the linezolid that he has been on at the nursing facility. Based on care-everywhere platelet counts were normal last year at [facility]. Counts are improving for now.  History of diabetes mellitus, type II on insulin HbA1c is 8.3. H&H and is on long-acting insulin. This has been resumed at a lower dose. Continue monitoring CBGs. Utilize sliding scale as indicated.  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 Patient uses prosthesis.  History of coronary artery disease and chronic systolic CHF History of cardiomyopathy with EF of 35% as of 2014 based on care-everywhere. 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 Patient was on Lasix at the time of admission. This could be resumed at a lower dose in a few days if renal function is stable. He will continue other medications for blood pressure control. Statin had to be stopped due to elevated CK levels as discussed above. May be resumed once CK levels improve.  Overall  he is much improved. PICC line has been placed this morning. Stable for discharge back to his long-term acute care facility.   PERTINENT LABS:  The results of significant diagnostics from this hospitalization (including imaging, microbiology, ancillary and laboratory) are listed below for reference.    Microbiology: Recent Results (from the past 240 hour(s))  Blood culture (routine x 2)     Status: None (Preliminary result)   Collection Time: 07/29/15  7:30 PM  Result Value Ref Range Status   Specimen Description BLOOD RIGHT HAND  Final   Special Requests BOTTLES DRAWN AEROBIC ONLY [place]  Final   Culture NO GROWTH 4 DAYS  Final   Report Status PENDING  Incomplete  Blood culture (routine x 2)     Status: None (Preliminary result)   Collection Time: 07/29/15  8:22 PM  Result Value Ref Range Status   Specimen Description BLOOD LEFT HAND  Final   Special Requests BOTTLES DRAWN AEROBIC AND ANAEROBIC 4CC  Final   Culture NO GROWTH 4 DAYS  Final   Report Status PENDING  Incomplete  MRSA PCR Screening     Status: None   Collection Time: 07/30/15  2:48 AM  Result Value Ref Range Status   MRSA by PCR NEGATIVE NEGATIVE Final    Comment:        The GeneXpert MRSA Assay (FDA approved for NASAL specimens only), is one component of a comprehensive MRSA colonization surveillance program. It is not intended to diagnose MRSA infection nor to  guide or monitor treatment for MRSA infections.   C difficile quick scan w PCR reflex     Status: None   Collection Time: 07/31/15  7:56 AM  Result Value Ref Range Status   C Diff antigen NEGATIVE NEGATIVE Final   C Diff toxin NEGATIVE NEGATIVE Final   C Diff interpretation Negative for toxigenic C. difficile  Final     Labs: Basic Metabolic Panel:  Recent Labs Lab 07/30/15 0357 07/31/15 0455 08/01/15 0259 08/02/15 0620 08/03/15 0441  NA 140 140 139 140 137  K 5.4* 4.7 4.3 3.9 4.1  CL 101 103 104 105 104  CO2 33* 30 28 26 22   GLUCOSE 169*  134* 160* 105* 241*  BUN 40* 24* 16 14 30*  CREATININE 2.05* 1.55* 1.32* 1.31* 1.44*  CALCIUM 9.0 8.9 9.0 9.4 9.3   Liver Function Tests:  Recent Labs Lab 07/29/15 1930 07/31/15 0455  AST 22 20  ALT 13* 12*  ALKPHOS 41 38  BILITOT 0.5 0.8  PROT 6.7 6.2*  ALBUMIN 3.8 3.1*   CBC:  Recent Labs Lab 07/29/15 1930  07/30/15 0357 07/31/15 0455 08/01/15 0259 08/02/15 0620 08/03/15 0441  WBC 3.8*  --  3.6*  3.9* 2.9* 3.0* 4.0 4.8  NEUTROABS 2.7  --  2.6  --   --   --   --   HGB 9.4*  < > 7.8*  8.2* 8.2* 7.9* 8.5* 8.8*  HCT 28.0*  < > 23.4*  24.4* 24.6* 23.8* 24.5* 25.1*  MCV 85.6  --  86.0  86.5 86.3 85.3 83.3 82.8  PLT 54*  --  48*  49* 49* 59* 65* 94*  < > = values in this interval not displayed. Cardiac Enzymes:  Recent Labs Lab 08/02/15 0620 08/03/15 0441  CKTOTAL 640* 724*   BNP: BNP (last 3 results)  Recent Labs  07/30/15 0357  BNP 550.4*    CBG:  Recent Labs Lab 08/02/15 0752 08/02/15 1135 08/02/15 1607 08/02/15 2141 08/03/15 0627  GLUCAP 101* 166* 72 264* 177*     IMAGING STUDIES Ct Head Wo Contrast  07/30/2015  CLINICAL DATA:  49 year old male with fall and altered mental status EXAM: CT HEAD WITHOUT CONTRAST TECHNIQUE: Contiguous axial images were obtained from the base of the skull through the vertex without intravenous contrast. COMPARISON:  CT dated 07/29/2015 FINDINGS: The ventricles and sulci are appropriate in size patient's age. Mild periventricular and deep white matter hypodensities represent chronic microvascular ischemic changes. There is no intracranial hemorrhage. No mass effect or midline shift identified. There is diffuse mucoperiosteal thickening and partial opacification of the paranasal sinuses. The mastoid air cells are well aerated. The calvarium is intact. IMPRESSION: No acute intracranial hemorrhage. Mild chronic microvascular ischemic disease. If symptoms persist and there are no contraindications, MRI may provide better  evaluation if clinically indicated Paranasal sinus disease. Electronically Signed   By: Anner Crete M.D.   On: 07/30/2015 01:23   Ct Head Wo Contrast  07/29/2015  CLINICAL DATA:  Altered mental status post fall onto face EXAM: CT HEAD WITHOUT CONTRAST TECHNIQUE: Contiguous axial images were obtained from the base of the skull through the vertex without intravenous contrast. COMPARISON:  None. FINDINGS: Mucoperiosteal thickening in frontal sinuses. Extensive opacification of ethmoid air cells. There is no evidence of acute intracranial hemorrhage, brain edema, mass lesion, acute infarction, mass effect, or midline shift. Acute infarct may be inapparent on noncontrast CT. No other intra-axial abnormalities are seen, and the ventricles and sulci are within  normal limits in size and symmetry. No abnormal extra-axial fluid collections or masses are identified. No significant calvarial abnormality. IMPRESSION: 1. Negative for bleed or other acute intracranial process. Electronically Signed   By: Lucrezia Europe M.D.   On: 07/29/2015 20:47   Mr Brain Wo Contrast  07/30/2015  CLINICAL DATA:  Wound infection.  Diabetes.  Recent falls. EXAM: MRI HEAD WITHOUT CONTRAST TECHNIQUE: Multiplanar, multiecho pulse sequences of the brain and surrounding structures were obtained without intravenous contrast. COMPARISON:  CT head 07/30/2015 FINDINGS: Image quality degraded by motion. Ventricle size normal.  Cerebral volume normal. Negative for acute infarction.  No significant chronic ischemia. Negative for hemorrhage or fluid collection Negative for mass or edema.  No shift of the midline structures. Extensive mucosal edema in the paranasal sinuses bilaterally without air-fluid level. Pituitary normal in size. IMPRESSION: Image quality degraded by motion. Allowing for this, no significant intracranial abnormality Extensive mucosal edema throughout the paranasal sinuses compatible with chronic sinusitis. Electronically Signed    By: Franchot Gallo M.D.   On: 07/30/2015 11:22   Dg Chest Port 1 View  07/29/2015  CLINICAL DATA:  Fall from a wheelchair today. EXAM: PORTABLE CHEST 1 VIEW COMPARISON:  None. FINDINGS: The mediastinal contour is normal. The heart size is enlarged. A right-sided venous line is identified with distal tip in the superior vena cava. There is a minimal left pleural effusion. There is no pulmonary edema or focal pneumonia. No acute fracture or dislocation is identified in the visualized bones. IMPRESSION: Minimal left pleural effusion.  No focal pneumonia. Electronically Signed   By: Abelardo Diesel M.D.   On: 07/29/2015 19:35   Dg Foot Complete Left  07/30/2015  CLINICAL DATA:  Wound infection. EXAM: LEFT FOOT - COMPLETE 3+ VIEW COMPARISON:  None. FINDINGS: No fracture or dislocation of mid foot or forefoot. The phalanges are normal. The calcaneus is normal. No soft tissue abnormality. No osseous erosion of the cortices to suggest osteomyelitis. IMPRESSION: No fracture or dislocation.  No osteomyelitis evident. Electronically Signed   By: Suzy Bouchard M.D.   On: 07/30/2015 11:07    DISCHARGE EXAMINATION: Filed Vitals:   08/02/15 2139 08/03/15 0208 08/03/15 0550 08/03/15 1002  BP: 120/68 149/80 161/83 165/95  Pulse: 72 69 69 73  Temp: 98.5 F (36.9 C) 98.7 F (37.1 C) 98.6 F (37 C) 98.2 F (36.8 C)  TempSrc: Oral Oral Oral Oral  Resp: 20 18 20 20   Height:      Weight:      SpO2: 98% 100% 100% 100%   General appearance: alert, cooperative, appears stated age and no distress Resp: clear to auscultation bilaterally Cardio: regular rate and rhythm, S1, S2 normal, no murmur, click, rub or gallop GI: soft, non-tender; bowel sounds normal; no masses,  no organomegaly Extremities: dry wound dorsum of left foot  DISPOSITION: [facility]  Discharge Instructions    Call MD for:  difficulty breathing, headache or visual disturbances    Complete by:  As directed      Call MD for:  extreme  fatigue    Complete by:  As directed      Call MD for:  persistant dizziness or light-headedness    Complete by:  As directed      Call MD for:  persistant nausea and vomiting    Complete by:  As directed      Call MD for:  severe uncontrolled pain    Complete by:  As directed  Call MD for:  temperature >100.4    Complete by:  As directed      Diet Carb Modified    Complete by:  As directed      Discharge instructions    Complete by:  As directed   Please check CK levels and BMET every 3 days for now. Patient to stay well hydrated. Please resume Lasix next week if renal function stays stable. Please resume statin if CK levels improve.  You were cared for by a hospitalist during your hospital stay. If you have any questions about your discharge medications or the care you received while you were in the hospital after you are discharged, you can call the unit and asked to speak with the hospitalist on call if the hospitalist that took care of you is not available. Once you are discharged, your primary care physician will handle any further medical issues. Please note that NO REFILLS for any discharge medications will be authorized once you are discharged, as it is imperative that you return to your primary care physician (or establish a relationship with a primary care physician if you do not have one) for your aftercare needs so that they can reassess your need for medications and monitor your lab values. If you do not have a primary care physician, you can call [phone] for a physician referral.     Increase activity slowly    Complete by:  As directed            ALLERGIES:  Allergies  Allergen Reactions  . Linezolid Other (See Comments)    Pancytopenia Dec 2016  . Penicillins     Unknown. Has patient had a PCN reaction causing immediate rash, facial/tongue/throat swelling, SOB or lightheadedness with hypotension: N/A Has patient had a PCN reaction causing severe rash involving  mucus membranes or skin necrosis: N/A Has patient had a PCN reaction that required hospitalization N/A Has patient had a PCN reaction occurring within the last 10 years: N/A If all of the above answers are "NO", then may proceed with Cephalosporin use.   . Vancomycin Other (See Comments)    Nephrotoxicity 2015     Current Discharge Medication List    START taking these medications   Details  loperamide (IMODIUM) 2 MG capsule Take 1 capsule (2 mg total) by mouth 3 (three) times daily as needed for diarrhea or loose stools. Qty: 30 capsule, Refills: 0    saccharomyces boulardii (FLORASTOR) 250 MG capsule Take 1 capsule (250 mg total) by mouth 2 (two) times daily.    tigecycline 50 mg in sodium chloride 0.9 % 100 mL Inject 50 mg into the vein every 12 (twelve) hours. For 2 more weeks.    traZODone (DESYREL) 50 MG tablet Take 1 tablet (50 mg total) by mouth at bedtime as needed for sleep.      CONTINUE these medications which have CHANGED   Details  insulin detemir (LEVEMIR) 100 UNIT/ML injection Inject 0.05 mLs (5 Units total) into the skin daily. Qty: 10 mL, Refills: 11      CONTINUE these medications which have NOT CHANGED   Details  cyanocobalamin 1000 MCG tablet Take 100 mcg by mouth daily.    ferrous sulfate 325 (65 FE) MG tablet Take 325 mg by mouth daily with breakfast.    FLUoxetine (PROZAC) 20 MG capsule Take 20 mg by mouth at bedtime.    isosorbide mononitrate (IMDUR) 60 MG 24 hr tablet Take 60 mg by mouth daily.  metoprolol succinate (TOPROL-XL) 100 MG 24 hr tablet Take 100 mg by mouth daily. Take with or immediately following a meal.    omega-3 acid ethyl esters (LOVAZA) 1 G capsule Take 1 g by mouth daily.    OXcarbazepine (TRILEPTAL) 150 MG tablet Take 150 mg by mouth every 12 (twelve) hours.    pantoprazole (PROTONIX) 40 MG tablet Take 40 mg by mouth daily.    pregabalin (LYRICA) 200 MG capsule Take 200 mg by mouth every 12 (twelve) hours.    ranolazine  (RANEXA) 500 MG 12 hr tablet Take 500 mg by mouth 2 (two) times daily.    tamsulosin (FLOMAX) 0.4 MG CAPS capsule Take 0.4 mg by mouth daily.      STOP taking these medications     atorvastatin (LIPITOR) 40 MG tablet      DULoxetine (CYMBALTA) 60 MG capsule      fenofibrate (TRICOR) 48 MG tablet      furosemide (LASIX) 40 MG tablet      gabapentin (NEURONTIN) 300 MG capsule      ibuprofen (ADVIL,MOTRIN) 400 MG tablet          TOTAL DISCHARGE TIME: 35 mins  [facility]  [facility] Pager [phone]  08/03/2015, 11:11 AM

## 2015-08-03 NOTE — Progress Notes (Signed)
Pt discharged to [facility] via [place]. Pleasant and cooperative, understood all discharge instructions

## 2015-08-03 NOTE — Care Management Note (Signed)
Case Management Note  Patient Details  Name: Eduardo Stewart MRN: [id number] Date of Birth: February 20, 1966  Subjective/Objective:                    Action/Plan: Patient admitted from [facility]. Plan is for patient to return to [place] today. Patient had PICC replaced this am. Seth Bake from [facility] informed of discharge from hospital and return to [facility] today. Will fax the information requested. Will update bedside RN.   Expected Discharge Date:   (Pending)               Expected Discharge Plan:  Long Term Acute Care (LTAC)  In-House Referral:     Discharge planning Services  CM Consult  Post Acute Care Choice:    Choice offered to:     DME Arranged:    DME Agency:     HH Arranged:    [place] Agency:     Status of Service:  Completed, signed off  Medicare Important Message Given:  Yes Date Medicare IM Given:    Medicare IM give by:    Date Additional Medicare IM Given:    Additional Medicare Important Message give by:     If discussed at [place] of Stay Meetings, dates discussed:    Additional Comments:  Pollie Friar, RN 08/03/2015, 10:50 AM

## 2015-08-05 DIAGNOSIS — E1151 Type 2 diabetes mellitus with diabetic peripheral angiopathy without gangrene: Secondary | ICD-10-CM | POA: Diagnosis not present

## 2015-08-05 DIAGNOSIS — M89672 Osteopathy after poliomyelitis, left ankle and foot: Secondary | ICD-10-CM | POA: Diagnosis not present

## 2015-08-05 DIAGNOSIS — I739 Peripheral vascular disease, unspecified: Secondary | ICD-10-CM | POA: Diagnosis not present

## 2015-08-05 DIAGNOSIS — E11621 Type 2 diabetes mellitus with foot ulcer: Secondary | ICD-10-CM | POA: Diagnosis not present

## 2015-08-06 DIAGNOSIS — E11621 Type 2 diabetes mellitus with foot ulcer: Secondary | ICD-10-CM | POA: Diagnosis not present

## 2015-08-06 DIAGNOSIS — M86172 Other acute osteomyelitis, left ankle and foot: Secondary | ICD-10-CM | POA: Diagnosis not present

## 2015-08-06 DIAGNOSIS — I739 Peripheral vascular disease, unspecified: Secondary | ICD-10-CM | POA: Diagnosis not present

## 2015-08-06 DIAGNOSIS — E1151 Type 2 diabetes mellitus with diabetic peripheral angiopathy without gangrene: Secondary | ICD-10-CM | POA: Diagnosis not present

## 2015-08-07 DIAGNOSIS — E11621 Type 2 diabetes mellitus with foot ulcer: Secondary | ICD-10-CM | POA: Diagnosis not present

## 2015-08-07 DIAGNOSIS — E1151 Type 2 diabetes mellitus with diabetic peripheral angiopathy without gangrene: Secondary | ICD-10-CM | POA: Diagnosis not present

## 2015-08-07 DIAGNOSIS — I739 Peripheral vascular disease, unspecified: Secondary | ICD-10-CM | POA: Diagnosis not present

## 2015-08-07 DIAGNOSIS — M86172 Other acute osteomyelitis, left ankle and foot: Secondary | ICD-10-CM | POA: Diagnosis not present

## 2015-08-08 DIAGNOSIS — E11621 Type 2 diabetes mellitus with foot ulcer: Secondary | ICD-10-CM | POA: Diagnosis not present

## 2015-08-08 DIAGNOSIS — M86172 Other acute osteomyelitis, left ankle and foot: Secondary | ICD-10-CM | POA: Diagnosis not present

## 2015-08-08 DIAGNOSIS — E1165 Type 2 diabetes mellitus with hyperglycemia: Secondary | ICD-10-CM | POA: Diagnosis not present

## 2015-08-08 DIAGNOSIS — I739 Peripheral vascular disease, unspecified: Secondary | ICD-10-CM | POA: Diagnosis not present

## 2015-08-09 DIAGNOSIS — E1151 Type 2 diabetes mellitus with diabetic peripheral angiopathy without gangrene: Secondary | ICD-10-CM | POA: Diagnosis not present

## 2015-08-09 DIAGNOSIS — I739 Peripheral vascular disease, unspecified: Secondary | ICD-10-CM | POA: Diagnosis not present

## 2015-08-09 DIAGNOSIS — M86172 Other acute osteomyelitis, left ankle and foot: Secondary | ICD-10-CM | POA: Diagnosis not present

## 2015-08-09 DIAGNOSIS — E11621 Type 2 diabetes mellitus with foot ulcer: Secondary | ICD-10-CM | POA: Diagnosis not present

## 2015-08-10 DIAGNOSIS — M86172 Other acute osteomyelitis, left ankle and foot: Secondary | ICD-10-CM | POA: Diagnosis not present

## 2015-08-10 DIAGNOSIS — E1140 Type 2 diabetes mellitus with diabetic neuropathy, unspecified: Secondary | ICD-10-CM | POA: Diagnosis not present

## 2015-08-10 DIAGNOSIS — E11621 Type 2 diabetes mellitus with foot ulcer: Secondary | ICD-10-CM | POA: Diagnosis not present

## 2015-08-10 DIAGNOSIS — N179 Acute kidney failure, unspecified: Secondary | ICD-10-CM | POA: Diagnosis not present

## 2015-08-11 DIAGNOSIS — I739 Peripheral vascular disease, unspecified: Secondary | ICD-10-CM | POA: Diagnosis not present

## 2015-08-11 DIAGNOSIS — M86172 Other acute osteomyelitis, left ankle and foot: Secondary | ICD-10-CM | POA: Diagnosis not present

## 2015-08-11 DIAGNOSIS — E11621 Type 2 diabetes mellitus with foot ulcer: Secondary | ICD-10-CM | POA: Diagnosis not present

## 2015-08-11 DIAGNOSIS — E1151 Type 2 diabetes mellitus with diabetic peripheral angiopathy without gangrene: Secondary | ICD-10-CM | POA: Diagnosis not present

## 2015-08-12 DIAGNOSIS — E1140 Type 2 diabetes mellitus with diabetic neuropathy, unspecified: Secondary | ICD-10-CM | POA: Diagnosis not present

## 2015-08-12 DIAGNOSIS — F331 Major depressive disorder, recurrent, moderate: Secondary | ICD-10-CM | POA: Diagnosis not present

## 2015-08-12 DIAGNOSIS — F419 Anxiety disorder, unspecified: Secondary | ICD-10-CM | POA: Diagnosis not present

## 2015-08-12 DIAGNOSIS — N179 Acute kidney failure, unspecified: Secondary | ICD-10-CM | POA: Diagnosis not present

## 2015-08-12 DIAGNOSIS — M86172 Other acute osteomyelitis, left ankle and foot: Secondary | ICD-10-CM | POA: Diagnosis not present

## 2015-08-12 DIAGNOSIS — E11621 Type 2 diabetes mellitus with foot ulcer: Secondary | ICD-10-CM | POA: Diagnosis not present

## 2015-08-13 DIAGNOSIS — N179 Acute kidney failure, unspecified: Secondary | ICD-10-CM | POA: Diagnosis not present

## 2015-08-13 DIAGNOSIS — E1140 Type 2 diabetes mellitus with diabetic neuropathy, unspecified: Secondary | ICD-10-CM | POA: Diagnosis not present

## 2015-08-13 DIAGNOSIS — E11621 Type 2 diabetes mellitus with foot ulcer: Secondary | ICD-10-CM | POA: Diagnosis not present

## 2015-08-13 DIAGNOSIS — M86172 Other acute osteomyelitis, left ankle and foot: Secondary | ICD-10-CM | POA: Diagnosis not present

## 2015-08-14 DIAGNOSIS — E1140 Type 2 diabetes mellitus with diabetic neuropathy, unspecified: Secondary | ICD-10-CM | POA: Diagnosis not present

## 2015-08-14 DIAGNOSIS — M86172 Other acute osteomyelitis, left ankle and foot: Secondary | ICD-10-CM | POA: Diagnosis not present

## 2015-08-14 DIAGNOSIS — N179 Acute kidney failure, unspecified: Secondary | ICD-10-CM | POA: Diagnosis not present

## 2015-08-14 DIAGNOSIS — E11621 Type 2 diabetes mellitus with foot ulcer: Secondary | ICD-10-CM | POA: Diagnosis not present

## 2015-08-15 DIAGNOSIS — F331 Major depressive disorder, recurrent, moderate: Secondary | ICD-10-CM | POA: Diagnosis not present

## 2015-08-15 DIAGNOSIS — N179 Acute kidney failure, unspecified: Secondary | ICD-10-CM | POA: Diagnosis not present

## 2015-08-15 DIAGNOSIS — M86172 Other acute osteomyelitis, left ankle and foot: Secondary | ICD-10-CM | POA: Diagnosis not present

## 2015-08-15 DIAGNOSIS — E11621 Type 2 diabetes mellitus with foot ulcer: Secondary | ICD-10-CM | POA: Diagnosis not present

## 2015-08-15 DIAGNOSIS — E1140 Type 2 diabetes mellitus with diabetic neuropathy, unspecified: Secondary | ICD-10-CM | POA: Diagnosis not present

## 2015-08-15 DIAGNOSIS — F419 Anxiety disorder, unspecified: Secondary | ICD-10-CM | POA: Diagnosis not present

## 2015-08-16 DIAGNOSIS — E1151 Type 2 diabetes mellitus with diabetic peripheral angiopathy without gangrene: Secondary | ICD-10-CM | POA: Diagnosis not present

## 2015-08-16 DIAGNOSIS — M86172 Other acute osteomyelitis, left ankle and foot: Secondary | ICD-10-CM | POA: Diagnosis not present

## 2015-08-16 DIAGNOSIS — I739 Peripheral vascular disease, unspecified: Secondary | ICD-10-CM | POA: Diagnosis not present

## 2015-08-16 DIAGNOSIS — E11621 Type 2 diabetes mellitus with foot ulcer: Secondary | ICD-10-CM | POA: Diagnosis not present

## 2015-08-17 DIAGNOSIS — E1165 Type 2 diabetes mellitus with hyperglycemia: Secondary | ICD-10-CM | POA: Diagnosis not present

## 2015-08-17 DIAGNOSIS — Q245 Malformation of coronary vessels: Secondary | ICD-10-CM | POA: Diagnosis not present

## 2015-08-17 DIAGNOSIS — E1151 Type 2 diabetes mellitus with diabetic peripheral angiopathy without gangrene: Secondary | ICD-10-CM | POA: Diagnosis not present

## 2015-08-17 DIAGNOSIS — M86172 Other acute osteomyelitis, left ankle and foot: Secondary | ICD-10-CM | POA: Diagnosis not present

## 2015-08-19 DIAGNOSIS — I25118 Atherosclerotic heart disease of native coronary artery with other forms of angina pectoris: Secondary | ICD-10-CM | POA: Diagnosis not present

## 2015-08-19 DIAGNOSIS — Z89511 Acquired absence of right leg below knee: Secondary | ICD-10-CM | POA: Diagnosis not present

## 2015-08-19 DIAGNOSIS — L97521 Non-pressure chronic ulcer of other part of left foot limited to breakdown of skin: Secondary | ICD-10-CM | POA: Diagnosis not present

## 2015-08-19 DIAGNOSIS — Z7984 Long term (current) use of oral hypoglycemic drugs: Secondary | ICD-10-CM | POA: Diagnosis not present

## 2015-08-19 DIAGNOSIS — I5022 Chronic systolic (congestive) heart failure: Secondary | ICD-10-CM | POA: Diagnosis not present

## 2015-08-19 DIAGNOSIS — E1151 Type 2 diabetes mellitus with diabetic peripheral angiopathy without gangrene: Secondary | ICD-10-CM | POA: Diagnosis not present

## 2015-08-19 DIAGNOSIS — I130 Hypertensive heart and chronic kidney disease with heart failure and stage 1 through stage 4 chronic kidney disease, or unspecified chronic kidney disease: Secondary | ICD-10-CM | POA: Diagnosis not present

## 2015-08-19 DIAGNOSIS — E11621 Type 2 diabetes mellitus with foot ulcer: Secondary | ICD-10-CM | POA: Diagnosis not present

## 2015-08-19 DIAGNOSIS — E1140 Type 2 diabetes mellitus with diabetic neuropathy, unspecified: Secondary | ICD-10-CM | POA: Diagnosis not present

## 2015-08-19 DIAGNOSIS — N183 Chronic kidney disease, stage 3 (moderate): Secondary | ICD-10-CM | POA: Diagnosis not present

## 2015-08-19 DIAGNOSIS — R55 Syncope and collapse: Secondary | ICD-10-CM | POA: Diagnosis not present

## 2015-08-19 DIAGNOSIS — G894 Chronic pain syndrome: Secondary | ICD-10-CM | POA: Diagnosis not present

## 2015-08-19 DIAGNOSIS — E1122 Type 2 diabetes mellitus with diabetic chronic kidney disease: Secondary | ICD-10-CM | POA: Diagnosis not present

## 2015-08-19 DIAGNOSIS — N179 Acute kidney failure, unspecified: Secondary | ICD-10-CM | POA: Diagnosis not present

## 2015-08-22 DIAGNOSIS — E1151 Type 2 diabetes mellitus with diabetic peripheral angiopathy without gangrene: Secondary | ICD-10-CM | POA: Diagnosis not present

## 2015-08-22 DIAGNOSIS — L97521 Non-pressure chronic ulcer of other part of left foot limited to breakdown of skin: Secondary | ICD-10-CM | POA: Diagnosis not present

## 2015-08-22 DIAGNOSIS — I5022 Chronic systolic (congestive) heart failure: Secondary | ICD-10-CM | POA: Diagnosis not present

## 2015-08-22 DIAGNOSIS — R55 Syncope and collapse: Secondary | ICD-10-CM | POA: Diagnosis not present

## 2015-08-22 DIAGNOSIS — Z7984 Long term (current) use of oral hypoglycemic drugs: Secondary | ICD-10-CM | POA: Diagnosis not present

## 2015-08-22 DIAGNOSIS — G894 Chronic pain syndrome: Secondary | ICD-10-CM | POA: Diagnosis not present

## 2015-08-22 DIAGNOSIS — N183 Chronic kidney disease, stage 3 (moderate): Secondary | ICD-10-CM | POA: Diagnosis not present

## 2015-08-22 DIAGNOSIS — E11621 Type 2 diabetes mellitus with foot ulcer: Secondary | ICD-10-CM | POA: Diagnosis not present

## 2015-08-22 DIAGNOSIS — I25118 Atherosclerotic heart disease of native coronary artery with other forms of angina pectoris: Secondary | ICD-10-CM | POA: Diagnosis not present

## 2015-08-22 DIAGNOSIS — E1122 Type 2 diabetes mellitus with diabetic chronic kidney disease: Secondary | ICD-10-CM | POA: Diagnosis not present

## 2015-08-22 DIAGNOSIS — Z89511 Acquired absence of right leg below knee: Secondary | ICD-10-CM | POA: Diagnosis not present

## 2015-08-22 DIAGNOSIS — I130 Hypertensive heart and chronic kidney disease with heart failure and stage 1 through stage 4 chronic kidney disease, or unspecified chronic kidney disease: Secondary | ICD-10-CM | POA: Diagnosis not present

## 2015-08-22 DIAGNOSIS — E1140 Type 2 diabetes mellitus with diabetic neuropathy, unspecified: Secondary | ICD-10-CM | POA: Diagnosis not present

## 2015-08-22 DIAGNOSIS — N179 Acute kidney failure, unspecified: Secondary | ICD-10-CM | POA: Diagnosis not present

## 2015-08-23 DIAGNOSIS — R55 Syncope and collapse: Secondary | ICD-10-CM | POA: Diagnosis not present

## 2015-08-23 DIAGNOSIS — Z89511 Acquired absence of right leg below knee: Secondary | ICD-10-CM | POA: Diagnosis not present

## 2015-08-23 DIAGNOSIS — N179 Acute kidney failure, unspecified: Secondary | ICD-10-CM | POA: Diagnosis not present

## 2015-08-23 DIAGNOSIS — E1140 Type 2 diabetes mellitus with diabetic neuropathy, unspecified: Secondary | ICD-10-CM | POA: Diagnosis not present

## 2015-08-23 DIAGNOSIS — Z7984 Long term (current) use of oral hypoglycemic drugs: Secondary | ICD-10-CM | POA: Diagnosis not present

## 2015-08-23 DIAGNOSIS — G894 Chronic pain syndrome: Secondary | ICD-10-CM | POA: Diagnosis not present

## 2015-08-23 DIAGNOSIS — I130 Hypertensive heart and chronic kidney disease with heart failure and stage 1 through stage 4 chronic kidney disease, or unspecified chronic kidney disease: Secondary | ICD-10-CM | POA: Diagnosis not present

## 2015-08-23 DIAGNOSIS — L97521 Non-pressure chronic ulcer of other part of left foot limited to breakdown of skin: Secondary | ICD-10-CM | POA: Diagnosis not present

## 2015-08-23 DIAGNOSIS — E1165 Type 2 diabetes mellitus with hyperglycemia: Secondary | ICD-10-CM | POA: Diagnosis not present

## 2015-08-23 DIAGNOSIS — E1151 Type 2 diabetes mellitus with diabetic peripheral angiopathy without gangrene: Secondary | ICD-10-CM | POA: Diagnosis not present

## 2015-08-23 DIAGNOSIS — L03116 Cellulitis of left lower limb: Secondary | ICD-10-CM | POA: Diagnosis not present

## 2015-08-23 DIAGNOSIS — E11621 Type 2 diabetes mellitus with foot ulcer: Secondary | ICD-10-CM | POA: Diagnosis not present

## 2015-08-23 DIAGNOSIS — N183 Chronic kidney disease, stage 3 (moderate): Secondary | ICD-10-CM | POA: Diagnosis not present

## 2015-08-23 DIAGNOSIS — I5022 Chronic systolic (congestive) heart failure: Secondary | ICD-10-CM | POA: Diagnosis not present

## 2015-08-23 DIAGNOSIS — Z794 Long term (current) use of insulin: Secondary | ICD-10-CM | POA: Diagnosis not present

## 2015-08-23 DIAGNOSIS — E1122 Type 2 diabetes mellitus with diabetic chronic kidney disease: Secondary | ICD-10-CM | POA: Diagnosis not present

## 2015-08-23 DIAGNOSIS — I25118 Atherosclerotic heart disease of native coronary artery with other forms of angina pectoris: Secondary | ICD-10-CM | POA: Diagnosis not present

## 2015-08-26 DIAGNOSIS — Z7984 Long term (current) use of oral hypoglycemic drugs: Secondary | ICD-10-CM | POA: Diagnosis not present

## 2015-08-26 DIAGNOSIS — E1122 Type 2 diabetes mellitus with diabetic chronic kidney disease: Secondary | ICD-10-CM | POA: Diagnosis not present

## 2015-08-26 DIAGNOSIS — N179 Acute kidney failure, unspecified: Secondary | ICD-10-CM | POA: Diagnosis not present

## 2015-08-26 DIAGNOSIS — G894 Chronic pain syndrome: Secondary | ICD-10-CM | POA: Diagnosis not present

## 2015-08-26 DIAGNOSIS — I25118 Atherosclerotic heart disease of native coronary artery with other forms of angina pectoris: Secondary | ICD-10-CM | POA: Diagnosis not present

## 2015-08-26 DIAGNOSIS — R55 Syncope and collapse: Secondary | ICD-10-CM | POA: Diagnosis not present

## 2015-08-26 DIAGNOSIS — L97521 Non-pressure chronic ulcer of other part of left foot limited to breakdown of skin: Secondary | ICD-10-CM | POA: Diagnosis not present

## 2015-08-26 DIAGNOSIS — Z89511 Acquired absence of right leg below knee: Secondary | ICD-10-CM | POA: Diagnosis not present

## 2015-08-26 DIAGNOSIS — E11621 Type 2 diabetes mellitus with foot ulcer: Secondary | ICD-10-CM | POA: Diagnosis not present

## 2015-08-26 DIAGNOSIS — E1151 Type 2 diabetes mellitus with diabetic peripheral angiopathy without gangrene: Secondary | ICD-10-CM | POA: Diagnosis not present

## 2015-08-26 DIAGNOSIS — I130 Hypertensive heart and chronic kidney disease with heart failure and stage 1 through stage 4 chronic kidney disease, or unspecified chronic kidney disease: Secondary | ICD-10-CM | POA: Diagnosis not present

## 2015-08-26 DIAGNOSIS — I5022 Chronic systolic (congestive) heart failure: Secondary | ICD-10-CM | POA: Diagnosis not present

## 2015-08-26 DIAGNOSIS — E1140 Type 2 diabetes mellitus with diabetic neuropathy, unspecified: Secondary | ICD-10-CM | POA: Diagnosis not present

## 2015-08-26 DIAGNOSIS — N183 Chronic kidney disease, stage 3 (moderate): Secondary | ICD-10-CM | POA: Diagnosis not present

## 2015-08-29 DIAGNOSIS — M542 Cervicalgia: Secondary | ICD-10-CM | POA: Diagnosis not present

## 2015-08-29 DIAGNOSIS — R51 Headache: Secondary | ICD-10-CM | POA: Diagnosis not present

## 2015-08-29 DIAGNOSIS — R031 Nonspecific low blood-pressure reading: Secondary | ICD-10-CM | POA: Diagnosis not present

## 2015-08-29 DIAGNOSIS — R55 Syncope and collapse: Secondary | ICD-10-CM | POA: Diagnosis not present

## 2015-08-29 DIAGNOSIS — I959 Hypotension, unspecified: Secondary | ICD-10-CM | POA: Diagnosis not present

## 2015-08-30 DIAGNOSIS — N179 Acute kidney failure, unspecified: Secondary | ICD-10-CM | POA: Diagnosis not present

## 2015-08-30 DIAGNOSIS — I25118 Atherosclerotic heart disease of native coronary artery with other forms of angina pectoris: Secondary | ICD-10-CM | POA: Diagnosis not present

## 2015-08-30 DIAGNOSIS — N183 Chronic kidney disease, stage 3 (moderate): Secondary | ICD-10-CM | POA: Diagnosis not present

## 2015-08-30 DIAGNOSIS — I5022 Chronic systolic (congestive) heart failure: Secondary | ICD-10-CM | POA: Diagnosis not present

## 2015-08-30 DIAGNOSIS — E1122 Type 2 diabetes mellitus with diabetic chronic kidney disease: Secondary | ICD-10-CM | POA: Diagnosis not present

## 2015-08-30 DIAGNOSIS — G894 Chronic pain syndrome: Secondary | ICD-10-CM | POA: Diagnosis not present

## 2015-08-30 DIAGNOSIS — E11621 Type 2 diabetes mellitus with foot ulcer: Secondary | ICD-10-CM | POA: Diagnosis not present

## 2015-08-30 DIAGNOSIS — Z7984 Long term (current) use of oral hypoglycemic drugs: Secondary | ICD-10-CM | POA: Diagnosis not present

## 2015-08-30 DIAGNOSIS — E1140 Type 2 diabetes mellitus with diabetic neuropathy, unspecified: Secondary | ICD-10-CM | POA: Diagnosis not present

## 2015-08-30 DIAGNOSIS — R55 Syncope and collapse: Secondary | ICD-10-CM | POA: Diagnosis not present

## 2015-08-30 DIAGNOSIS — L97521 Non-pressure chronic ulcer of other part of left foot limited to breakdown of skin: Secondary | ICD-10-CM | POA: Diagnosis not present

## 2015-08-30 DIAGNOSIS — E1151 Type 2 diabetes mellitus with diabetic peripheral angiopathy without gangrene: Secondary | ICD-10-CM | POA: Diagnosis not present

## 2015-08-30 DIAGNOSIS — Z89511 Acquired absence of right leg below knee: Secondary | ICD-10-CM | POA: Diagnosis not present

## 2015-08-30 DIAGNOSIS — I130 Hypertensive heart and chronic kidney disease with heart failure and stage 1 through stage 4 chronic kidney disease, or unspecified chronic kidney disease: Secondary | ICD-10-CM | POA: Diagnosis not present

## 2015-09-01 DIAGNOSIS — Z794 Long term (current) use of insulin: Secondary | ICD-10-CM | POA: Diagnosis not present

## 2015-09-01 DIAGNOSIS — E1165 Type 2 diabetes mellitus with hyperglycemia: Secondary | ICD-10-CM | POA: Diagnosis not present

## 2015-09-01 DIAGNOSIS — N183 Chronic kidney disease, stage 3 (moderate): Secondary | ICD-10-CM | POA: Diagnosis not present

## 2015-09-01 DIAGNOSIS — E11621 Type 2 diabetes mellitus with foot ulcer: Secondary | ICD-10-CM | POA: Diagnosis not present

## 2015-09-02 DIAGNOSIS — R55 Syncope and collapse: Secondary | ICD-10-CM | POA: Diagnosis not present

## 2015-09-02 DIAGNOSIS — N179 Acute kidney failure, unspecified: Secondary | ICD-10-CM | POA: Diagnosis not present

## 2015-09-02 DIAGNOSIS — E11621 Type 2 diabetes mellitus with foot ulcer: Secondary | ICD-10-CM | POA: Diagnosis not present

## 2015-09-02 DIAGNOSIS — E1140 Type 2 diabetes mellitus with diabetic neuropathy, unspecified: Secondary | ICD-10-CM | POA: Diagnosis not present

## 2015-09-02 DIAGNOSIS — Z89511 Acquired absence of right leg below knee: Secondary | ICD-10-CM | POA: Diagnosis not present

## 2015-09-02 DIAGNOSIS — L97521 Non-pressure chronic ulcer of other part of left foot limited to breakdown of skin: Secondary | ICD-10-CM | POA: Diagnosis not present

## 2015-09-02 DIAGNOSIS — E1151 Type 2 diabetes mellitus with diabetic peripheral angiopathy without gangrene: Secondary | ICD-10-CM | POA: Diagnosis not present

## 2015-09-02 DIAGNOSIS — Z7984 Long term (current) use of oral hypoglycemic drugs: Secondary | ICD-10-CM | POA: Diagnosis not present

## 2015-09-02 DIAGNOSIS — G894 Chronic pain syndrome: Secondary | ICD-10-CM | POA: Diagnosis not present

## 2015-09-02 DIAGNOSIS — I5022 Chronic systolic (congestive) heart failure: Secondary | ICD-10-CM | POA: Diagnosis not present

## 2015-09-02 DIAGNOSIS — N183 Chronic kidney disease, stage 3 (moderate): Secondary | ICD-10-CM | POA: Diagnosis not present

## 2015-09-02 DIAGNOSIS — I130 Hypertensive heart and chronic kidney disease with heart failure and stage 1 through stage 4 chronic kidney disease, or unspecified chronic kidney disease: Secondary | ICD-10-CM | POA: Diagnosis not present

## 2015-09-02 DIAGNOSIS — I25118 Atherosclerotic heart disease of native coronary artery with other forms of angina pectoris: Secondary | ICD-10-CM | POA: Diagnosis not present

## 2015-09-02 DIAGNOSIS — E1122 Type 2 diabetes mellitus with diabetic chronic kidney disease: Secondary | ICD-10-CM | POA: Diagnosis not present

## 2015-09-03 DIAGNOSIS — I2782 Chronic pulmonary embolism: Secondary | ICD-10-CM | POA: Diagnosis not present

## 2015-09-06 DIAGNOSIS — G894 Chronic pain syndrome: Secondary | ICD-10-CM | POA: Diagnosis not present

## 2015-09-06 DIAGNOSIS — I130 Hypertensive heart and chronic kidney disease with heart failure and stage 1 through stage 4 chronic kidney disease, or unspecified chronic kidney disease: Secondary | ICD-10-CM | POA: Diagnosis not present

## 2015-09-06 DIAGNOSIS — I25118 Atherosclerotic heart disease of native coronary artery with other forms of angina pectoris: Secondary | ICD-10-CM | POA: Diagnosis not present

## 2015-09-06 DIAGNOSIS — E1122 Type 2 diabetes mellitus with diabetic chronic kidney disease: Secondary | ICD-10-CM | POA: Diagnosis not present

## 2015-09-06 DIAGNOSIS — E1140 Type 2 diabetes mellitus with diabetic neuropathy, unspecified: Secondary | ICD-10-CM | POA: Diagnosis not present

## 2015-09-06 DIAGNOSIS — E11621 Type 2 diabetes mellitus with foot ulcer: Secondary | ICD-10-CM | POA: Diagnosis not present

## 2015-09-06 DIAGNOSIS — N179 Acute kidney failure, unspecified: Secondary | ICD-10-CM | POA: Diagnosis not present

## 2015-09-06 DIAGNOSIS — N183 Chronic kidney disease, stage 3 (moderate): Secondary | ICD-10-CM | POA: Diagnosis not present

## 2015-09-06 DIAGNOSIS — Z7984 Long term (current) use of oral hypoglycemic drugs: Secondary | ICD-10-CM | POA: Diagnosis not present

## 2015-09-06 DIAGNOSIS — Z89511 Acquired absence of right leg below knee: Secondary | ICD-10-CM | POA: Diagnosis not present

## 2015-09-06 DIAGNOSIS — L97521 Non-pressure chronic ulcer of other part of left foot limited to breakdown of skin: Secondary | ICD-10-CM | POA: Diagnosis not present

## 2015-09-06 DIAGNOSIS — R55 Syncope and collapse: Secondary | ICD-10-CM | POA: Diagnosis not present

## 2015-09-06 DIAGNOSIS — I5022 Chronic systolic (congestive) heart failure: Secondary | ICD-10-CM | POA: Diagnosis not present

## 2015-09-06 DIAGNOSIS — E1151 Type 2 diabetes mellitus with diabetic peripheral angiopathy without gangrene: Secondary | ICD-10-CM | POA: Diagnosis not present

## 2015-09-08 DIAGNOSIS — N183 Chronic kidney disease, stage 3 (moderate): Secondary | ICD-10-CM | POA: Diagnosis not present

## 2015-09-08 DIAGNOSIS — N179 Acute kidney failure, unspecified: Secondary | ICD-10-CM | POA: Diagnosis not present

## 2015-09-08 DIAGNOSIS — R55 Syncope and collapse: Secondary | ICD-10-CM | POA: Diagnosis not present

## 2015-09-08 DIAGNOSIS — L97521 Non-pressure chronic ulcer of other part of left foot limited to breakdown of skin: Secondary | ICD-10-CM | POA: Diagnosis not present

## 2015-09-08 DIAGNOSIS — Z7984 Long term (current) use of oral hypoglycemic drugs: Secondary | ICD-10-CM | POA: Diagnosis not present

## 2015-09-08 DIAGNOSIS — Z89511 Acquired absence of right leg below knee: Secondary | ICD-10-CM | POA: Diagnosis not present

## 2015-09-08 DIAGNOSIS — E1140 Type 2 diabetes mellitus with diabetic neuropathy, unspecified: Secondary | ICD-10-CM | POA: Diagnosis not present

## 2015-09-08 DIAGNOSIS — E1122 Type 2 diabetes mellitus with diabetic chronic kidney disease: Secondary | ICD-10-CM | POA: Diagnosis not present

## 2015-09-08 DIAGNOSIS — G894 Chronic pain syndrome: Secondary | ICD-10-CM | POA: Diagnosis not present

## 2015-09-08 DIAGNOSIS — E11621 Type 2 diabetes mellitus with foot ulcer: Secondary | ICD-10-CM | POA: Diagnosis not present

## 2015-09-08 DIAGNOSIS — I25118 Atherosclerotic heart disease of native coronary artery with other forms of angina pectoris: Secondary | ICD-10-CM | POA: Diagnosis not present

## 2015-09-08 DIAGNOSIS — I130 Hypertensive heart and chronic kidney disease with heart failure and stage 1 through stage 4 chronic kidney disease, or unspecified chronic kidney disease: Secondary | ICD-10-CM | POA: Diagnosis not present

## 2015-09-08 DIAGNOSIS — E1151 Type 2 diabetes mellitus with diabetic peripheral angiopathy without gangrene: Secondary | ICD-10-CM | POA: Diagnosis not present

## 2015-09-08 DIAGNOSIS — I5022 Chronic systolic (congestive) heart failure: Secondary | ICD-10-CM | POA: Diagnosis not present

## 2015-09-12 DIAGNOSIS — N179 Acute kidney failure, unspecified: Secondary | ICD-10-CM | POA: Diagnosis not present

## 2015-09-12 DIAGNOSIS — N183 Chronic kidney disease, stage 3 (moderate): Secondary | ICD-10-CM | POA: Diagnosis not present

## 2015-09-12 DIAGNOSIS — I25118 Atherosclerotic heart disease of native coronary artery with other forms of angina pectoris: Secondary | ICD-10-CM | POA: Diagnosis not present

## 2015-09-12 DIAGNOSIS — R55 Syncope and collapse: Secondary | ICD-10-CM | POA: Diagnosis not present

## 2015-09-12 DIAGNOSIS — E1151 Type 2 diabetes mellitus with diabetic peripheral angiopathy without gangrene: Secondary | ICD-10-CM | POA: Diagnosis not present

## 2015-09-12 DIAGNOSIS — E1122 Type 2 diabetes mellitus with diabetic chronic kidney disease: Secondary | ICD-10-CM | POA: Diagnosis not present

## 2015-09-12 DIAGNOSIS — L97521 Non-pressure chronic ulcer of other part of left foot limited to breakdown of skin: Secondary | ICD-10-CM | POA: Diagnosis not present

## 2015-09-12 DIAGNOSIS — E11621 Type 2 diabetes mellitus with foot ulcer: Secondary | ICD-10-CM | POA: Diagnosis not present

## 2015-09-12 DIAGNOSIS — Z7984 Long term (current) use of oral hypoglycemic drugs: Secondary | ICD-10-CM | POA: Diagnosis not present

## 2015-09-12 DIAGNOSIS — E1140 Type 2 diabetes mellitus with diabetic neuropathy, unspecified: Secondary | ICD-10-CM | POA: Diagnosis not present

## 2015-09-12 DIAGNOSIS — I5022 Chronic systolic (congestive) heart failure: Secondary | ICD-10-CM | POA: Diagnosis not present

## 2015-09-12 DIAGNOSIS — I130 Hypertensive heart and chronic kidney disease with heart failure and stage 1 through stage 4 chronic kidney disease, or unspecified chronic kidney disease: Secondary | ICD-10-CM | POA: Diagnosis not present

## 2015-09-12 DIAGNOSIS — G894 Chronic pain syndrome: Secondary | ICD-10-CM | POA: Diagnosis not present

## 2015-09-12 DIAGNOSIS — Z89511 Acquired absence of right leg below knee: Secondary | ICD-10-CM | POA: Diagnosis not present

## 2015-09-21 DIAGNOSIS — E1140 Type 2 diabetes mellitus with diabetic neuropathy, unspecified: Secondary | ICD-10-CM | POA: Diagnosis not present

## 2015-09-21 DIAGNOSIS — E1122 Type 2 diabetes mellitus with diabetic chronic kidney disease: Secondary | ICD-10-CM | POA: Diagnosis not present

## 2015-09-21 DIAGNOSIS — Z7984 Long term (current) use of oral hypoglycemic drugs: Secondary | ICD-10-CM | POA: Diagnosis not present

## 2015-09-21 DIAGNOSIS — N179 Acute kidney failure, unspecified: Secondary | ICD-10-CM | POA: Diagnosis not present

## 2015-09-21 DIAGNOSIS — I130 Hypertensive heart and chronic kidney disease with heart failure and stage 1 through stage 4 chronic kidney disease, or unspecified chronic kidney disease: Secondary | ICD-10-CM | POA: Diagnosis not present

## 2015-09-21 DIAGNOSIS — R55 Syncope and collapse: Secondary | ICD-10-CM | POA: Diagnosis not present

## 2015-09-21 DIAGNOSIS — I5022 Chronic systolic (congestive) heart failure: Secondary | ICD-10-CM | POA: Diagnosis not present

## 2015-09-21 DIAGNOSIS — N183 Chronic kidney disease, stage 3 (moderate): Secondary | ICD-10-CM | POA: Diagnosis not present

## 2015-09-21 DIAGNOSIS — I25118 Atherosclerotic heart disease of native coronary artery with other forms of angina pectoris: Secondary | ICD-10-CM | POA: Diagnosis not present

## 2015-09-21 DIAGNOSIS — E11621 Type 2 diabetes mellitus with foot ulcer: Secondary | ICD-10-CM | POA: Diagnosis not present

## 2015-09-21 DIAGNOSIS — Z89511 Acquired absence of right leg below knee: Secondary | ICD-10-CM | POA: Diagnosis not present

## 2015-09-21 DIAGNOSIS — E1151 Type 2 diabetes mellitus with diabetic peripheral angiopathy without gangrene: Secondary | ICD-10-CM | POA: Diagnosis not present

## 2015-09-21 DIAGNOSIS — L97521 Non-pressure chronic ulcer of other part of left foot limited to breakdown of skin: Secondary | ICD-10-CM | POA: Diagnosis not present

## 2015-09-21 DIAGNOSIS — G894 Chronic pain syndrome: Secondary | ICD-10-CM | POA: Diagnosis not present

## 2015-09-29 DIAGNOSIS — Z7984 Long term (current) use of oral hypoglycemic drugs: Secondary | ICD-10-CM | POA: Diagnosis not present

## 2015-09-29 DIAGNOSIS — I25118 Atherosclerotic heart disease of native coronary artery with other forms of angina pectoris: Secondary | ICD-10-CM | POA: Diagnosis not present

## 2015-09-29 DIAGNOSIS — E1122 Type 2 diabetes mellitus with diabetic chronic kidney disease: Secondary | ICD-10-CM | POA: Diagnosis not present

## 2015-09-29 DIAGNOSIS — E11621 Type 2 diabetes mellitus with foot ulcer: Secondary | ICD-10-CM | POA: Diagnosis not present

## 2015-09-29 DIAGNOSIS — G894 Chronic pain syndrome: Secondary | ICD-10-CM | POA: Diagnosis not present

## 2015-09-29 DIAGNOSIS — R55 Syncope and collapse: Secondary | ICD-10-CM | POA: Diagnosis not present

## 2015-09-29 DIAGNOSIS — I130 Hypertensive heart and chronic kidney disease with heart failure and stage 1 through stage 4 chronic kidney disease, or unspecified chronic kidney disease: Secondary | ICD-10-CM | POA: Diagnosis not present

## 2015-09-29 DIAGNOSIS — L97521 Non-pressure chronic ulcer of other part of left foot limited to breakdown of skin: Secondary | ICD-10-CM | POA: Diagnosis not present

## 2015-09-29 DIAGNOSIS — E1151 Type 2 diabetes mellitus with diabetic peripheral angiopathy without gangrene: Secondary | ICD-10-CM | POA: Diagnosis not present

## 2015-09-29 DIAGNOSIS — Z89511 Acquired absence of right leg below knee: Secondary | ICD-10-CM | POA: Diagnosis not present

## 2015-09-29 DIAGNOSIS — E1140 Type 2 diabetes mellitus with diabetic neuropathy, unspecified: Secondary | ICD-10-CM | POA: Diagnosis not present

## 2015-09-29 DIAGNOSIS — I5022 Chronic systolic (congestive) heart failure: Secondary | ICD-10-CM | POA: Diagnosis not present

## 2015-09-29 DIAGNOSIS — N183 Chronic kidney disease, stage 3 (moderate): Secondary | ICD-10-CM | POA: Diagnosis not present

## 2015-09-29 DIAGNOSIS — N179 Acute kidney failure, unspecified: Secondary | ICD-10-CM | POA: Diagnosis not present

## 2015-10-04 DIAGNOSIS — I2782 Chronic pulmonary embolism: Secondary | ICD-10-CM | POA: Diagnosis not present

## 2015-11-01 DIAGNOSIS — I2782 Chronic pulmonary embolism: Secondary | ICD-10-CM | POA: Diagnosis not present

## 2015-11-25 DIAGNOSIS — G629 Polyneuropathy, unspecified: Secondary | ICD-10-CM | POA: Diagnosis not present

## 2015-11-25 DIAGNOSIS — E1165 Type 2 diabetes mellitus with hyperglycemia: Secondary | ICD-10-CM | POA: Diagnosis not present

## 2015-11-25 DIAGNOSIS — Z794 Long term (current) use of insulin: Secondary | ICD-10-CM | POA: Diagnosis not present

## 2015-11-25 DIAGNOSIS — N183 Chronic kidney disease, stage 3 (moderate): Secondary | ICD-10-CM | POA: Diagnosis not present

## 2015-12-02 DIAGNOSIS — I2782 Chronic pulmonary embolism: Secondary | ICD-10-CM | POA: Diagnosis not present

## 2016-01-01 DIAGNOSIS — I2782 Chronic pulmonary embolism: Secondary | ICD-10-CM | POA: Diagnosis not present

## 2016-01-06 DIAGNOSIS — Z0000 Encounter for general adult medical examination without abnormal findings: Secondary | ICD-10-CM | POA: Diagnosis not present

## 2016-01-06 DIAGNOSIS — N183 Chronic kidney disease, stage 3 (moderate): Secondary | ICD-10-CM | POA: Diagnosis not present

## 2016-01-06 DIAGNOSIS — Z79899 Other long term (current) drug therapy: Secondary | ICD-10-CM | POA: Diagnosis not present

## 2016-01-06 DIAGNOSIS — Z1389 Encounter for screening for other disorder: Secondary | ICD-10-CM | POA: Diagnosis not present

## 2016-02-01 DIAGNOSIS — I2782 Chronic pulmonary embolism: Secondary | ICD-10-CM | POA: Diagnosis not present

## 2016-02-27 DIAGNOSIS — Z794 Long term (current) use of insulin: Secondary | ICD-10-CM | POA: Diagnosis not present

## 2016-02-27 DIAGNOSIS — G629 Polyneuropathy, unspecified: Secondary | ICD-10-CM | POA: Diagnosis not present

## 2016-02-27 DIAGNOSIS — E1165 Type 2 diabetes mellitus with hyperglycemia: Secondary | ICD-10-CM | POA: Diagnosis not present

## 2016-02-27 DIAGNOSIS — I739 Peripheral vascular disease, unspecified: Secondary | ICD-10-CM | POA: Diagnosis not present

## 2016-02-27 DIAGNOSIS — S81812A Laceration without foreign body, left lower leg, initial encounter: Secondary | ICD-10-CM | POA: Diagnosis not present

## 2016-03-01 DIAGNOSIS — R404 Transient alteration of awareness: Secondary | ICD-10-CM | POA: Diagnosis not present

## 2016-03-01 DIAGNOSIS — Z7982 Long term (current) use of aspirin: Secondary | ICD-10-CM | POA: Diagnosis not present

## 2016-03-01 DIAGNOSIS — R55 Syncope and collapse: Secondary | ICD-10-CM | POA: Diagnosis not present

## 2016-03-01 DIAGNOSIS — E871 Hypo-osmolality and hyponatremia: Secondary | ICD-10-CM | POA: Diagnosis not present

## 2016-03-01 DIAGNOSIS — A084 Viral intestinal infection, unspecified: Secondary | ICD-10-CM | POA: Diagnosis not present

## 2016-03-01 DIAGNOSIS — E1140 Type 2 diabetes mellitus with diabetic neuropathy, unspecified: Secondary | ICD-10-CM | POA: Diagnosis not present

## 2016-03-01 DIAGNOSIS — A419 Sepsis, unspecified organism: Secondary | ICD-10-CM | POA: Diagnosis not present

## 2016-03-01 DIAGNOSIS — I739 Peripheral vascular disease, unspecified: Secondary | ICD-10-CM | POA: Diagnosis not present

## 2016-03-01 DIAGNOSIS — I5022 Chronic systolic (congestive) heart failure: Secondary | ICD-10-CM

## 2016-03-01 DIAGNOSIS — D649 Anemia, unspecified: Secondary | ICD-10-CM | POA: Diagnosis not present

## 2016-03-01 DIAGNOSIS — M542 Cervicalgia: Secondary | ICD-10-CM | POA: Diagnosis not present

## 2016-03-01 DIAGNOSIS — Z86711 Personal history of pulmonary embolism: Secondary | ICD-10-CM | POA: Diagnosis not present

## 2016-03-01 DIAGNOSIS — I2782 Chronic pulmonary embolism: Secondary | ICD-10-CM | POA: Diagnosis not present

## 2016-03-01 DIAGNOSIS — N179 Acute kidney failure, unspecified: Secondary | ICD-10-CM | POA: Diagnosis not present

## 2016-03-01 DIAGNOSIS — E1122 Type 2 diabetes mellitus with diabetic chronic kidney disease: Secondary | ICD-10-CM | POA: Diagnosis not present

## 2016-03-01 DIAGNOSIS — E860 Dehydration: Secondary | ICD-10-CM | POA: Diagnosis not present

## 2016-03-01 DIAGNOSIS — G8929 Other chronic pain: Secondary | ICD-10-CM | POA: Diagnosis not present

## 2016-03-01 DIAGNOSIS — S0990XA Unspecified injury of head, initial encounter: Secondary | ICD-10-CM | POA: Diagnosis not present

## 2016-03-01 DIAGNOSIS — S199XXA Unspecified injury of neck, initial encounter: Secondary | ICD-10-CM | POA: Diagnosis not present

## 2016-03-01 DIAGNOSIS — E1165 Type 2 diabetes mellitus with hyperglycemia: Secondary | ICD-10-CM | POA: Diagnosis not present

## 2016-03-01 DIAGNOSIS — E1151 Type 2 diabetes mellitus with diabetic peripheral angiopathy without gangrene: Secondary | ICD-10-CM | POA: Diagnosis not present

## 2016-03-01 DIAGNOSIS — N183 Chronic kidney disease, stage 3 (moderate): Secondary | ICD-10-CM | POA: Diagnosis not present

## 2016-03-01 DIAGNOSIS — L97919 Non-pressure chronic ulcer of unspecified part of right lower leg with unspecified severity: Secondary | ICD-10-CM | POA: Diagnosis not present

## 2016-03-01 DIAGNOSIS — R51 Headache: Secondary | ICD-10-CM | POA: Diagnosis not present

## 2016-03-01 DIAGNOSIS — I252 Old myocardial infarction: Secondary | ICD-10-CM | POA: Diagnosis not present

## 2016-03-01 DIAGNOSIS — I959 Hypotension, unspecified: Secondary | ICD-10-CM | POA: Diagnosis not present

## 2016-03-01 DIAGNOSIS — D696 Thrombocytopenia, unspecified: Secondary | ICD-10-CM | POA: Diagnosis not present

## 2016-03-01 DIAGNOSIS — I951 Orthostatic hypotension: Secondary | ICD-10-CM | POA: Diagnosis not present

## 2016-03-01 DIAGNOSIS — E872 Acidosis: Secondary | ICD-10-CM | POA: Diagnosis not present

## 2016-03-01 DIAGNOSIS — Z8709 Personal history of other diseases of the respiratory system: Secondary | ICD-10-CM | POA: Diagnosis not present

## 2016-03-01 DIAGNOSIS — I130 Hypertensive heart and chronic kidney disease with heart failure and stage 1 through stage 4 chronic kidney disease, or unspecified chronic kidney disease: Secondary | ICD-10-CM | POA: Diagnosis not present

## 2016-03-01 DIAGNOSIS — Z794 Long term (current) use of insulin: Secondary | ICD-10-CM | POA: Diagnosis not present

## 2016-03-02 DIAGNOSIS — I959 Hypotension, unspecified: Secondary | ICD-10-CM | POA: Diagnosis not present

## 2016-03-02 DIAGNOSIS — E1140 Type 2 diabetes mellitus with diabetic neuropathy, unspecified: Secondary | ICD-10-CM

## 2016-03-02 DIAGNOSIS — I5022 Chronic systolic (congestive) heart failure: Secondary | ICD-10-CM

## 2016-03-02 DIAGNOSIS — N179 Acute kidney failure, unspecified: Secondary | ICD-10-CM | POA: Diagnosis not present

## 2016-03-02 DIAGNOSIS — I739 Peripheral vascular disease, unspecified: Secondary | ICD-10-CM

## 2016-03-02 DIAGNOSIS — E1151 Type 2 diabetes mellitus with diabetic peripheral angiopathy without gangrene: Secondary | ICD-10-CM

## 2016-03-02 DIAGNOSIS — D696 Thrombocytopenia, unspecified: Secondary | ICD-10-CM

## 2016-03-02 DIAGNOSIS — R55 Syncope and collapse: Secondary | ICD-10-CM

## 2016-03-02 DIAGNOSIS — E1142 Type 2 diabetes mellitus with diabetic polyneuropathy: Secondary | ICD-10-CM

## 2016-03-02 DIAGNOSIS — Z86711 Personal history of pulmonary embolism: Secondary | ICD-10-CM

## 2016-03-02 DIAGNOSIS — E872 Acidosis: Secondary | ICD-10-CM | POA: Diagnosis not present

## 2016-03-02 DIAGNOSIS — E860 Dehydration: Secondary | ICD-10-CM | POA: Diagnosis not present

## 2016-03-06 DIAGNOSIS — I25118 Atherosclerotic heart disease of native coronary artery with other forms of angina pectoris: Secondary | ICD-10-CM | POA: Diagnosis not present

## 2016-03-06 DIAGNOSIS — Z794 Long term (current) use of insulin: Secondary | ICD-10-CM | POA: Diagnosis not present

## 2016-03-06 DIAGNOSIS — I5022 Chronic systolic (congestive) heart failure: Secondary | ICD-10-CM | POA: Diagnosis not present

## 2016-03-06 DIAGNOSIS — I130 Hypertensive heart and chronic kidney disease with heart failure and stage 1 through stage 4 chronic kidney disease, or unspecified chronic kidney disease: Secondary | ICD-10-CM | POA: Diagnosis not present

## 2016-03-06 DIAGNOSIS — E1140 Type 2 diabetes mellitus with diabetic neuropathy, unspecified: Secondary | ICD-10-CM | POA: Diagnosis not present

## 2016-03-06 DIAGNOSIS — Z7901 Long term (current) use of anticoagulants: Secondary | ICD-10-CM | POA: Diagnosis not present

## 2016-03-06 DIAGNOSIS — Z89511 Acquired absence of right leg below knee: Secondary | ICD-10-CM | POA: Diagnosis not present

## 2016-03-06 DIAGNOSIS — E1151 Type 2 diabetes mellitus with diabetic peripheral angiopathy without gangrene: Secondary | ICD-10-CM | POA: Diagnosis not present

## 2016-03-06 DIAGNOSIS — Z9181 History of falling: Secondary | ICD-10-CM | POA: Diagnosis not present

## 2016-03-06 DIAGNOSIS — S81812D Laceration without foreign body, left lower leg, subsequent encounter: Secondary | ICD-10-CM | POA: Diagnosis not present

## 2016-03-06 DIAGNOSIS — G894 Chronic pain syndrome: Secondary | ICD-10-CM | POA: Diagnosis not present

## 2016-03-06 DIAGNOSIS — E1122 Type 2 diabetes mellitus with diabetic chronic kidney disease: Secondary | ICD-10-CM | POA: Diagnosis not present

## 2016-03-06 DIAGNOSIS — N183 Chronic kidney disease, stage 3 (moderate): Secondary | ICD-10-CM | POA: Diagnosis not present

## 2016-03-08 DIAGNOSIS — E1140 Type 2 diabetes mellitus with diabetic neuropathy, unspecified: Secondary | ICD-10-CM | POA: Diagnosis not present

## 2016-03-08 DIAGNOSIS — Z9181 History of falling: Secondary | ICD-10-CM | POA: Diagnosis not present

## 2016-03-08 DIAGNOSIS — I25118 Atherosclerotic heart disease of native coronary artery with other forms of angina pectoris: Secondary | ICD-10-CM | POA: Diagnosis not present

## 2016-03-08 DIAGNOSIS — Z794 Long term (current) use of insulin: Secondary | ICD-10-CM | POA: Diagnosis not present

## 2016-03-08 DIAGNOSIS — I5022 Chronic systolic (congestive) heart failure: Secondary | ICD-10-CM | POA: Diagnosis not present

## 2016-03-08 DIAGNOSIS — N183 Chronic kidney disease, stage 3 (moderate): Secondary | ICD-10-CM | POA: Diagnosis not present

## 2016-03-08 DIAGNOSIS — Z89511 Acquired absence of right leg below knee: Secondary | ICD-10-CM | POA: Diagnosis not present

## 2016-03-08 DIAGNOSIS — E1151 Type 2 diabetes mellitus with diabetic peripheral angiopathy without gangrene: Secondary | ICD-10-CM | POA: Diagnosis not present

## 2016-03-08 DIAGNOSIS — Z7901 Long term (current) use of anticoagulants: Secondary | ICD-10-CM | POA: Diagnosis not present

## 2016-03-08 DIAGNOSIS — G894 Chronic pain syndrome: Secondary | ICD-10-CM | POA: Diagnosis not present

## 2016-03-08 DIAGNOSIS — S81812D Laceration without foreign body, left lower leg, subsequent encounter: Secondary | ICD-10-CM | POA: Diagnosis not present

## 2016-03-08 DIAGNOSIS — I130 Hypertensive heart and chronic kidney disease with heart failure and stage 1 through stage 4 chronic kidney disease, or unspecified chronic kidney disease: Secondary | ICD-10-CM | POA: Diagnosis not present

## 2016-03-08 DIAGNOSIS — E1122 Type 2 diabetes mellitus with diabetic chronic kidney disease: Secondary | ICD-10-CM | POA: Diagnosis not present

## 2016-03-12 DIAGNOSIS — R55 Syncope and collapse: Secondary | ICD-10-CM | POA: Diagnosis not present

## 2016-03-12 DIAGNOSIS — N189 Chronic kidney disease, unspecified: Secondary | ICD-10-CM | POA: Diagnosis not present

## 2016-03-12 DIAGNOSIS — A084 Viral intestinal infection, unspecified: Secondary | ICD-10-CM | POA: Diagnosis not present

## 2016-03-12 DIAGNOSIS — N179 Acute kidney failure, unspecified: Secondary | ICD-10-CM | POA: Diagnosis not present

## 2016-03-12 DIAGNOSIS — A419 Sepsis, unspecified organism: Secondary | ICD-10-CM | POA: Diagnosis not present

## 2016-03-13 DIAGNOSIS — G894 Chronic pain syndrome: Secondary | ICD-10-CM | POA: Diagnosis not present

## 2016-03-13 DIAGNOSIS — I25118 Atherosclerotic heart disease of native coronary artery with other forms of angina pectoris: Secondary | ICD-10-CM | POA: Diagnosis not present

## 2016-03-13 DIAGNOSIS — Z9181 History of falling: Secondary | ICD-10-CM | POA: Diagnosis not present

## 2016-03-13 DIAGNOSIS — N183 Chronic kidney disease, stage 3 (moderate): Secondary | ICD-10-CM | POA: Diagnosis not present

## 2016-03-13 DIAGNOSIS — I5022 Chronic systolic (congestive) heart failure: Secondary | ICD-10-CM | POA: Diagnosis not present

## 2016-03-13 DIAGNOSIS — E1140 Type 2 diabetes mellitus with diabetic neuropathy, unspecified: Secondary | ICD-10-CM | POA: Diagnosis not present

## 2016-03-13 DIAGNOSIS — E1151 Type 2 diabetes mellitus with diabetic peripheral angiopathy without gangrene: Secondary | ICD-10-CM | POA: Diagnosis not present

## 2016-03-13 DIAGNOSIS — Z89511 Acquired absence of right leg below knee: Secondary | ICD-10-CM | POA: Diagnosis not present

## 2016-03-13 DIAGNOSIS — Z794 Long term (current) use of insulin: Secondary | ICD-10-CM | POA: Diagnosis not present

## 2016-03-13 DIAGNOSIS — Z7901 Long term (current) use of anticoagulants: Secondary | ICD-10-CM | POA: Diagnosis not present

## 2016-03-13 DIAGNOSIS — E1122 Type 2 diabetes mellitus with diabetic chronic kidney disease: Secondary | ICD-10-CM | POA: Diagnosis not present

## 2016-03-13 DIAGNOSIS — S81812D Laceration without foreign body, left lower leg, subsequent encounter: Secondary | ICD-10-CM | POA: Diagnosis not present

## 2016-03-13 DIAGNOSIS — I130 Hypertensive heart and chronic kidney disease with heart failure and stage 1 through stage 4 chronic kidney disease, or unspecified chronic kidney disease: Secondary | ICD-10-CM | POA: Diagnosis not present

## 2016-03-15 DIAGNOSIS — Z89511 Acquired absence of right leg below knee: Secondary | ICD-10-CM | POA: Diagnosis not present

## 2016-03-15 DIAGNOSIS — N183 Chronic kidney disease, stage 3 (moderate): Secondary | ICD-10-CM | POA: Diagnosis not present

## 2016-03-15 DIAGNOSIS — E1122 Type 2 diabetes mellitus with diabetic chronic kidney disease: Secondary | ICD-10-CM | POA: Diagnosis not present

## 2016-03-15 DIAGNOSIS — I5022 Chronic systolic (congestive) heart failure: Secondary | ICD-10-CM | POA: Diagnosis not present

## 2016-03-15 DIAGNOSIS — I130 Hypertensive heart and chronic kidney disease with heart failure and stage 1 through stage 4 chronic kidney disease, or unspecified chronic kidney disease: Secondary | ICD-10-CM | POA: Diagnosis not present

## 2016-03-15 DIAGNOSIS — Z794 Long term (current) use of insulin: Secondary | ICD-10-CM | POA: Diagnosis not present

## 2016-03-15 DIAGNOSIS — E1151 Type 2 diabetes mellitus with diabetic peripheral angiopathy without gangrene: Secondary | ICD-10-CM | POA: Diagnosis not present

## 2016-03-15 DIAGNOSIS — S81812D Laceration without foreign body, left lower leg, subsequent encounter: Secondary | ICD-10-CM | POA: Diagnosis not present

## 2016-03-15 DIAGNOSIS — G894 Chronic pain syndrome: Secondary | ICD-10-CM | POA: Diagnosis not present

## 2016-03-15 DIAGNOSIS — Z9181 History of falling: Secondary | ICD-10-CM | POA: Diagnosis not present

## 2016-03-15 DIAGNOSIS — Z7901 Long term (current) use of anticoagulants: Secondary | ICD-10-CM | POA: Diagnosis not present

## 2016-03-15 DIAGNOSIS — E1140 Type 2 diabetes mellitus with diabetic neuropathy, unspecified: Secondary | ICD-10-CM | POA: Diagnosis not present

## 2016-03-15 DIAGNOSIS — I25118 Atherosclerotic heart disease of native coronary artery with other forms of angina pectoris: Secondary | ICD-10-CM | POA: Diagnosis not present

## 2016-03-21 DIAGNOSIS — S81812D Laceration without foreign body, left lower leg, subsequent encounter: Secondary | ICD-10-CM | POA: Diagnosis not present

## 2016-03-21 DIAGNOSIS — Z9181 History of falling: Secondary | ICD-10-CM | POA: Diagnosis not present

## 2016-03-21 DIAGNOSIS — Z794 Long term (current) use of insulin: Secondary | ICD-10-CM | POA: Diagnosis not present

## 2016-03-21 DIAGNOSIS — G894 Chronic pain syndrome: Secondary | ICD-10-CM | POA: Diagnosis not present

## 2016-03-21 DIAGNOSIS — I25118 Atherosclerotic heart disease of native coronary artery with other forms of angina pectoris: Secondary | ICD-10-CM | POA: Diagnosis not present

## 2016-03-21 DIAGNOSIS — Z89511 Acquired absence of right leg below knee: Secondary | ICD-10-CM | POA: Diagnosis not present

## 2016-03-21 DIAGNOSIS — N183 Chronic kidney disease, stage 3 (moderate): Secondary | ICD-10-CM | POA: Diagnosis not present

## 2016-03-21 DIAGNOSIS — E1122 Type 2 diabetes mellitus with diabetic chronic kidney disease: Secondary | ICD-10-CM | POA: Diagnosis not present

## 2016-03-21 DIAGNOSIS — E1151 Type 2 diabetes mellitus with diabetic peripheral angiopathy without gangrene: Secondary | ICD-10-CM | POA: Diagnosis not present

## 2016-03-21 DIAGNOSIS — I5022 Chronic systolic (congestive) heart failure: Secondary | ICD-10-CM | POA: Diagnosis not present

## 2016-03-21 DIAGNOSIS — E1140 Type 2 diabetes mellitus with diabetic neuropathy, unspecified: Secondary | ICD-10-CM | POA: Diagnosis not present

## 2016-03-21 DIAGNOSIS — Z7901 Long term (current) use of anticoagulants: Secondary | ICD-10-CM | POA: Diagnosis not present

## 2016-03-21 DIAGNOSIS — I130 Hypertensive heart and chronic kidney disease with heart failure and stage 1 through stage 4 chronic kidney disease, or unspecified chronic kidney disease: Secondary | ICD-10-CM | POA: Diagnosis not present

## 2016-03-28 DIAGNOSIS — G894 Chronic pain syndrome: Secondary | ICD-10-CM | POA: Diagnosis not present

## 2016-03-28 DIAGNOSIS — I5022 Chronic systolic (congestive) heart failure: Secondary | ICD-10-CM | POA: Diagnosis not present

## 2016-03-28 DIAGNOSIS — N183 Chronic kidney disease, stage 3 (moderate): Secondary | ICD-10-CM | POA: Diagnosis not present

## 2016-03-28 DIAGNOSIS — I25118 Atherosclerotic heart disease of native coronary artery with other forms of angina pectoris: Secondary | ICD-10-CM | POA: Diagnosis not present

## 2016-03-28 DIAGNOSIS — S81812D Laceration without foreign body, left lower leg, subsequent encounter: Secondary | ICD-10-CM | POA: Diagnosis not present

## 2016-03-28 DIAGNOSIS — Z9181 History of falling: Secondary | ICD-10-CM | POA: Diagnosis not present

## 2016-03-28 DIAGNOSIS — Z7901 Long term (current) use of anticoagulants: Secondary | ICD-10-CM | POA: Diagnosis not present

## 2016-03-28 DIAGNOSIS — E1151 Type 2 diabetes mellitus with diabetic peripheral angiopathy without gangrene: Secondary | ICD-10-CM | POA: Diagnosis not present

## 2016-03-28 DIAGNOSIS — E1122 Type 2 diabetes mellitus with diabetic chronic kidney disease: Secondary | ICD-10-CM | POA: Diagnosis not present

## 2016-03-28 DIAGNOSIS — I130 Hypertensive heart and chronic kidney disease with heart failure and stage 1 through stage 4 chronic kidney disease, or unspecified chronic kidney disease: Secondary | ICD-10-CM | POA: Diagnosis not present

## 2016-03-28 DIAGNOSIS — E1140 Type 2 diabetes mellitus with diabetic neuropathy, unspecified: Secondary | ICD-10-CM | POA: Diagnosis not present

## 2016-03-28 DIAGNOSIS — Z89511 Acquired absence of right leg below knee: Secondary | ICD-10-CM | POA: Diagnosis not present

## 2016-03-28 DIAGNOSIS — Z794 Long term (current) use of insulin: Secondary | ICD-10-CM | POA: Diagnosis not present

## 2016-04-02 DIAGNOSIS — I2782 Chronic pulmonary embolism: Secondary | ICD-10-CM | POA: Diagnosis not present

## 2016-04-03 DIAGNOSIS — I25118 Atherosclerotic heart disease of native coronary artery with other forms of angina pectoris: Secondary | ICD-10-CM | POA: Diagnosis not present

## 2016-04-03 DIAGNOSIS — G894 Chronic pain syndrome: Secondary | ICD-10-CM | POA: Diagnosis not present

## 2016-04-03 DIAGNOSIS — N183 Chronic kidney disease, stage 3 (moderate): Secondary | ICD-10-CM | POA: Diagnosis not present

## 2016-04-03 DIAGNOSIS — E1122 Type 2 diabetes mellitus with diabetic chronic kidney disease: Secondary | ICD-10-CM | POA: Diagnosis not present

## 2016-04-03 DIAGNOSIS — E1151 Type 2 diabetes mellitus with diabetic peripheral angiopathy without gangrene: Secondary | ICD-10-CM | POA: Diagnosis not present

## 2016-04-03 DIAGNOSIS — Z7901 Long term (current) use of anticoagulants: Secondary | ICD-10-CM | POA: Diagnosis not present

## 2016-04-03 DIAGNOSIS — Z794 Long term (current) use of insulin: Secondary | ICD-10-CM | POA: Diagnosis not present

## 2016-04-03 DIAGNOSIS — E1140 Type 2 diabetes mellitus with diabetic neuropathy, unspecified: Secondary | ICD-10-CM | POA: Diagnosis not present

## 2016-04-03 DIAGNOSIS — Z89511 Acquired absence of right leg below knee: Secondary | ICD-10-CM | POA: Diagnosis not present

## 2016-04-03 DIAGNOSIS — I5022 Chronic systolic (congestive) heart failure: Secondary | ICD-10-CM | POA: Diagnosis not present

## 2016-04-03 DIAGNOSIS — I130 Hypertensive heart and chronic kidney disease with heart failure and stage 1 through stage 4 chronic kidney disease, or unspecified chronic kidney disease: Secondary | ICD-10-CM | POA: Diagnosis not present

## 2016-04-03 DIAGNOSIS — Z9181 History of falling: Secondary | ICD-10-CM | POA: Diagnosis not present

## 2016-04-03 DIAGNOSIS — S81812D Laceration without foreign body, left lower leg, subsequent encounter: Secondary | ICD-10-CM | POA: Diagnosis not present

## 2016-04-12 DIAGNOSIS — Z89511 Acquired absence of right leg below knee: Secondary | ICD-10-CM | POA: Diagnosis not present

## 2016-04-12 DIAGNOSIS — S81812D Laceration without foreign body, left lower leg, subsequent encounter: Secondary | ICD-10-CM | POA: Diagnosis not present

## 2016-04-12 DIAGNOSIS — E1122 Type 2 diabetes mellitus with diabetic chronic kidney disease: Secondary | ICD-10-CM | POA: Diagnosis not present

## 2016-04-12 DIAGNOSIS — E1140 Type 2 diabetes mellitus with diabetic neuropathy, unspecified: Secondary | ICD-10-CM | POA: Diagnosis not present

## 2016-04-12 DIAGNOSIS — I5022 Chronic systolic (congestive) heart failure: Secondary | ICD-10-CM | POA: Diagnosis not present

## 2016-04-12 DIAGNOSIS — I130 Hypertensive heart and chronic kidney disease with heart failure and stage 1 through stage 4 chronic kidney disease, or unspecified chronic kidney disease: Secondary | ICD-10-CM | POA: Diagnosis not present

## 2016-04-12 DIAGNOSIS — Z794 Long term (current) use of insulin: Secondary | ICD-10-CM | POA: Diagnosis not present

## 2016-04-12 DIAGNOSIS — N183 Chronic kidney disease, stage 3 (moderate): Secondary | ICD-10-CM | POA: Diagnosis not present

## 2016-04-12 DIAGNOSIS — Z7901 Long term (current) use of anticoagulants: Secondary | ICD-10-CM | POA: Diagnosis not present

## 2016-04-12 DIAGNOSIS — I25118 Atherosclerotic heart disease of native coronary artery with other forms of angina pectoris: Secondary | ICD-10-CM | POA: Diagnosis not present

## 2016-04-12 DIAGNOSIS — G894 Chronic pain syndrome: Secondary | ICD-10-CM | POA: Diagnosis not present

## 2016-04-12 DIAGNOSIS — E1151 Type 2 diabetes mellitus with diabetic peripheral angiopathy without gangrene: Secondary | ICD-10-CM | POA: Diagnosis not present

## 2016-04-12 DIAGNOSIS — Z9181 History of falling: Secondary | ICD-10-CM | POA: Diagnosis not present

## 2016-04-16 DIAGNOSIS — I5032 Chronic diastolic (congestive) heart failure: Secondary | ICD-10-CM | POA: Diagnosis not present

## 2016-04-16 DIAGNOSIS — I2510 Atherosclerotic heart disease of native coronary artery without angina pectoris: Secondary | ICD-10-CM | POA: Diagnosis not present

## 2016-04-16 DIAGNOSIS — E1122 Type 2 diabetes mellitus with diabetic chronic kidney disease: Secondary | ICD-10-CM | POA: Diagnosis not present

## 2016-04-16 DIAGNOSIS — E1069 Type 1 diabetes mellitus with other specified complication: Secondary | ICD-10-CM | POA: Diagnosis not present

## 2016-04-16 DIAGNOSIS — Z951 Presence of aortocoronary bypass graft: Secondary | ICD-10-CM | POA: Diagnosis not present

## 2016-04-19 DIAGNOSIS — E1140 Type 2 diabetes mellitus with diabetic neuropathy, unspecified: Secondary | ICD-10-CM | POA: Diagnosis not present

## 2016-04-19 DIAGNOSIS — I130 Hypertensive heart and chronic kidney disease with heart failure and stage 1 through stage 4 chronic kidney disease, or unspecified chronic kidney disease: Secondary | ICD-10-CM | POA: Diagnosis not present

## 2016-04-19 DIAGNOSIS — Z7901 Long term (current) use of anticoagulants: Secondary | ICD-10-CM | POA: Diagnosis not present

## 2016-04-19 DIAGNOSIS — I5022 Chronic systolic (congestive) heart failure: Secondary | ICD-10-CM | POA: Diagnosis not present

## 2016-04-19 DIAGNOSIS — E1122 Type 2 diabetes mellitus with diabetic chronic kidney disease: Secondary | ICD-10-CM | POA: Diagnosis not present

## 2016-04-19 DIAGNOSIS — E1151 Type 2 diabetes mellitus with diabetic peripheral angiopathy without gangrene: Secondary | ICD-10-CM | POA: Diagnosis not present

## 2016-04-19 DIAGNOSIS — Z9181 History of falling: Secondary | ICD-10-CM | POA: Diagnosis not present

## 2016-04-19 DIAGNOSIS — Z89511 Acquired absence of right leg below knee: Secondary | ICD-10-CM | POA: Diagnosis not present

## 2016-04-19 DIAGNOSIS — N183 Chronic kidney disease, stage 3 (moderate): Secondary | ICD-10-CM | POA: Diagnosis not present

## 2016-04-19 DIAGNOSIS — S81812D Laceration without foreign body, left lower leg, subsequent encounter: Secondary | ICD-10-CM | POA: Diagnosis not present

## 2016-04-19 DIAGNOSIS — E1165 Type 2 diabetes mellitus with hyperglycemia: Secondary | ICD-10-CM | POA: Diagnosis not present

## 2016-04-19 DIAGNOSIS — G894 Chronic pain syndrome: Secondary | ICD-10-CM | POA: Diagnosis not present

## 2016-04-19 DIAGNOSIS — I25810 Atherosclerosis of coronary artery bypass graft(s) without angina pectoris: Secondary | ICD-10-CM | POA: Diagnosis not present

## 2016-04-19 DIAGNOSIS — Z794 Long term (current) use of insulin: Secondary | ICD-10-CM | POA: Diagnosis not present

## 2016-04-19 DIAGNOSIS — I25118 Atherosclerotic heart disease of native coronary artery with other forms of angina pectoris: Secondary | ICD-10-CM | POA: Diagnosis not present

## 2016-04-26 DIAGNOSIS — E1151 Type 2 diabetes mellitus with diabetic peripheral angiopathy without gangrene: Secondary | ICD-10-CM | POA: Diagnosis not present

## 2016-04-26 DIAGNOSIS — I5022 Chronic systolic (congestive) heart failure: Secondary | ICD-10-CM | POA: Diagnosis not present

## 2016-04-26 DIAGNOSIS — E1122 Type 2 diabetes mellitus with diabetic chronic kidney disease: Secondary | ICD-10-CM | POA: Diagnosis not present

## 2016-04-26 DIAGNOSIS — Z89511 Acquired absence of right leg below knee: Secondary | ICD-10-CM | POA: Diagnosis not present

## 2016-04-26 DIAGNOSIS — E1140 Type 2 diabetes mellitus with diabetic neuropathy, unspecified: Secondary | ICD-10-CM | POA: Diagnosis not present

## 2016-04-26 DIAGNOSIS — S81812D Laceration without foreign body, left lower leg, subsequent encounter: Secondary | ICD-10-CM | POA: Diagnosis not present

## 2016-04-26 DIAGNOSIS — I25118 Atherosclerotic heart disease of native coronary artery with other forms of angina pectoris: Secondary | ICD-10-CM | POA: Diagnosis not present

## 2016-04-26 DIAGNOSIS — N183 Chronic kidney disease, stage 3 (moderate): Secondary | ICD-10-CM | POA: Diagnosis not present

## 2016-04-26 DIAGNOSIS — I130 Hypertensive heart and chronic kidney disease with heart failure and stage 1 through stage 4 chronic kidney disease, or unspecified chronic kidney disease: Secondary | ICD-10-CM | POA: Diagnosis not present

## 2016-04-26 DIAGNOSIS — Z9181 History of falling: Secondary | ICD-10-CM | POA: Diagnosis not present

## 2016-04-26 DIAGNOSIS — Z7901 Long term (current) use of anticoagulants: Secondary | ICD-10-CM | POA: Diagnosis not present

## 2016-04-26 DIAGNOSIS — Z794 Long term (current) use of insulin: Secondary | ICD-10-CM | POA: Diagnosis not present

## 2016-04-26 DIAGNOSIS — G894 Chronic pain syndrome: Secondary | ICD-10-CM | POA: Diagnosis not present

## 2016-05-03 DIAGNOSIS — I2782 Chronic pulmonary embolism: Secondary | ICD-10-CM | POA: Diagnosis not present

## 2016-05-09 DIAGNOSIS — Z23 Encounter for immunization: Secondary | ICD-10-CM | POA: Diagnosis not present

## 2016-05-09 DIAGNOSIS — E11621 Type 2 diabetes mellitus with foot ulcer: Secondary | ICD-10-CM | POA: Diagnosis not present

## 2016-05-09 DIAGNOSIS — L97421 Non-pressure chronic ulcer of left heel and midfoot limited to breakdown of skin: Secondary | ICD-10-CM | POA: Diagnosis not present

## 2016-05-09 DIAGNOSIS — E1165 Type 2 diabetes mellitus with hyperglycemia: Secondary | ICD-10-CM | POA: Diagnosis not present

## 2016-05-09 DIAGNOSIS — G629 Polyneuropathy, unspecified: Secondary | ICD-10-CM | POA: Diagnosis not present

## 2016-05-12 DIAGNOSIS — Z89511 Acquired absence of right leg below knee: Secondary | ICD-10-CM | POA: Diagnosis not present

## 2016-05-12 DIAGNOSIS — E11621 Type 2 diabetes mellitus with foot ulcer: Secondary | ICD-10-CM | POA: Diagnosis not present

## 2016-05-12 DIAGNOSIS — E1122 Type 2 diabetes mellitus with diabetic chronic kidney disease: Secondary | ICD-10-CM | POA: Diagnosis not present

## 2016-05-12 DIAGNOSIS — N183 Chronic kidney disease, stage 3 (moderate): Secondary | ICD-10-CM | POA: Diagnosis not present

## 2016-05-12 DIAGNOSIS — E1142 Type 2 diabetes mellitus with diabetic polyneuropathy: Secondary | ICD-10-CM | POA: Diagnosis not present

## 2016-05-12 DIAGNOSIS — G894 Chronic pain syndrome: Secondary | ICD-10-CM | POA: Diagnosis not present

## 2016-05-12 DIAGNOSIS — E1151 Type 2 diabetes mellitus with diabetic peripheral angiopathy without gangrene: Secondary | ICD-10-CM | POA: Diagnosis not present

## 2016-05-12 DIAGNOSIS — I130 Hypertensive heart and chronic kidney disease with heart failure and stage 1 through stage 4 chronic kidney disease, or unspecified chronic kidney disease: Secondary | ICD-10-CM | POA: Diagnosis not present

## 2016-05-12 DIAGNOSIS — I25118 Atherosclerotic heart disease of native coronary artery with other forms of angina pectoris: Secondary | ICD-10-CM | POA: Diagnosis not present

## 2016-05-12 DIAGNOSIS — I5022 Chronic systolic (congestive) heart failure: Secondary | ICD-10-CM | POA: Diagnosis not present

## 2016-05-12 DIAGNOSIS — L97421 Non-pressure chronic ulcer of left heel and midfoot limited to breakdown of skin: Secondary | ICD-10-CM | POA: Diagnosis not present

## 2016-05-12 DIAGNOSIS — Z7901 Long term (current) use of anticoagulants: Secondary | ICD-10-CM | POA: Diagnosis not present

## 2016-05-12 DIAGNOSIS — E1165 Type 2 diabetes mellitus with hyperglycemia: Secondary | ICD-10-CM | POA: Diagnosis not present

## 2016-05-12 DIAGNOSIS — Z86711 Personal history of pulmonary embolism: Secondary | ICD-10-CM | POA: Diagnosis not present

## 2016-05-14 DIAGNOSIS — E1142 Type 2 diabetes mellitus with diabetic polyneuropathy: Secondary | ICD-10-CM | POA: Diagnosis not present

## 2016-05-14 DIAGNOSIS — I25118 Atherosclerotic heart disease of native coronary artery with other forms of angina pectoris: Secondary | ICD-10-CM | POA: Diagnosis not present

## 2016-05-14 DIAGNOSIS — N183 Chronic kidney disease, stage 3 (moderate): Secondary | ICD-10-CM | POA: Diagnosis not present

## 2016-05-14 DIAGNOSIS — L97421 Non-pressure chronic ulcer of left heel and midfoot limited to breakdown of skin: Secondary | ICD-10-CM | POA: Diagnosis not present

## 2016-05-14 DIAGNOSIS — Z7901 Long term (current) use of anticoagulants: Secondary | ICD-10-CM | POA: Diagnosis not present

## 2016-05-14 DIAGNOSIS — E11621 Type 2 diabetes mellitus with foot ulcer: Secondary | ICD-10-CM | POA: Diagnosis not present

## 2016-05-14 DIAGNOSIS — Z86711 Personal history of pulmonary embolism: Secondary | ICD-10-CM | POA: Diagnosis not present

## 2016-05-14 DIAGNOSIS — I5022 Chronic systolic (congestive) heart failure: Secondary | ICD-10-CM | POA: Diagnosis not present

## 2016-05-14 DIAGNOSIS — E1122 Type 2 diabetes mellitus with diabetic chronic kidney disease: Secondary | ICD-10-CM | POA: Diagnosis not present

## 2016-05-14 DIAGNOSIS — Z89511 Acquired absence of right leg below knee: Secondary | ICD-10-CM | POA: Diagnosis not present

## 2016-05-14 DIAGNOSIS — E1165 Type 2 diabetes mellitus with hyperglycemia: Secondary | ICD-10-CM | POA: Diagnosis not present

## 2016-05-14 DIAGNOSIS — G894 Chronic pain syndrome: Secondary | ICD-10-CM | POA: Diagnosis not present

## 2016-05-14 DIAGNOSIS — E1151 Type 2 diabetes mellitus with diabetic peripheral angiopathy without gangrene: Secondary | ICD-10-CM | POA: Diagnosis not present

## 2016-05-14 DIAGNOSIS — I130 Hypertensive heart and chronic kidney disease with heart failure and stage 1 through stage 4 chronic kidney disease, or unspecified chronic kidney disease: Secondary | ICD-10-CM | POA: Diagnosis not present

## 2016-05-17 DIAGNOSIS — Z87891 Personal history of nicotine dependence: Secondary | ICD-10-CM | POA: Diagnosis not present

## 2016-05-17 DIAGNOSIS — E11621 Type 2 diabetes mellitus with foot ulcer: Secondary | ICD-10-CM | POA: Diagnosis not present

## 2016-05-17 DIAGNOSIS — I2510 Atherosclerotic heart disease of native coronary artery without angina pectoris: Secondary | ICD-10-CM | POA: Diagnosis not present

## 2016-05-17 DIAGNOSIS — G4730 Sleep apnea, unspecified: Secondary | ICD-10-CM | POA: Diagnosis not present

## 2016-05-17 DIAGNOSIS — I509 Heart failure, unspecified: Secondary | ICD-10-CM | POA: Diagnosis not present

## 2016-05-17 DIAGNOSIS — L97422 Non-pressure chronic ulcer of left heel and midfoot with fat layer exposed: Secondary | ICD-10-CM | POA: Diagnosis not present

## 2016-05-17 DIAGNOSIS — Z89511 Acquired absence of right leg below knee: Secondary | ICD-10-CM | POA: Diagnosis not present

## 2016-05-17 DIAGNOSIS — I110 Hypertensive heart disease with heart failure: Secondary | ICD-10-CM | POA: Diagnosis not present

## 2016-05-17 DIAGNOSIS — M109 Gout, unspecified: Secondary | ICD-10-CM | POA: Diagnosis not present

## 2016-05-17 DIAGNOSIS — Z794 Long term (current) use of insulin: Secondary | ICD-10-CM | POA: Diagnosis not present

## 2016-05-17 DIAGNOSIS — I252 Old myocardial infarction: Secondary | ICD-10-CM | POA: Diagnosis not present

## 2016-05-18 DIAGNOSIS — Z89511 Acquired absence of right leg below knee: Secondary | ICD-10-CM | POA: Diagnosis not present

## 2016-05-18 DIAGNOSIS — N183 Chronic kidney disease, stage 3 (moderate): Secondary | ICD-10-CM | POA: Diagnosis not present

## 2016-05-18 DIAGNOSIS — E1151 Type 2 diabetes mellitus with diabetic peripheral angiopathy without gangrene: Secondary | ICD-10-CM | POA: Diagnosis not present

## 2016-05-18 DIAGNOSIS — E1122 Type 2 diabetes mellitus with diabetic chronic kidney disease: Secondary | ICD-10-CM | POA: Diagnosis not present

## 2016-05-18 DIAGNOSIS — Z86711 Personal history of pulmonary embolism: Secondary | ICD-10-CM | POA: Diagnosis not present

## 2016-05-18 DIAGNOSIS — I130 Hypertensive heart and chronic kidney disease with heart failure and stage 1 through stage 4 chronic kidney disease, or unspecified chronic kidney disease: Secondary | ICD-10-CM | POA: Diagnosis not present

## 2016-05-18 DIAGNOSIS — E11621 Type 2 diabetes mellitus with foot ulcer: Secondary | ICD-10-CM | POA: Diagnosis not present

## 2016-05-18 DIAGNOSIS — G894 Chronic pain syndrome: Secondary | ICD-10-CM | POA: Diagnosis not present

## 2016-05-18 DIAGNOSIS — L97421 Non-pressure chronic ulcer of left heel and midfoot limited to breakdown of skin: Secondary | ICD-10-CM | POA: Diagnosis not present

## 2016-05-18 DIAGNOSIS — E1165 Type 2 diabetes mellitus with hyperglycemia: Secondary | ICD-10-CM | POA: Diagnosis not present

## 2016-05-18 DIAGNOSIS — E1142 Type 2 diabetes mellitus with diabetic polyneuropathy: Secondary | ICD-10-CM | POA: Diagnosis not present

## 2016-05-18 DIAGNOSIS — Z7901 Long term (current) use of anticoagulants: Secondary | ICD-10-CM | POA: Diagnosis not present

## 2016-05-18 DIAGNOSIS — I25118 Atherosclerotic heart disease of native coronary artery with other forms of angina pectoris: Secondary | ICD-10-CM | POA: Diagnosis not present

## 2016-05-18 DIAGNOSIS — I5022 Chronic systolic (congestive) heart failure: Secondary | ICD-10-CM | POA: Diagnosis not present

## 2016-05-21 DIAGNOSIS — Z86711 Personal history of pulmonary embolism: Secondary | ICD-10-CM | POA: Diagnosis not present

## 2016-05-21 DIAGNOSIS — E1122 Type 2 diabetes mellitus with diabetic chronic kidney disease: Secondary | ICD-10-CM | POA: Diagnosis not present

## 2016-05-21 DIAGNOSIS — I25118 Atherosclerotic heart disease of native coronary artery with other forms of angina pectoris: Secondary | ICD-10-CM | POA: Diagnosis not present

## 2016-05-21 DIAGNOSIS — G894 Chronic pain syndrome: Secondary | ICD-10-CM | POA: Diagnosis not present

## 2016-05-21 DIAGNOSIS — E1151 Type 2 diabetes mellitus with diabetic peripheral angiopathy without gangrene: Secondary | ICD-10-CM | POA: Diagnosis not present

## 2016-05-21 DIAGNOSIS — Z89511 Acquired absence of right leg below knee: Secondary | ICD-10-CM | POA: Diagnosis not present

## 2016-05-21 DIAGNOSIS — Z7901 Long term (current) use of anticoagulants: Secondary | ICD-10-CM | POA: Diagnosis not present

## 2016-05-21 DIAGNOSIS — E1165 Type 2 diabetes mellitus with hyperglycemia: Secondary | ICD-10-CM | POA: Diagnosis not present

## 2016-05-21 DIAGNOSIS — I5022 Chronic systolic (congestive) heart failure: Secondary | ICD-10-CM | POA: Diagnosis not present

## 2016-05-21 DIAGNOSIS — E11621 Type 2 diabetes mellitus with foot ulcer: Secondary | ICD-10-CM | POA: Diagnosis not present

## 2016-05-21 DIAGNOSIS — N183 Chronic kidney disease, stage 3 (moderate): Secondary | ICD-10-CM | POA: Diagnosis not present

## 2016-05-21 DIAGNOSIS — L97421 Non-pressure chronic ulcer of left heel and midfoot limited to breakdown of skin: Secondary | ICD-10-CM | POA: Diagnosis not present

## 2016-05-21 DIAGNOSIS — E1142 Type 2 diabetes mellitus with diabetic polyneuropathy: Secondary | ICD-10-CM | POA: Diagnosis not present

## 2016-05-21 DIAGNOSIS — I130 Hypertensive heart and chronic kidney disease with heart failure and stage 1 through stage 4 chronic kidney disease, or unspecified chronic kidney disease: Secondary | ICD-10-CM | POA: Diagnosis not present

## 2016-05-24 DIAGNOSIS — E1165 Type 2 diabetes mellitus with hyperglycemia: Secondary | ICD-10-CM | POA: Diagnosis not present

## 2016-05-24 DIAGNOSIS — I130 Hypertensive heart and chronic kidney disease with heart failure and stage 1 through stage 4 chronic kidney disease, or unspecified chronic kidney disease: Secondary | ICD-10-CM | POA: Diagnosis not present

## 2016-05-24 DIAGNOSIS — E11621 Type 2 diabetes mellitus with foot ulcer: Secondary | ICD-10-CM | POA: Diagnosis not present

## 2016-05-24 DIAGNOSIS — I5022 Chronic systolic (congestive) heart failure: Secondary | ICD-10-CM | POA: Diagnosis not present

## 2016-05-24 DIAGNOSIS — G894 Chronic pain syndrome: Secondary | ICD-10-CM | POA: Diagnosis not present

## 2016-05-24 DIAGNOSIS — Z7901 Long term (current) use of anticoagulants: Secondary | ICD-10-CM | POA: Diagnosis not present

## 2016-05-24 DIAGNOSIS — E1122 Type 2 diabetes mellitus with diabetic chronic kidney disease: Secondary | ICD-10-CM | POA: Diagnosis not present

## 2016-05-24 DIAGNOSIS — E1151 Type 2 diabetes mellitus with diabetic peripheral angiopathy without gangrene: Secondary | ICD-10-CM | POA: Diagnosis not present

## 2016-05-24 DIAGNOSIS — Z89511 Acquired absence of right leg below knee: Secondary | ICD-10-CM | POA: Diagnosis not present

## 2016-05-24 DIAGNOSIS — Z86711 Personal history of pulmonary embolism: Secondary | ICD-10-CM | POA: Diagnosis not present

## 2016-05-24 DIAGNOSIS — L97421 Non-pressure chronic ulcer of left heel and midfoot limited to breakdown of skin: Secondary | ICD-10-CM | POA: Diagnosis not present

## 2016-05-24 DIAGNOSIS — E1142 Type 2 diabetes mellitus with diabetic polyneuropathy: Secondary | ICD-10-CM | POA: Diagnosis not present

## 2016-05-24 DIAGNOSIS — N183 Chronic kidney disease, stage 3 (moderate): Secondary | ICD-10-CM | POA: Diagnosis not present

## 2016-05-24 DIAGNOSIS — I25118 Atherosclerotic heart disease of native coronary artery with other forms of angina pectoris: Secondary | ICD-10-CM | POA: Diagnosis not present

## 2016-05-25 DIAGNOSIS — L97521 Non-pressure chronic ulcer of other part of left foot limited to breakdown of skin: Secondary | ICD-10-CM | POA: Diagnosis not present

## 2016-05-25 DIAGNOSIS — L97421 Non-pressure chronic ulcer of left heel and midfoot limited to breakdown of skin: Secondary | ICD-10-CM | POA: Diagnosis not present

## 2016-05-25 DIAGNOSIS — E11621 Type 2 diabetes mellitus with foot ulcer: Secondary | ICD-10-CM | POA: Diagnosis not present

## 2016-05-28 DIAGNOSIS — E1142 Type 2 diabetes mellitus with diabetic polyneuropathy: Secondary | ICD-10-CM | POA: Diagnosis not present

## 2016-05-28 DIAGNOSIS — N183 Chronic kidney disease, stage 3 (moderate): Secondary | ICD-10-CM | POA: Diagnosis not present

## 2016-05-28 DIAGNOSIS — E11621 Type 2 diabetes mellitus with foot ulcer: Secondary | ICD-10-CM | POA: Diagnosis not present

## 2016-05-28 DIAGNOSIS — E1165 Type 2 diabetes mellitus with hyperglycemia: Secondary | ICD-10-CM | POA: Diagnosis not present

## 2016-05-28 DIAGNOSIS — Z86711 Personal history of pulmonary embolism: Secondary | ICD-10-CM | POA: Diagnosis not present

## 2016-05-28 DIAGNOSIS — I5022 Chronic systolic (congestive) heart failure: Secondary | ICD-10-CM | POA: Diagnosis not present

## 2016-05-28 DIAGNOSIS — E1122 Type 2 diabetes mellitus with diabetic chronic kidney disease: Secondary | ICD-10-CM | POA: Diagnosis not present

## 2016-05-28 DIAGNOSIS — I25118 Atherosclerotic heart disease of native coronary artery with other forms of angina pectoris: Secondary | ICD-10-CM | POA: Diagnosis not present

## 2016-05-28 DIAGNOSIS — L97421 Non-pressure chronic ulcer of left heel and midfoot limited to breakdown of skin: Secondary | ICD-10-CM | POA: Diagnosis not present

## 2016-05-28 DIAGNOSIS — I130 Hypertensive heart and chronic kidney disease with heart failure and stage 1 through stage 4 chronic kidney disease, or unspecified chronic kidney disease: Secondary | ICD-10-CM | POA: Diagnosis not present

## 2016-05-28 DIAGNOSIS — G894 Chronic pain syndrome: Secondary | ICD-10-CM | POA: Diagnosis not present

## 2016-05-28 DIAGNOSIS — Z7901 Long term (current) use of anticoagulants: Secondary | ICD-10-CM | POA: Diagnosis not present

## 2016-05-28 DIAGNOSIS — Z89511 Acquired absence of right leg below knee: Secondary | ICD-10-CM | POA: Diagnosis not present

## 2016-05-28 DIAGNOSIS — E1151 Type 2 diabetes mellitus with diabetic peripheral angiopathy without gangrene: Secondary | ICD-10-CM | POA: Diagnosis not present

## 2016-05-28 IMAGING — MR MR HEAD W/O CM
8 of 10 series · 38 of 48 positions shown · non-contrast
Comparison: CT head 07/30/2015

CLINICAL DATA: Wound infection.  Diabetes.  Recent falls.

EXAM:
MRI HEAD WITHOUT CONTRAST
TECHNIQUE: Multiplanar, multiecho pulse sequences of the brain and surrounding
structures were obtained without intravenous contrast.

[Series 3: DWI · axial · 3.0mm · 1.09mm/px · z∈[-31,+112]mm · 9 of 98 slices shown (1 of 4)]
[im 1/98]
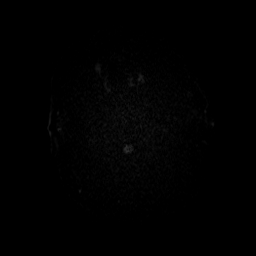
[im 17/98]
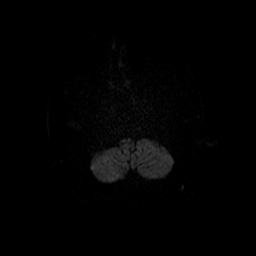
[im 33/98]
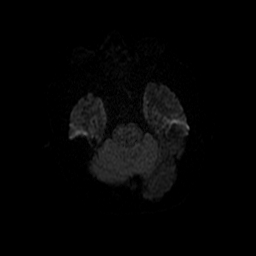
[im 41/98]
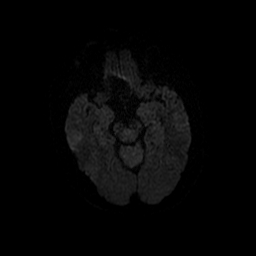
[im 49/98]
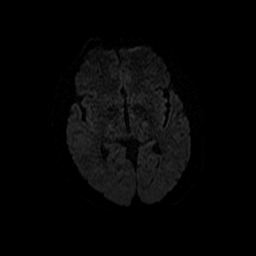
[im 57/98]
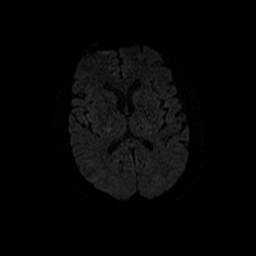
[im 65/98]
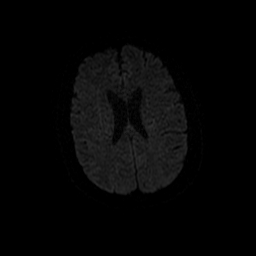
[im 81/98]
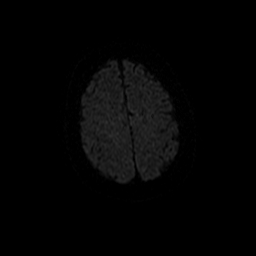
[im 98/98]
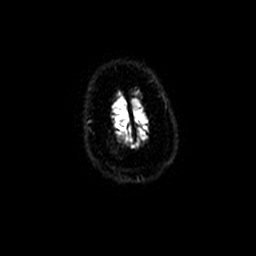

[Series 4: T2 · axial · 5.0mm · 0.43mm/px · z∈[-33,+105]mm · 3 of 24 slices shown]
[im 1/24]
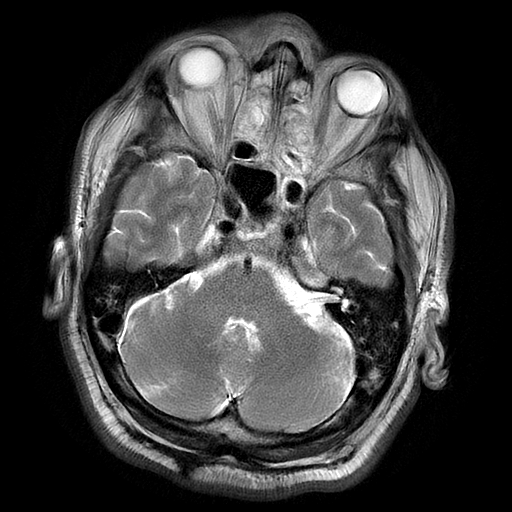
[im 12/24]
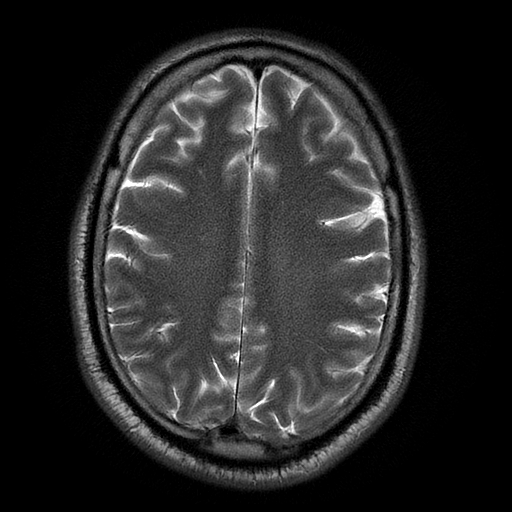
[im 24/24]
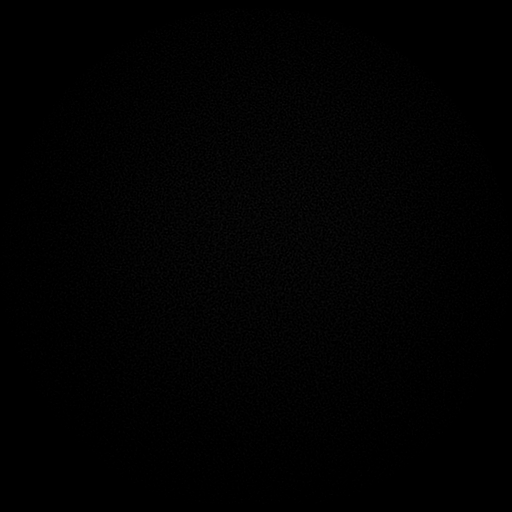

[Series 6: FLAIR · axial · 5.0mm · 0.43mm/px · z∈[-78,+59]mm · 3 of 24 slices shown (1 of 3)]
[im 1/24]
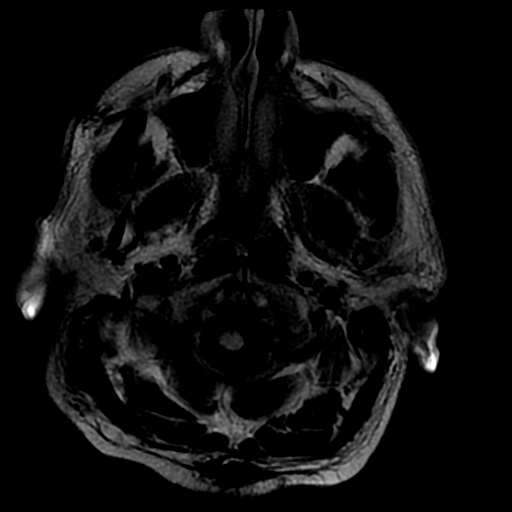
[im 12/24]
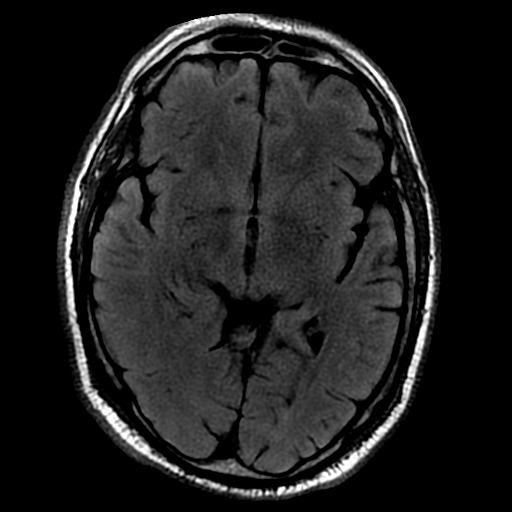
[im 24/24]
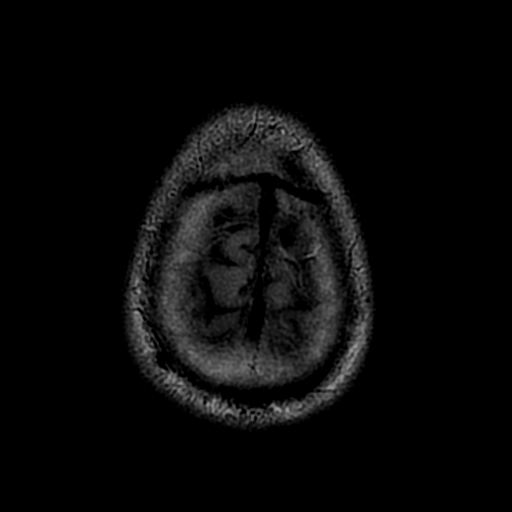

[Series 10: FLAIR · axial · 5.0mm · 0.43mm/px · z∈[-83,+54]mm · 3 of 24 slices shown (2 of 3)]
[im 1/24]
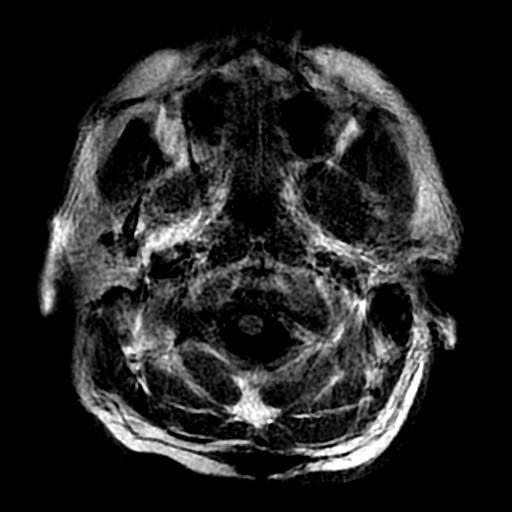
[im 12/24]
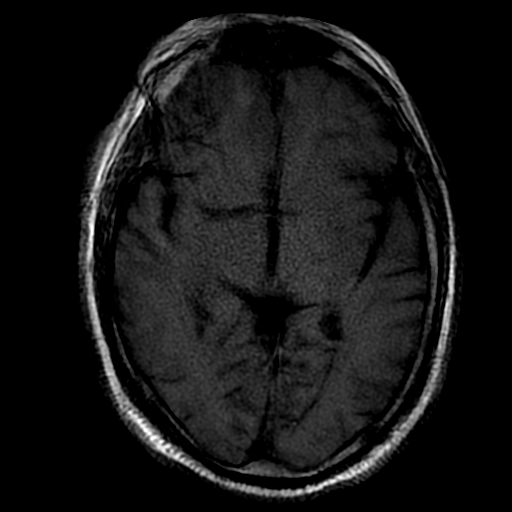
[im 24/24]
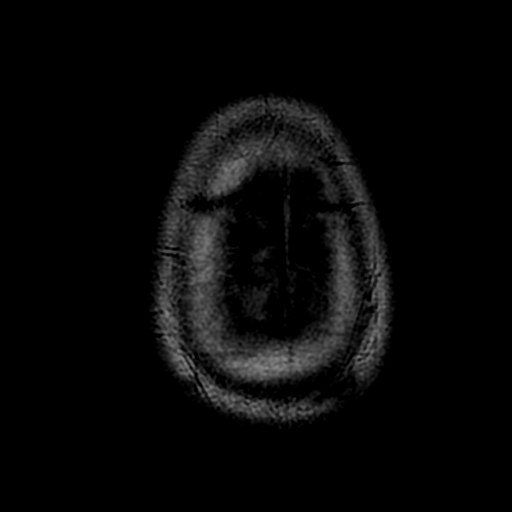

[Series 11: DWI · coronal · 5.0mm · 1.09mm/px · 8 of 66 slices shown (2 of 4)]
[im 1/66]
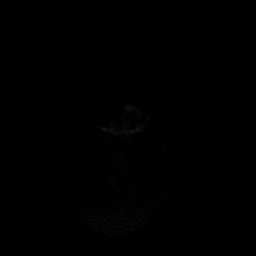
[im 10/66]
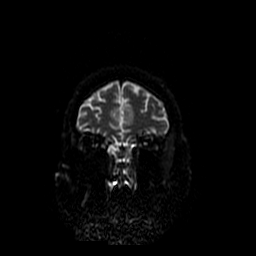
[im 19/66]
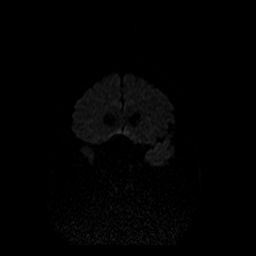
[im 28/66]
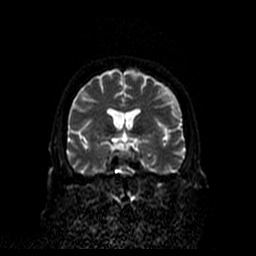
[im 38/66]
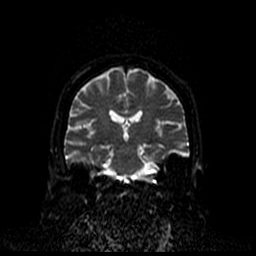
[im 47/66]
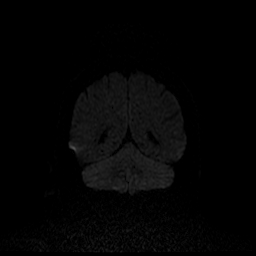
[im 56/66]
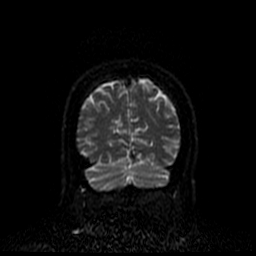
[im 66/66]
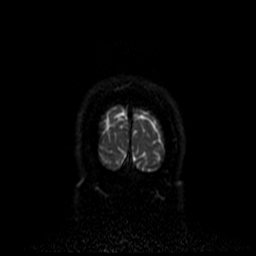

[Series 12: FLAIR · sagittal · 5.0mm · 0.47mm/px · 2 of 20 slices shown (3 of 3)]
[im 1/20]
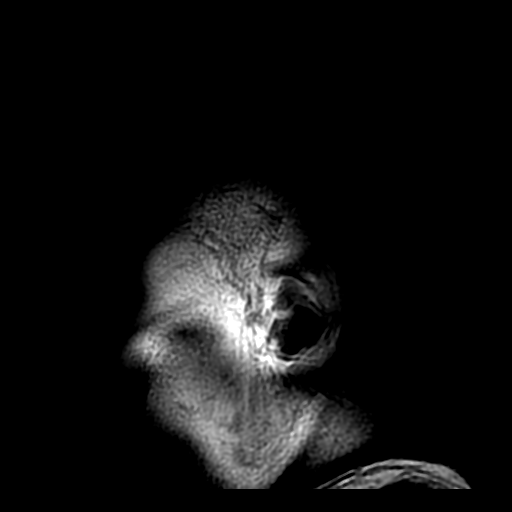
[im 20/20]
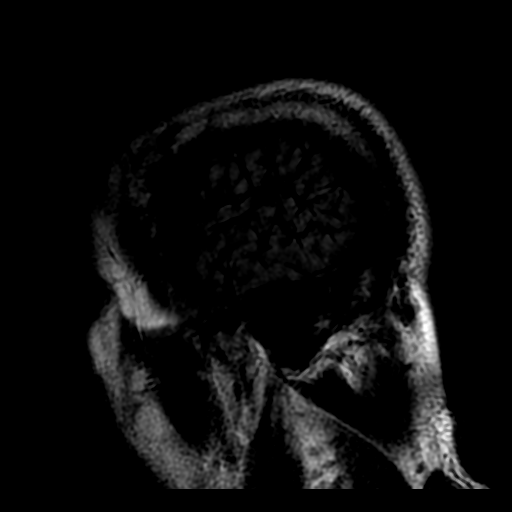

[Series 300: DWI · axial · 3.0mm · 1.09mm/px · z∈[-31,+112]mm · 6 of 49 slices shown (3 of 4)]
[im 1/49]
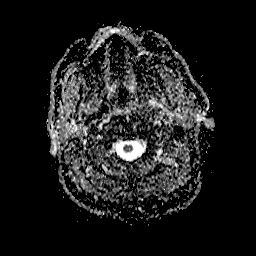
[im 10/49]
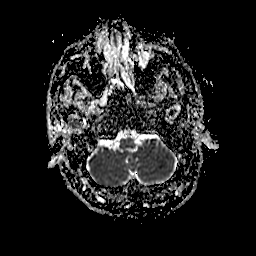
[im 20/49]
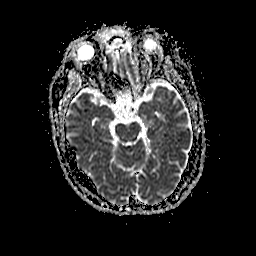
[im 29/49]
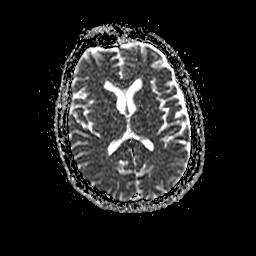
[im 39/49]
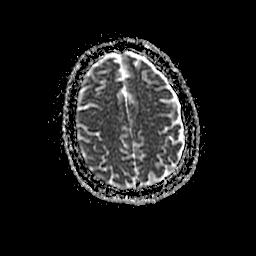
[im 49/49]
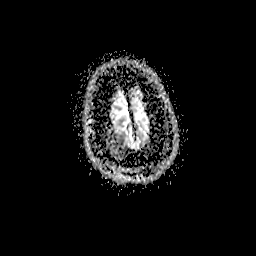

[Series 1100: DWI · coronal · 5.0mm · 1.09mm/px · 4 of 33 slices shown (4 of 4)]
[im 1/33]
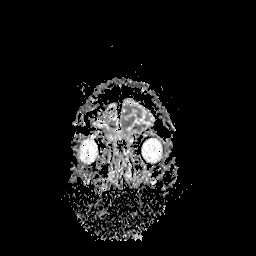
[im 11/33]
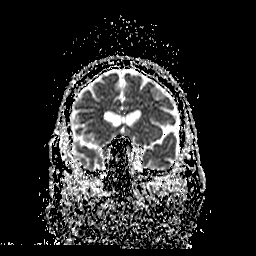
[im 22/33]
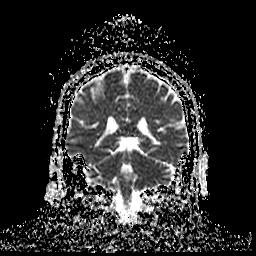
[im 33/33]
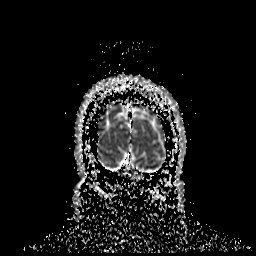

[38 of 48 positions shown; findings below may reference images not displayed]

FINDINGS: Image quality degraded by motion.

Ventricle size normal.  Cerebral volume normal.

Negative for acute infarction.  No significant chronic ischemia.

Negative for hemorrhage or fluid collection

Negative for mass or edema.  No shift of the midline structures.

Extensive mucosal edema in the paranasal sinuses bilaterally without
air-fluid level. Pituitary normal in size.
IMPRESSION: Image quality degraded by motion. Allowing for this, no significant
intracranial abnormality

Extensive mucosal edema throughout the paranasal sinuses compatible
with chronic sinusitis.

## 2016-06-01 DIAGNOSIS — E11621 Type 2 diabetes mellitus with foot ulcer: Secondary | ICD-10-CM | POA: Diagnosis not present

## 2016-06-01 DIAGNOSIS — L97422 Non-pressure chronic ulcer of left heel and midfoot with fat layer exposed: Secondary | ICD-10-CM | POA: Diagnosis not present

## 2016-06-01 DIAGNOSIS — L97521 Non-pressure chronic ulcer of other part of left foot limited to breakdown of skin: Secondary | ICD-10-CM | POA: Diagnosis not present

## 2016-06-02 DIAGNOSIS — I2782 Chronic pulmonary embolism: Secondary | ICD-10-CM | POA: Diagnosis not present

## 2016-06-04 DIAGNOSIS — E1151 Type 2 diabetes mellitus with diabetic peripheral angiopathy without gangrene: Secondary | ICD-10-CM | POA: Diagnosis not present

## 2016-06-04 DIAGNOSIS — E11621 Type 2 diabetes mellitus with foot ulcer: Secondary | ICD-10-CM | POA: Diagnosis not present

## 2016-06-04 DIAGNOSIS — Z89511 Acquired absence of right leg below knee: Secondary | ICD-10-CM | POA: Diagnosis not present

## 2016-06-04 DIAGNOSIS — L97421 Non-pressure chronic ulcer of left heel and midfoot limited to breakdown of skin: Secondary | ICD-10-CM | POA: Diagnosis not present

## 2016-06-04 DIAGNOSIS — I130 Hypertensive heart and chronic kidney disease with heart failure and stage 1 through stage 4 chronic kidney disease, or unspecified chronic kidney disease: Secondary | ICD-10-CM | POA: Diagnosis not present

## 2016-06-04 DIAGNOSIS — E1142 Type 2 diabetes mellitus with diabetic polyneuropathy: Secondary | ICD-10-CM | POA: Diagnosis not present

## 2016-06-04 DIAGNOSIS — E1165 Type 2 diabetes mellitus with hyperglycemia: Secondary | ICD-10-CM | POA: Diagnosis not present

## 2016-06-04 DIAGNOSIS — G894 Chronic pain syndrome: Secondary | ICD-10-CM | POA: Diagnosis not present

## 2016-06-04 DIAGNOSIS — Z7901 Long term (current) use of anticoagulants: Secondary | ICD-10-CM | POA: Diagnosis not present

## 2016-06-04 DIAGNOSIS — Z86711 Personal history of pulmonary embolism: Secondary | ICD-10-CM | POA: Diagnosis not present

## 2016-06-04 DIAGNOSIS — E1122 Type 2 diabetes mellitus with diabetic chronic kidney disease: Secondary | ICD-10-CM | POA: Diagnosis not present

## 2016-06-04 DIAGNOSIS — N183 Chronic kidney disease, stage 3 (moderate): Secondary | ICD-10-CM | POA: Diagnosis not present

## 2016-06-04 DIAGNOSIS — I25118 Atherosclerotic heart disease of native coronary artery with other forms of angina pectoris: Secondary | ICD-10-CM | POA: Diagnosis not present

## 2016-06-04 DIAGNOSIS — I5022 Chronic systolic (congestive) heart failure: Secondary | ICD-10-CM | POA: Diagnosis not present

## 2016-06-11 DIAGNOSIS — E1165 Type 2 diabetes mellitus with hyperglycemia: Secondary | ICD-10-CM | POA: Diagnosis not present

## 2016-06-11 DIAGNOSIS — N183 Chronic kidney disease, stage 3 (moderate): Secondary | ICD-10-CM | POA: Diagnosis not present

## 2016-06-11 DIAGNOSIS — E1142 Type 2 diabetes mellitus with diabetic polyneuropathy: Secondary | ICD-10-CM | POA: Diagnosis not present

## 2016-06-11 DIAGNOSIS — I5022 Chronic systolic (congestive) heart failure: Secondary | ICD-10-CM | POA: Diagnosis not present

## 2016-06-11 DIAGNOSIS — Z7901 Long term (current) use of anticoagulants: Secondary | ICD-10-CM | POA: Diagnosis not present

## 2016-06-11 DIAGNOSIS — I25118 Atherosclerotic heart disease of native coronary artery with other forms of angina pectoris: Secondary | ICD-10-CM | POA: Diagnosis not present

## 2016-06-11 DIAGNOSIS — I130 Hypertensive heart and chronic kidney disease with heart failure and stage 1 through stage 4 chronic kidney disease, or unspecified chronic kidney disease: Secondary | ICD-10-CM | POA: Diagnosis not present

## 2016-06-11 DIAGNOSIS — Z89511 Acquired absence of right leg below knee: Secondary | ICD-10-CM | POA: Diagnosis not present

## 2016-06-11 DIAGNOSIS — E1122 Type 2 diabetes mellitus with diabetic chronic kidney disease: Secondary | ICD-10-CM | POA: Diagnosis not present

## 2016-06-11 DIAGNOSIS — G894 Chronic pain syndrome: Secondary | ICD-10-CM | POA: Diagnosis not present

## 2016-06-11 DIAGNOSIS — Z86711 Personal history of pulmonary embolism: Secondary | ICD-10-CM | POA: Diagnosis not present

## 2016-06-11 DIAGNOSIS — L97421 Non-pressure chronic ulcer of left heel and midfoot limited to breakdown of skin: Secondary | ICD-10-CM | POA: Diagnosis not present

## 2016-06-11 DIAGNOSIS — E1151 Type 2 diabetes mellitus with diabetic peripheral angiopathy without gangrene: Secondary | ICD-10-CM | POA: Diagnosis not present

## 2016-06-11 DIAGNOSIS — E11621 Type 2 diabetes mellitus with foot ulcer: Secondary | ICD-10-CM | POA: Diagnosis not present

## 2016-07-03 DIAGNOSIS — I2782 Chronic pulmonary embolism: Secondary | ICD-10-CM | POA: Diagnosis not present

## 2016-08-02 DIAGNOSIS — I2782 Chronic pulmonary embolism: Secondary | ICD-10-CM | POA: Diagnosis not present

## 2016-09-02 DIAGNOSIS — I2782 Chronic pulmonary embolism: Secondary | ICD-10-CM | POA: Diagnosis not present

## 2016-09-04 DIAGNOSIS — S61012A Laceration without foreign body of left thumb without damage to nail, initial encounter: Secondary | ICD-10-CM | POA: Diagnosis not present

## 2016-09-04 DIAGNOSIS — S56022A Laceration of flexor muscle, fascia and tendon of left thumb at forearm level, initial encounter: Secondary | ICD-10-CM | POA: Diagnosis not present

## 2016-09-04 DIAGNOSIS — S66022A Laceration of long flexor muscle, fascia and tendon of left thumb at wrist and hand level, initial encounter: Secondary | ICD-10-CM | POA: Diagnosis not present

## 2016-09-05 ENCOUNTER — Ambulatory Visit: Payer: Self-pay | Admitting: Orthopedic Surgery

## 2016-09-05 ENCOUNTER — Encounter (HOSPITAL_COMMUNITY): Payer: Self-pay | Admitting: *Deleted

## 2016-09-05 DIAGNOSIS — M79642 Pain in left hand: Secondary | ICD-10-CM | POA: Diagnosis not present

## 2016-09-05 DIAGNOSIS — S61412A Laceration without foreign body of left hand, initial encounter: Secondary | ICD-10-CM | POA: Diagnosis not present

## 2016-09-05 MED ORDER — VANCOMYCIN HCL 500 MG IV SOLR
500.0000 mg | INTRAVENOUS | Status: DC
  Filled 2016-09-05: qty 500

## 2016-09-05 NOTE — Progress Notes (Signed)
Pt SDW-Pre-op call completed by both pt and spouse with pt verbal consent. Spouse stated that pt was instructed that he can eat up until 8:00. A.M.on DOS and to not take any diabetes medication (insulin) on morning of procedure. Spouse stated that pt last dose of Xarelto was yesterday as instructed by MD. Spouse stated that pt has not been taking Aspirin. Spouse made aware to have pt stop taking vitamins, fish oil and herbal medications. Do not take any NSAIDs ie: Ibuprofen, Advil, Naproxen, BC and Goody Powder.  Spouse made aware to have pt check BG every 2 hours prior to arrival on morning of surgery, interventions for a BG < 70 (4 ounces of Apple or Cranberry Juice and recheck BG in 15 minutes after drinking juice). Call SS if BG is still <70 after drinking juice or >220.  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 Please review allergy DOS.

## 2016-09-06 ENCOUNTER — Ambulatory Visit (HOSPITAL_COMMUNITY): Payer: Medicare Other | Admitting: Anesthesiology

## 2016-09-06 ENCOUNTER — Encounter (HOSPITAL_COMMUNITY): Payer: Self-pay | Admitting: *Deleted

## 2016-09-06 ENCOUNTER — Encounter (HOSPITAL_COMMUNITY)
Admission: RE | Disposition: A | Discharge status: Home / Self Care | Payer: Self-pay | Source: Ambulatory Visit | Attending: Orthopedic Surgery

## 2016-09-06 ENCOUNTER — Observation Stay (HOSPITAL_COMMUNITY)
Admission: RE | Admit: 2016-09-06 | Discharge: 2016-09-07 | Disposition: A | Discharge status: Home / Self Care | Payer: Medicare Other | Source: Ambulatory Visit | Attending: Orthopedic Surgery | Admitting: Orthopedic Surgery

## 2016-09-06 DIAGNOSIS — F17210 Nicotine dependence, cigarettes, uncomplicated: Secondary | ICD-10-CM | POA: Diagnosis not present

## 2016-09-06 DIAGNOSIS — I130 Hypertensive heart and chronic kidney disease with heart failure and stage 1 through stage 4 chronic kidney disease, or unspecified chronic kidney disease: Secondary | ICD-10-CM | POA: Insufficient documentation

## 2016-09-06 DIAGNOSIS — I2510 Atherosclerotic heart disease of native coronary artery without angina pectoris: Secondary | ICD-10-CM | POA: Insufficient documentation

## 2016-09-06 DIAGNOSIS — Z880 Allergy status to penicillin: Secondary | ICD-10-CM | POA: Diagnosis not present

## 2016-09-06 DIAGNOSIS — S66822A Laceration of other specified muscles, fascia and tendons at wrist and hand level, left hand, initial encounter: Secondary | ICD-10-CM | POA: Insufficient documentation

## 2016-09-06 DIAGNOSIS — F329 Major depressive disorder, single episode, unspecified: Secondary | ICD-10-CM | POA: Insufficient documentation

## 2016-09-06 DIAGNOSIS — I252 Old myocardial infarction: Secondary | ICD-10-CM | POA: Insufficient documentation

## 2016-09-06 DIAGNOSIS — S66022A Laceration of long flexor muscle, fascia and tendon of left thumb at wrist and hand level, initial encounter: Secondary | ICD-10-CM | POA: Diagnosis not present

## 2016-09-06 DIAGNOSIS — E1151 Type 2 diabetes mellitus with diabetic peripheral angiopathy without gangrene: Secondary | ICD-10-CM | POA: Insufficient documentation

## 2016-09-06 DIAGNOSIS — E1140 Type 2 diabetes mellitus with diabetic neuropathy, unspecified: Secondary | ICD-10-CM | POA: Diagnosis not present

## 2016-09-06 DIAGNOSIS — W278XXA Contact with other nonpowered hand tool, initial encounter: Secondary | ICD-10-CM | POA: Insufficient documentation

## 2016-09-06 DIAGNOSIS — Z888 Allergy status to other drugs, medicaments and biological substances status: Secondary | ICD-10-CM | POA: Insufficient documentation

## 2016-09-06 DIAGNOSIS — G4730 Sleep apnea, unspecified: Secondary | ICD-10-CM | POA: Insufficient documentation

## 2016-09-06 DIAGNOSIS — IMO0002 Reserved for concepts with insufficient information to code with codable children: Secondary | ICD-10-CM

## 2016-09-06 DIAGNOSIS — Z7982 Long term (current) use of aspirin: Secondary | ICD-10-CM | POA: Insufficient documentation

## 2016-09-06 DIAGNOSIS — S61412A Laceration without foreign body of left hand, initial encounter: Secondary | ICD-10-CM | POA: Diagnosis not present

## 2016-09-06 DIAGNOSIS — S61012A Laceration without foreign body of left thumb without damage to nail, initial encounter: Secondary | ICD-10-CM | POA: Insufficient documentation

## 2016-09-06 DIAGNOSIS — K219 Gastro-esophageal reflux disease without esophagitis: Secondary | ICD-10-CM | POA: Diagnosis not present

## 2016-09-06 DIAGNOSIS — I5022 Chronic systolic (congestive) heart failure: Secondary | ICD-10-CM | POA: Insufficient documentation

## 2016-09-06 DIAGNOSIS — Z89511 Acquired absence of right leg below knee: Secondary | ICD-10-CM | POA: Insufficient documentation

## 2016-09-06 DIAGNOSIS — S6432XA Injury of digital nerve of left thumb, initial encounter: Secondary | ICD-10-CM | POA: Diagnosis not present

## 2016-09-06 DIAGNOSIS — Z7901 Long term (current) use of anticoagulants: Secondary | ICD-10-CM | POA: Insufficient documentation

## 2016-09-06 DIAGNOSIS — E1165 Type 2 diabetes mellitus with hyperglycemia: Secondary | ICD-10-CM | POA: Insufficient documentation

## 2016-09-06 DIAGNOSIS — E785 Hyperlipidemia, unspecified: Secondary | ICD-10-CM | POA: Insufficient documentation

## 2016-09-06 DIAGNOSIS — S6722XA Crushing injury of left hand, initial encounter: Secondary | ICD-10-CM | POA: Diagnosis not present

## 2016-09-06 DIAGNOSIS — Z794 Long term (current) use of insulin: Secondary | ICD-10-CM | POA: Diagnosis not present

## 2016-09-06 DIAGNOSIS — E1122 Type 2 diabetes mellitus with diabetic chronic kidney disease: Secondary | ICD-10-CM | POA: Diagnosis not present

## 2016-09-06 DIAGNOSIS — S66222A Laceration of extensor muscle, fascia and tendon of left thumb at wrist and hand level, initial encounter: Secondary | ICD-10-CM | POA: Diagnosis not present

## 2016-09-06 DIAGNOSIS — I739 Peripheral vascular disease, unspecified: Secondary | ICD-10-CM | POA: Diagnosis not present

## 2016-09-06 DIAGNOSIS — N182 Chronic kidney disease, stage 2 (mild): Secondary | ICD-10-CM | POA: Diagnosis not present

## 2016-09-06 HISTORY — PX: INCISION AND DRAINAGE OF WOUND: SHX1803

## 2016-09-06 HISTORY — DX: Unspecified asthma, uncomplicated: J45.909

## 2016-09-06 HISTORY — DX: Hyperlipidemia, unspecified: E78.5

## 2016-09-06 HISTORY — DX: Gastro-esophageal reflux disease without esophagitis: K21.9

## 2016-09-06 HISTORY — DX: Pneumonia, unspecified organism: J18.9

## 2016-09-06 HISTORY — DX: Heart failure, unspecified: I50.9

## 2016-09-06 HISTORY — DX: Depression, unspecified: F32.A

## 2016-09-06 HISTORY — PX: OTHER SURGICAL HISTORY: SHX169

## 2016-09-06 HISTORY — DX: Headache, unspecified: R51.9

## 2016-09-06 HISTORY — DX: Atherosclerotic heart disease of native coronary artery without angina pectoris: I25.10

## 2016-09-06 HISTORY — DX: Essential (primary) hypertension: I10

## 2016-09-06 HISTORY — DX: Type 2 diabetes mellitus with diabetic neuropathy, unspecified: E11.40

## 2016-09-06 HISTORY — DX: Major depressive disorder, single episode, unspecified: F32.9

## 2016-09-06 HISTORY — DX: Sleep apnea, unspecified: G47.30

## 2016-09-06 HISTORY — DX: Anemia, unspecified: D64.9

## 2016-09-06 HISTORY — DX: Acute myocardial infarction, unspecified: I21.9

## 2016-09-06 HISTORY — DX: Headache: R51

## 2016-09-06 HISTORY — DX: Chronic kidney disease, unspecified: N18.9

## 2016-09-06 LAB — CBC
HCT: 36.7 % — ABNORMAL LOW (ref 39.0–52.0)
Hemoglobin: 12.6 g/dL — ABNORMAL LOW (ref 13.0–17.0)
MCH: 28.6 pg (ref 26.0–34.0)
MCHC: 34.3 g/dL (ref 30.0–36.0)
MCV: 83.4 fL (ref 78.0–100.0)
PLATELETS: 153 10*3/uL (ref 150–400)
RBC: 4.4 MIL/uL (ref 4.22–5.81)
RDW: 13.5 % (ref 11.5–15.5)
WBC: 7.4 10*3/uL (ref 4.0–10.5)

## 2016-09-06 LAB — BASIC METABOLIC PANEL
Anion gap: 9 (ref 5–15)
BUN: 25 mg/dL — AB (ref 6–20)
CALCIUM: 9 mg/dL (ref 8.9–10.3)
CHLORIDE: 97 mmol/L — AB (ref 101–111)
CO2: 28 mmol/L (ref 22–32)
CREATININE: 1.55 mg/dL — AB (ref 0.61–1.24)
GFR, EST AFRICAN AMERICAN: 58 mL/min — AB (ref >60)
GFR, EST NON AFRICAN AMERICAN: 50 mL/min — AB (ref >60)
Glucose, Bld: 195 mg/dL — ABNORMAL HIGH (ref 65–99)
Potassium: 4.2 mmol/L (ref 3.5–5.1)
SODIUM: 134 mmol/L — AB (ref 135–145)

## 2016-09-06 LAB — GLUCOSE, CAPILLARY
GLUCOSE-CAPILLARY: 193 mg/dL — AB (ref 65–99)
Glucose-Capillary: 158 mg/dL — ABNORMAL HIGH (ref 65–99)
Glucose-Capillary: 187 mg/dL — ABNORMAL HIGH (ref 65–99)

## 2016-09-06 SURGERY — IRRIGATION AND DEBRIDEMENT WOUND
Anesthesia: General | Site: Hand | Laterality: Left

## 2016-09-06 MED ORDER — GABAPENTIN 600 MG PO TABS
600.0000 mg | ORAL_TABLET | Freq: Every day | ORAL | Status: DC
  Administered 2016-09-07: 600 mg via ORAL
  Filled 2016-09-06: qty 1

## 2016-09-06 MED ORDER — SUCCINYLCHOLINE 20MG/ML (10ML) SYRINGE FOR MEDFUSION PUMP - OPTIME
INTRAMUSCULAR | Status: DC | PRN
  Administered 2016-09-06: 100 mg via INTRAVENOUS

## 2016-09-06 MED ORDER — CLINDAMYCIN PHOSPHATE 900 MG/50ML IV SOLN
900.0000 mg | Freq: Three times a day (TID) | INTRAVENOUS | Status: DC
  Administered 2016-09-07 (×2): 900 mg via INTRAVENOUS
  Filled 2016-09-06 (×3): qty 50

## 2016-09-06 MED ORDER — FENTANYL CITRATE (PF) 100 MCG/2ML IJ SOLN
25.0000 ug | INTRAMUSCULAR | Status: DC | PRN
  Administered 2016-09-06: 50 ug via INTRAVENOUS

## 2016-09-06 MED ORDER — ONDANSETRON HCL 4 MG/2ML IJ SOLN
INTRAMUSCULAR | Status: DC | PRN
  Administered 2016-09-06: 4 mg via INTRAVENOUS

## 2016-09-06 MED ORDER — INSULIN ASPART 100 UNIT/ML ~~LOC~~ SOLN
0.0000 [IU] | Freq: Three times a day (TID) | SUBCUTANEOUS | Status: DC
  Administered 2016-09-07: 8 [IU] via SUBCUTANEOUS
  Administered 2016-09-07: 10 [IU] via SUBCUTANEOUS

## 2016-09-06 MED ORDER — FENTANYL CITRATE (PF) 100 MCG/2ML IJ SOLN
INTRAMUSCULAR | Status: DC | PRN
  Administered 2016-09-06: 50 ug via INTRAVENOUS

## 2016-09-06 MED ORDER — LIDOCAINE HCL (CARDIAC) 20 MG/ML IV SOLN
INTRAVENOUS | Status: DC | PRN
  Administered 2016-09-06: 60 mg via INTRAVENOUS

## 2016-09-06 MED ORDER — CHLORHEXIDINE GLUCONATE 4 % EX LIQD: 60.0000 mL | Freq: Once | CUTANEOUS | Status: DC

## 2016-09-06 MED ORDER — LACTATED RINGERS IV SOLN
INTRAVENOUS | Status: DC
  Administered 2016-09-06: 22:00:00 via INTRAVENOUS

## 2016-09-06 MED ORDER — LACTATED RINGERS IV SOLN
INTRAVENOUS | Status: DC
  Administered 2016-09-06 (×3): via INTRAVENOUS

## 2016-09-06 MED ORDER — FENTANYL CITRATE (PF) 100 MCG/2ML IJ SOLN: 25.0000 ug | INTRAMUSCULAR | Status: DC | PRN

## 2016-09-06 MED ORDER — EPHEDRINE SULFATE 50 MG/ML IJ SOLN
INTRAMUSCULAR | Status: DC | PRN
  Administered 2016-09-06: 20 mg via INTRAVENOUS
  Administered 2016-09-06: 25 mg via INTRAVENOUS

## 2016-09-06 MED ORDER — DOCUSATE SODIUM 100 MG PO CAPS
100.0000 mg | ORAL_CAPSULE | Freq: Two times a day (BID) | ORAL | Status: DC
  Administered 2016-09-07: 100 mg via ORAL
  Filled 2016-09-06 (×2): qty 1

## 2016-09-06 MED ORDER — ONDANSETRON HCL 4 MG/2ML IJ SOLN: 4.0000 mg | Freq: Once | INTRAMUSCULAR | Status: DC | PRN

## 2016-09-06 MED ORDER — OXYCODONE HCL 5 MG/5ML PO SOLN: 5.0000 mg | Freq: Once | ORAL | Status: DC | PRN

## 2016-09-06 MED ORDER — OXYCODONE HCL 5 MG PO TABS: 5.0000 mg | ORAL_TABLET | Freq: Once | ORAL | Status: DC | PRN

## 2016-09-06 MED ORDER — OXYCODONE HCL 5 MG PO TABS
5.0000 mg | ORAL_TABLET | ORAL | Status: DC | PRN
  Administered 2016-09-06 – 2016-09-07 (×3): 10 mg via ORAL
  Filled 2016-09-06 (×4): qty 2

## 2016-09-06 MED ORDER — DEXAMETHASONE SODIUM PHOSPHATE 4 MG/ML IJ SOLN
INTRAMUSCULAR | Status: DC | PRN
  Administered 2016-09-06: 8 mg via INTRAVENOUS

## 2016-09-06 MED ORDER — PROMETHAZINE HCL 25 MG RE SUPP: 12.5000 mg | Freq: Four times a day (QID) | RECTAL | Status: DC | PRN

## 2016-09-06 MED ORDER — PROPOFOL 10 MG/ML IV BOLUS
INTRAVENOUS | Status: AC
  Filled 2016-09-06: qty 20

## 2016-09-06 MED ORDER — PHENYLEPHRINE HCL 10 MG/ML IJ SOLN
INTRAMUSCULAR | Status: DC | PRN
  Administered 2016-09-06: 25 ug/min via INTRAVENOUS

## 2016-09-06 MED ORDER — PROPOFOL 10 MG/ML IV BOLUS
INTRAVENOUS | Status: DC | PRN
  Administered 2016-09-06: 150 mg via INTRAVENOUS

## 2016-09-06 MED ORDER — CLINDAMYCIN PHOSPHATE 600 MG/50ML IV SOLN
600.0000 mg | Freq: Once | INTRAVENOUS | Status: AC
  Administered 2016-09-06: 600 mg via INTRAVENOUS

## 2016-09-06 MED ORDER — DEXTROSE 5 % IV SOLN: 400.0000 mg | Freq: Once | INTRAVENOUS | Status: DC

## 2016-09-06 MED ORDER — MIDAZOLAM HCL 2 MG/2ML IJ SOLN
INTRAMUSCULAR | Status: AC
  Filled 2016-09-06: qty 2

## 2016-09-06 MED ORDER — ONDANSETRON HCL 4 MG/2ML IJ SOLN: 4.0000 mg | Freq: Four times a day (QID) | INTRAMUSCULAR | Status: DC | PRN

## 2016-09-06 MED ORDER — FENTANYL CITRATE (PF) 100 MCG/2ML IJ SOLN
INTRAMUSCULAR | Status: AC
  Administered 2016-09-06: 50 ug via INTRAVENOUS
  Filled 2016-09-06: qty 2

## 2016-09-06 MED ORDER — RANOLAZINE ER 500 MG PO TB12
500.0000 mg | ORAL_TABLET | Freq: Two times a day (BID) | ORAL | Status: DC
  Administered 2016-09-07 (×2): 500 mg via ORAL
  Filled 2016-09-06 (×2): qty 1

## 2016-09-06 MED ORDER — ISOSORBIDE MONONITRATE ER 60 MG PO TB24
60.0000 mg | ORAL_TABLET | Freq: Every day | ORAL | Status: DC
  Administered 2016-09-07: 60 mg via ORAL
  Filled 2016-09-06: qty 1

## 2016-09-06 MED ORDER — FENTANYL CITRATE (PF) 100 MCG/2ML IJ SOLN
INTRAMUSCULAR | Status: AC
  Filled 2016-09-06: qty 4

## 2016-09-06 MED ORDER — VITAMIN C 500 MG PO TABS
1000.0000 mg | ORAL_TABLET | Freq: Every day | ORAL | Status: DC
  Administered 2016-09-07: 1000 mg via ORAL
  Filled 2016-09-06: qty 2

## 2016-09-06 MED ORDER — ONDANSETRON HCL 4 MG PO TABS: 4.0000 mg | ORAL_TABLET | Freq: Four times a day (QID) | ORAL | Status: DC | PRN

## 2016-09-06 MED ORDER — ATORVASTATIN CALCIUM 40 MG PO TABS
40.0000 mg | ORAL_TABLET | Freq: Every day | ORAL | Status: DC
  Administered 2016-09-07: 40 mg via ORAL
  Filled 2016-09-06: qty 1

## 2016-09-06 MED ORDER — FENTANYL CITRATE (PF) 100 MCG/2ML IJ SOLN
INTRAMUSCULAR | Status: AC
  Filled 2016-09-06: qty 2

## 2016-09-06 MED ORDER — MIDAZOLAM HCL 5 MG/5ML IJ SOLN
INTRAMUSCULAR | Status: DC | PRN
  Administered 2016-09-06: 2 mg via INTRAVENOUS

## 2016-09-06 MED ORDER — ASPIRIN EC 81 MG PO TBEC
81.0000 mg | DELAYED_RELEASE_TABLET | Freq: Every day | ORAL | Status: DC
  Administered 2016-09-07: 81 mg via ORAL
  Filled 2016-09-06: qty 1

## 2016-09-06 MED ORDER — INSULIN ASPART 100 UNIT/ML ~~LOC~~ SOLN
4.0000 [IU] | Freq: Three times a day (TID) | SUBCUTANEOUS | Status: DC
  Administered 2016-09-07 (×2): 4 [IU] via SUBCUTANEOUS

## 2016-09-06 MED ORDER — 0.9 % SODIUM CHLORIDE (POUR BTL) OPTIME
TOPICAL | Status: DC | PRN
  Administered 2016-09-06: 1000 mL

## 2016-09-06 MED ORDER — MORPHINE SULFATE (PF) 2 MG/ML IV SOLN
1.0000 mg | INTRAVENOUS | Status: DC | PRN
  Administered 2016-09-06 – 2016-09-07 (×2): 1 mg via INTRAVENOUS
  Filled 2016-09-06 (×2): qty 1

## 2016-09-06 MED ORDER — FENTANYL CITRATE (PF) 100 MCG/2ML IJ SOLN
50.0000 ug | Freq: Once | INTRAMUSCULAR | Status: AC
  Administered 2016-09-06: 50 ug via INTRAVENOUS

## 2016-09-06 MED ORDER — SODIUM CHLORIDE 0.9 % IR SOLN
Status: DC | PRN
  Administered 2016-09-06: 3000 mL

## 2016-09-06 MED ORDER — INSULIN GLARGINE 100 UNIT/ML ~~LOC~~ SOLN
60.0000 [IU] | Freq: Every day | SUBCUTANEOUS | Status: DC
  Administered 2016-09-07: 60 [IU] via SUBCUTANEOUS
  Filled 2016-09-06: qty 0.6

## 2016-09-06 MED ORDER — BUPIVACAINE HCL (PF) 0.25 % IJ SOLN
INTRAMUSCULAR | Status: AC
  Filled 2016-09-06: qty 30

## 2016-09-06 MED ORDER — CLINDAMYCIN PHOSPHATE 600 MG/50ML IV SOLN
INTRAVENOUS | Status: AC
  Filled 2016-09-06: qty 50

## 2016-09-06 MED ORDER — GABAPENTIN 300 MG PO CAPS
900.0000 mg | ORAL_CAPSULE | Freq: Every day | ORAL | Status: DC
  Administered 2016-09-07: 900 mg via ORAL
  Filled 2016-09-06 (×2): qty 3

## 2016-09-06 MED ORDER — FENTANYL CITRATE (PF) 100 MCG/2ML IJ SOLN
100.0000 ug | Freq: Once | INTRAMUSCULAR | Status: AC
  Administered 2016-09-06: 50 ug via INTRAVENOUS

## 2016-09-06 MED ORDER — PHENYLEPHRINE HCL 10 MG/ML IJ SOLN
INTRAMUSCULAR | Status: DC | PRN
  Administered 2016-09-06: 100 ug via INTRAVENOUS
  Administered 2016-09-06: 160 ug via INTRAVENOUS

## 2016-09-06 SURGICAL SUPPLY — 54 items
BANDAGE ACE 3X5.8 VEL STRL LF (GAUZE/BANDAGES/DRESSINGS) ×2 IMPLANT
BANDAGE ACE 4X5 VEL STRL LF (GAUZE/BANDAGES/DRESSINGS) ×2 IMPLANT
BNDG CONFORM 2 STRL LF (GAUZE/BANDAGES/DRESSINGS) ×2 IMPLANT
BNDG GAUZE ELAST 4 BULKY (GAUZE/BANDAGES/DRESSINGS) ×2 IMPLANT
CORDS BIPOLAR (ELECTRODE) ×2 IMPLANT
CUFF TOURNIQUET SINGLE 18IN (TOURNIQUET CUFF) ×2 IMPLANT
DRSG ADAPTIC 3X8 NADH LF (GAUZE/BANDAGES/DRESSINGS) ×2 IMPLANT
GAUZE SPONGE 4X4 12PLY STRL (GAUZE/BANDAGES/DRESSINGS) ×2 IMPLANT
GAUZE XEROFORM 1X8 LF (GAUZE/BANDAGES/DRESSINGS) ×2 IMPLANT
GLOVE BIOGEL M 8.0 STRL (GLOVE) IMPLANT
GLOVE BIOGEL M STRL SZ7.5 (GLOVE) IMPLANT
GLOVE BIOGEL PI IND STRL 7.0 (GLOVE) ×1 IMPLANT
GLOVE BIOGEL PI INDICATOR 7.0 (GLOVE) ×1
GLOVE SS BIOGEL STRL SZ 8 (GLOVE) ×1 IMPLANT
GLOVE SUPERSENSE BIOGEL SZ 8 (GLOVE) ×1
GOWN STRL REUS W/ TWL LRG LVL3 (GOWN DISPOSABLE) ×2 IMPLANT
GOWN STRL REUS W/ TWL XL LVL3 (GOWN DISPOSABLE) ×1 IMPLANT
GOWN STRL REUS W/TWL LRG LVL3 (GOWN DISPOSABLE) ×2
GOWN STRL REUS W/TWL XL LVL3 (GOWN DISPOSABLE) ×1
GUIDE NERVE NEURAGIEN 3MMX3CM (Tissue) ×1 IMPLANT
HANDPIECE INTERPULSE COAX TIP (DISPOSABLE)
KIT BASIN OR (CUSTOM PROCEDURE TRAY) ×2 IMPLANT
KIT ROOM TURNOVER OR (KITS) ×2 IMPLANT
MANIFOLD NEPTUNE II (INSTRUMENTS) ×2 IMPLANT
NERVE GUIDE NEURAGIEN 3MMX3CM (Tissue) ×2 IMPLANT
NS IRRIG 1000ML POUR BTL (IV SOLUTION) ×2 IMPLANT
PACK ORTHO EXTREMITY (CUSTOM PROCEDURE TRAY) ×2 IMPLANT
PAD ARMBOARD 7.5X6 YLW CONV (MISCELLANEOUS) ×4 IMPLANT
PAD CAST 3X4 CTTN HI CHSV (CAST SUPPLIES) ×1 IMPLANT
PAD CAST 4YDX4 CTTN HI CHSV (CAST SUPPLIES) ×1 IMPLANT
PADDING CAST COTTON 3X4 STRL (CAST SUPPLIES) ×1
PADDING CAST COTTON 4X4 STRL (CAST SUPPLIES) ×1
SCRUB BETADINE 4OZ XXX (MISCELLANEOUS) IMPLANT
SCRUB POVIDONE IODINE 4 OZ (MISCELLANEOUS) ×2 IMPLANT
SET CYSTO W/LG BORE CLAMP LF (SET/KITS/TRAYS/PACK) ×2 IMPLANT
SET HNDPC FAN SPRY TIP SCT (DISPOSABLE) IMPLANT
SOL PREP POV-IOD 4OZ 10% (MISCELLANEOUS) ×2 IMPLANT
SOLUTION BETADINE 4OZ (MISCELLANEOUS) ×2 IMPLANT
SPLINT FIBERGLASS 4X30 (CAST SUPPLIES) ×2 IMPLANT
SPONGE LAP 4X18 X RAY DECT (DISPOSABLE) IMPLANT
SUT ETHILON 8 0 BV130 4 (SUTURE) ×2 IMPLANT
SUT FIBERWIRE 2-0 18 17.9 3/8 (SUTURE) ×2
SUT FIBERWIRE 3-0 18 TAPR NDL (SUTURE) ×4
SUT PROLENE 3 0 PS 1 (SUTURE) ×2 IMPLANT
SUT PROLENE 4 0 PS 2 18 (SUTURE) ×4 IMPLANT
SUTURE FIBERWR 2-0 18 17.9 3/8 (SUTURE) ×1 IMPLANT
SUTURE FIBERWR 3-0 18 TAPR NDL (SUTURE) ×2 IMPLANT
SYR CONTROL 10ML LL (SYRINGE) IMPLANT
TOWEL OR 17X24 6PK STRL BLUE (TOWEL DISPOSABLE) ×2 IMPLANT
TOWEL OR 17X26 10 PK STRL BLUE (TOWEL DISPOSABLE) ×2 IMPLANT
TUBE ANAEROBIC SPECIMEN COL (MISCELLANEOUS) IMPLANT
TUBE CONNECTING 12X1/4 (SUCTIONS) ×2 IMPLANT
WATER STERILE IRR 1000ML POUR (IV SOLUTION) IMPLANT
YANKAUER SUCT BULB TIP NO VENT (SUCTIONS) IMPLANT

## 2016-09-06 NOTE — Anesthesia Procedure Notes (Signed)
Procedure Name: Intubation Date/Time: 09/06/2016 5:03 PM Performed by: Oletta Lamas Pre-anesthesia Checklist: Patient identified, Emergency Drugs available, Suction available and Patient being monitored Patient Re-evaluated:Patient Re-evaluated prior to inductionOxygen Delivery Method: Circle System Utilized Preoxygenation: Pre-oxygenation with 100% oxygen Intubation Type: IV induction Ventilation: Mask ventilation without difficulty Grade View: Grade I Tube type: Oral Tube size: 7.5 mm Number of attempts: 1 Airway Equipment and Method: Stylet Placement Confirmation: ETT inserted through vocal cords under direct vision,  positive ETCO2 and breath sounds checked- equal and bilateral Secured at: 23 cm Tube secured with: Tape Dental Injury: Teeth and Oropharynx as per pre-operative assessment

## 2016-09-06 NOTE — Anesthesia Postprocedure Evaluation (Addendum)
Anesthesia Post Note  Patient: Eduardo Stewart  Procedure(s) Performed: Procedure(s) (LRB): IRRIGATION AND DEBRIDEMENT OF LEFT HAND WITH REPAIR OF TENDON, NERVE AND ARTERY AS NECESSARY (Left)  Patient location during evaluation: PACU Anesthesia Type: General Level of consciousness: awake and alert Pain management: pain level controlled Vital Signs Assessment: post-procedure vital signs reviewed and stable Respiratory status: spontaneous breathing, nonlabored ventilation, respiratory function stable and patient connected to nasal cannula oxygen Cardiovascular status: blood pressure returned to baseline and stable Postop Assessment: no signs of nausea or vomiting Anesthetic complications: no       Last Vitals:  Vitals:   09/06/16 1646 09/06/16 2026  BP: 104/72   Pulse: 82   Resp: 18 16  Temp: 37.4 C 36.2 C    Last Pain:  Vitals:   09/06/16 1646  TempSrc: Oral  PainSc:                  Abubakr Wieman S

## 2016-09-06 NOTE — Transfer of Care (Signed)
Immediate Anesthesia Transfer of Care Note  Patient: Eduardo Stewart  Procedure(s) Performed: Procedure(s): IRRIGATION AND DEBRIDEMENT OF LEFT HAND WITH REPAIR OF TENDON, NERVE AND ARTERY AS NECESSARY (Left)  Patient Location: PACU  Anesthesia Type:General  Level of Consciousness: awake, patient cooperative and responds to stimulation  Airway & Oxygen Therapy: Patient Spontanous Breathing and Patient connected to face mask oxygen  Post-op Assessment: Report given to RN and Post -op Vital signs reviewed and stable  Post vital signs: Reviewed and stable  Last Vitals:  Vitals:   09/06/16 1646 09/06/16 2026  BP: 104/72   Pulse: 82   Resp: 18 (P) 16  Temp: 37.4 C (P) 36.2 C    Last Pain:  Vitals:   09/06/16 1646  TempSrc: Oral  PainSc:       Patients Stated Pain Goal: 0 (50/35/46 [id number])  Complications: No apparent anesthesia complications   Resting eyes closed. Drowsy. Answers questions. Responds to verbal stimulation. VSS

## 2016-09-06 NOTE — Op Note (Signed)
See full dictation [phone]  Status post irrigation debridement, FPL repair, APB repair, radial digital nerve repair, ulnar digital nerve neurolysis, complex closure left thumb thenar space and hand  Takelia Urieta M.D.

## 2016-09-06 NOTE — Anesthesia Procedure Notes (Signed)
Procedure Name: LMA Insertion Date/Time: 09/06/2016 6:54 PM Performed by: Gala Lewandowsky B Pre-anesthesia Checklist: Patient identified, Emergency Drugs available, Suction available and Patient being monitored Patient Re-evaluated:Patient Re-evaluated prior to inductionOxygen Delivery Method: Circle System Utilized Preoxygenation: Pre-oxygenation with 100% oxygen Intubation Type: IV induction Ventilation: Mask ventilation without difficulty LMA: LMA inserted LMA Size: 4.0 Number of attempts: 1 Placement Confirmation: positive ETCO2 Tube secured with: Tape Dental Injury: Teeth and Oropharynx as per pre-operative assessment

## 2016-09-06 NOTE — H&P (Signed)
Eduardo Stewart is an 51 y.o. male.   Chief Complaint: Saw injury left thenar space and hand HPI: Patient presents for evaluation and treatment of the of their upper extremity predicament. The patient denies neck, back, chest or  abdominal pain. The patient notes that they have no lower extremity problems. The patients primary complaint is noted. We are planning surgical care pathway for the upper extremity.  Past Medical History:  Diagnosis Date  . Anemia   . Asthma    as a child  . CHF (congestive heart failure) ([place])   . Chronic kidney disease    stage 2  . Coronary artery disease   . Depression   . Diabetes mellitus without complication (HCC)    Type 2  . Diabetic neuropathy ([place])   . Dyslipidemia   . GERD (gastroesophageal reflux disease)   . Headache   . Hypertension   . Myocardial infarction   . Osteomyelitis ([place])   . Pneumonia   . Sleep apnea    Does not wear CPAP    Past Surgical History:  Procedure Laterality Date  . BELOW KNEE LEG AMPUTATION Right   . CARDIAC CATHETERIZATION    . CHOLECYSTECTOMY    . COLONOSCOPY W/ BIOPSIES AND POLYPECTOMY    . CORONARY ARTERY BYPASS GRAFT    . EYE SURGERY     laser  . HERNIA REPAIR      Family History  Problem Relation Age of Onset  . Diabetes Mother   . Stroke Mother   . Diabetes Father   . Colon cancer Father   . Stroke Father   . Congestive Heart Failure Father   . Hypertension Father    Social History:  reports that he has been smoking Cigarettes.  He has never used smokeless tobacco. He reports that he does not drink alcohol or use drugs.  Allergies:  Allergies  Allergen Reactions  . Linezolid Other (See Comments)    Pancytopenia Dec 2016  . Penicillins Anaphylaxis, Hives and Shortness Of Breath    Tolerated Zosyn 05/16/2013  Unknown. Has patient had a PCN reaction causing immediate rash, facial/tongue/throat swelling, SOB or lightheadedness with hypotension: UNSPECIFIED  Has patient had a PCN reaction  causing severe rash involving mucus membranes or skin necrosis: UNSPECIFIED  Has patient had a PCN reaction that required hospitalization UNSPECIFIED  Has patient had a PCN reaction occurring within the last 10 years: #  #  #  YES  #  #  #     . Vancomycin Other (See Comments)    NEPHROTOXICITY 2015 UNSPECIFIED DEGREE OF TOXICITY    Medications Prior to Admission  Medication Sig Dispense Refill  . aspirin EC 81 MG tablet Take 81 mg by mouth daily.    Marland Kitchen atorvastatin (LIPITOR) 40 MG tablet Take 40 mg by mouth at bedtime.    . clindamycin (CLEOCIN) 300 MG capsule Take 300 mg by mouth 4 (four) times daily.    . DULoxetine (CYMBALTA) 60 MG capsule Take 60 mg by mouth 2 (two) times daily.    . ferrous sulfate 325 (65 FE) MG tablet Take 325 mg by mouth daily with breakfast.    . FLUoxetine (PROZAC) 40 MG capsule Take 40 mg by mouth daily.    Marland Kitchen gabapentin (NEURONTIN) 300 MG capsule Take 600-900 mg by mouth 2 (two) times daily. Take 675ms with breakfast and 9011m at night    . insulin degludec (TRESIBA FLEXTOUCH) 100 UNIT/ML SOPN FlexTouch Pen Inject 60 Units into the  skin daily after breakfast.     . insulin lispro (HUMALOG KWIKPEN) 100 UNIT/ML KiwkPen Inject 5-15 Units into the skin. Always inject 5 units 3 times daily, after 2 hours of each dose check blood sugar and add an additional # of units using sliding scale  140 or below = 0 units, 141-181  = 2 units, 182 -223= 4units, 223-365= 8 units 365 or above = 10 units    . isosorbide mononitrate (IMDUR) 60 MG 24 hr tablet Take 60 mg by mouth daily.    Marland Kitchen loperamide (IMODIUM) 2 MG capsule Take 1 capsule (2 mg total) by mouth 3 (three) times daily as needed for diarrhea or loose stools. 30 capsule 0  . omega-3 acid ethyl esters (LOVAZA) 1 G capsule Take 1 g by mouth 2 (two) times daily.     Marland Kitchen oxyCODONE (OXY IR/ROXICODONE) 5 MG immediate release tablet Take 5 mg by mouth every 6 (six) hours as needed for pain.    . pantoprazole (PROTONIX) 40 MG  tablet Take 40 mg by mouth daily.    . pregabalin (LYRICA) 200 MG capsule Take 200 mg by mouth every 12 (twelve) hours.    . ranolazine (RANEXA) 500 MG 12 hr tablet Take 500 mg by mouth 2 (two) times daily.    . rivaroxaban (XARELTO) 20 MG TABS tablet Take 20 mg by mouth every morning.    . vitamin B-12 (CYANOCOBALAMIN) 500 MCG tablet Take 500 mcg by mouth daily.      Results for orders placed or performed during the hospital encounter of 09/06/16 (from the past 48 hour(s))  Glucose, capillary     Status: Abnormal   Collection Time: 09/06/16  4:45 PM  Result Value Ref Range   Glucose-Capillary 193 (H) 65 - 99 mg/dL  Basic metabolic panel     Status: Abnormal   Collection Time: 09/06/16  5:15 PM  Result Value Ref Range   Sodium 134 (L) 135 - 145 mmol/L   Potassium 4.2 3.5 - 5.1 mmol/L   Chloride 97 (L) 101 - 111 mmol/L   CO2 28 22 - 32 mmol/L   Glucose, Bld 195 (H) 65 - 99 mg/dL   BUN 25 (H) 6 - 20 mg/dL   Creatinine, Ser 1.55 (H) 0.61 - 1.24 mg/dL   Calcium 9.0 8.9 - 10.3 mg/dL   GFR calc non Af Amer 50 (L) >60 mL/min   GFR calc Af Amer 58 (L) >60 mL/min    Comment: (NOTE) The eGFR has been calculated using the CKD EPI equation. This calculation has not been validated in all clinical situations. eGFR's persistently <60 mL/min signify possible Chronic Kidney Disease.    Anion gap 9 5 - 15  CBC     Status: Abnormal   Collection Time: 09/06/16  5:15 PM  Result Value Ref Range   WBC 7.4 4.0 - 10.5 K/uL   RBC 4.40 4.22 - 5.81 MIL/uL   Hemoglobin 12.6 (L) 13.0 - 17.0 g/dL   HCT 36.7 (L) 39.0 - 52.0 %   MCV 83.4 78.0 - 100.0 fL   MCH 28.6 26.0 - 34.0 pg   MCHC 34.3 30.0 - 36.0 g/dL   RDW 13.5 11.5 - 15.5 %   Platelets 153 150 - 400 K/uL   No results found.  Review of Systems  Constitutional: Negative.   HENT: Negative.   Eyes: Negative.   Respiratory: Negative.   Cardiovascular: Negative.   Gastrointestinal: Negative.   Genitourinary: Negative.     Blood  pressure  104/72, pulse 82, temperature 99.3 F (37.4 C), temperature source Oral, resp. rate 18, height 5' 9"  (1.753 m), weight 90.7 kg (200 lb), SpO2 100 %. Physical Exam  Left thenar space and hand laceration with loss of FPL as well as loss of digital nerve function. Muscle and tendon involvement of fairly severe. I reviewed this with him at length.  He does have refill to the thumb.  He has a host of medical issues which make his condition challenging and handstands.  The patient is alert and oriented in no acute distress. The patient complains of pain in the affected upper extremity.  The patient is noted to have a normal HEENT exam. Lung fields show equal chest expansion and no shortness of breath. Abdomen exam is nontender without distention. Lower extremity examination does not show any fracture dislocation or blood clot symptoms. Pelvis is stable and the neck and back are stable and nontender. Assessment/Plan Patient will undergo irrigation debridement and repair tendon nerve artery and associated structures as necessary.  We are planning surgery for your upper extremity. The risk and benefits of surgery to include risk of bleeding, infection, anesthesia,  damage to normal structures and failure of the surgery to accomplish its intended goals of relieving symptoms and restoring function have been discussed in detail. With this in mind we plan to proceed. I have specifically discussed with the patient the pre-and postoperative regime and the dos and don'ts and risk and benefits in great detail. Risk and benefits of surgery also include risk of dystrophy(CRPS), chronic nerve pain, failure of the healing process to go onto completion and other inherent risks of surgery The relavent the pathophysiology of the disease/injury process, as well as the alternatives for treatment and postoperative course of action has been discussed in great detail with the patient who desires to proceed.  We will do  everything in our power to help you (the patient) restore function to the upper extremity. It is a pleasure to see this patient today.   Paulene Floor, MD 09/06/2016, 6:52 PM

## 2016-09-06 NOTE — Anesthesia Preprocedure Evaluation (Signed)
Anesthesia Evaluation  Patient identified by MRN, date of birth, ID band Patient awake    Reviewed: Allergy & Precautions, NPO status , Patient's Chart, lab work & pertinent test results  Airway Mallampati: II  TM Distance: >3 FB Neck ROM: Full    Dental  (+) Poor Dentition   Pulmonary Current Smoker,    breath sounds clear to auscultation       Cardiovascular hypertension,  Rhythm:Regular Rate:Normal     Neuro/Psych    GI/Hepatic   Endo/Other  diabetes  Renal/GU      Musculoskeletal   Abdominal   Peds  Hematology   Anesthesia Other Findings   Reproductive/Obstetrics                             Anesthesia Physical Anesthesia Plan  ASA: III  Anesthesia Plan: General   Post-op Pain Management:    Induction: Intravenous  Airway Management Planned: LMA  Additional Equipment:   Intra-op Plan:   Post-operative Plan:   Informed Consent: I have reviewed the patients History and Physical, chart, labs and discussed the procedure including the risks, benefits and alternatives for the proposed anesthesia with the patient or authorized representative who has indicated his/her understanding and acceptance.   Dental advisory given  Plan Discussed with: CRNA and Anesthesiologist  Anesthesia Plan Comments:         Anesthesia Quick Evaluation

## 2016-09-07 ENCOUNTER — Encounter (HOSPITAL_COMMUNITY): Payer: Self-pay | Admitting: General Practice

## 2016-09-07 DIAGNOSIS — E1151 Type 2 diabetes mellitus with diabetic peripheral angiopathy without gangrene: Secondary | ICD-10-CM | POA: Diagnosis not present

## 2016-09-07 DIAGNOSIS — Z794 Long term (current) use of insulin: Secondary | ICD-10-CM | POA: Diagnosis not present

## 2016-09-07 DIAGNOSIS — S61412A Laceration without foreign body of left hand, initial encounter: Secondary | ICD-10-CM | POA: Diagnosis not present

## 2016-09-07 DIAGNOSIS — Z880 Allergy status to penicillin: Secondary | ICD-10-CM | POA: Diagnosis not present

## 2016-09-07 DIAGNOSIS — S6432XA Injury of digital nerve of left thumb, initial encounter: Secondary | ICD-10-CM | POA: Diagnosis not present

## 2016-09-07 DIAGNOSIS — K219 Gastro-esophageal reflux disease without esophagitis: Secondary | ICD-10-CM | POA: Diagnosis not present

## 2016-09-07 DIAGNOSIS — I2510 Atherosclerotic heart disease of native coronary artery without angina pectoris: Secondary | ICD-10-CM | POA: Diagnosis not present

## 2016-09-07 DIAGNOSIS — I5022 Chronic systolic (congestive) heart failure: Secondary | ICD-10-CM | POA: Diagnosis not present

## 2016-09-07 DIAGNOSIS — N182 Chronic kidney disease, stage 2 (mild): Secondary | ICD-10-CM | POA: Diagnosis not present

## 2016-09-07 DIAGNOSIS — G4730 Sleep apnea, unspecified: Secondary | ICD-10-CM | POA: Diagnosis not present

## 2016-09-07 DIAGNOSIS — S66822A Laceration of other specified muscles, fascia and tendons at wrist and hand level, left hand, initial encounter: Secondary | ICD-10-CM | POA: Diagnosis not present

## 2016-09-07 DIAGNOSIS — Z888 Allergy status to other drugs, medicaments and biological substances status: Secondary | ICD-10-CM | POA: Diagnosis not present

## 2016-09-07 DIAGNOSIS — E1122 Type 2 diabetes mellitus with diabetic chronic kidney disease: Secondary | ICD-10-CM | POA: Diagnosis not present

## 2016-09-07 DIAGNOSIS — E1165 Type 2 diabetes mellitus with hyperglycemia: Secondary | ICD-10-CM

## 2016-09-07 DIAGNOSIS — S61012A Laceration without foreign body of left thumb without damage to nail, initial encounter: Secondary | ICD-10-CM | POA: Diagnosis not present

## 2016-09-07 DIAGNOSIS — I130 Hypertensive heart and chronic kidney disease with heart failure and stage 1 through stage 4 chronic kidney disease, or unspecified chronic kidney disease: Secondary | ICD-10-CM | POA: Diagnosis not present

## 2016-09-07 DIAGNOSIS — E785 Hyperlipidemia, unspecified: Secondary | ICD-10-CM | POA: Diagnosis not present

## 2016-09-07 DIAGNOSIS — Z7901 Long term (current) use of anticoagulants: Secondary | ICD-10-CM | POA: Diagnosis not present

## 2016-09-07 DIAGNOSIS — I252 Old myocardial infarction: Secondary | ICD-10-CM | POA: Diagnosis not present

## 2016-09-07 DIAGNOSIS — E1140 Type 2 diabetes mellitus with diabetic neuropathy, unspecified: Secondary | ICD-10-CM | POA: Diagnosis not present

## 2016-09-07 DIAGNOSIS — Z7982 Long term (current) use of aspirin: Secondary | ICD-10-CM | POA: Diagnosis not present

## 2016-09-07 DIAGNOSIS — Z89511 Acquired absence of right leg below knee: Secondary | ICD-10-CM | POA: Diagnosis not present

## 2016-09-07 LAB — GLUCOSE, CAPILLARY
GLUCOSE-CAPILLARY: 445 mg/dL — AB (ref 65–99)
Glucose-Capillary: >600 mg/dL (ref 65–99)
Glucose-Capillary: 475 mg/dL — ABNORMAL HIGH (ref 65–99)
Glucose-Capillary: 536 mg/dL (ref 65–99)
Glucose-Capillary: 413 mg/dL — ABNORMAL HIGH (ref 65–99)
Glucose-Capillary: 475 mg/dL — ABNORMAL HIGH (ref 65–99)
Glucose-Capillary: 403 mg/dL — ABNORMAL HIGH (ref 65–99)

## 2016-09-07 LAB — GLUCOSE, RANDOM: Glucose, Bld: 479 mg/dL — ABNORMAL HIGH (ref 65–99)

## 2016-09-07 MED ORDER — OXYCODONE HCL 5 MG PO TABS: 5.0000 mg | ORAL_TABLET | ORAL | 0 refills | Status: AC | PRN

## 2016-09-07 MED ORDER — PNEUMOCOCCAL VAC POLYVALENT 25 MCG/0.5ML IJ INJ: 0.5000 mL | INJECTION | INTRAMUSCULAR | Status: DC

## 2016-09-07 MED ORDER — CLINDAMYCIN HCL 300 MG PO CAPS: 300.0000 mg | ORAL_CAPSULE | Freq: Three times a day (TID) | ORAL | 0 refills | Status: AC

## 2016-09-07 MED ORDER — INSULIN ASPART 100 UNIT/ML ~~LOC~~ SOLN
20.0000 [IU] | Freq: Once | SUBCUTANEOUS | Status: AC
  Administered 2016-09-07: 20 [IU] via SUBCUTANEOUS

## 2016-09-07 NOTE — Progress Notes (Signed)
Inpatient Diabetes Program Recommendations  AACE/ADA: New Consensus Statement on Inpatient Glycemic Control (2015)  Target Ranges:  Prepandial:   less than 140 mg/dL      Peak postprandial:   less than 180 mg/dL (1-2 hours)      Critically ill patients:  140 - 180 mg/dL   Lab Results  Component Value Date   GLUCAP 445 (H) 09/07/2016   HGBA1C 8.3 (H) 07/31/2015   Results for SEMIR, BRILL (MRN [id number]) as of 09/07/2016 09:05  Ref. Range 09/07/2016 07:22  Glucose-Capillary Latest Ref Range: 65 - 99 mg/dL >600 (HH)   Review of Glycemic Control  Diabetes history: DM2, CKD Outpatient Diabetes medications:     Tresiba 60 units every morning,     Humalog 5 units TIDAC meal coverage - then take CBG 2 hours after eating and give correction of 0-10 units per SSI schedule Current orders for Inpatient glycemic control:     Lantus 60 units every morning    Novolog 4 units TIDAC meal coverage    Novolog 0-15 units TIDAC correction scale  Inpatient Diabetes Program Recommendations, please consider:    Noted high CBG after steroid dose.    While on steroids, please consider increasing to Meal coverage of Novolog 6 units TIDAC if patient eats > 50% of meal.    Will follow A1C.   Thank you,  Windy Carina, RN, MSN Diabetes Coordinator Inpatient Diabetes Program [phone] (Team Pager)

## 2016-09-07 NOTE — Care Management Obs Status (Signed)
[facility] NOTIFICATION   Patient Details  Name: Eduardo Stewart MRN: [id number] Date of Birth: [birth year]   Medicare Observation Status Notification Given:  Yes    Ninfa Meeker, RN 09/07/2016, 11:48 AM

## 2016-09-07 NOTE — Discharge Summary (Signed)
Physician Discharge Summary  Patient ID: Eduardo Stewart MRN: [id number] DOB/AGE: [birth year] 51 y.o.  Admit date: 09/06/2016 Discharge date: 09/07/16  Admission Diagnoses: LEFT HAND LACERATION Past Medical History:  Diagnosis Date  . Anemia   . Asthma    as a child  . CHF (congestive heart failure) ([place])   . Chronic kidney disease    stage 2  . Coronary artery disease   . Depression   . Diabetes mellitus without complication (HCC)    Type 2  . Diabetic neuropathy ([place])   . Dyslipidemia   . GERD (gastroesophageal reflux disease)   . Headache   . Hypertension   . Myocardial infarction   . Osteomyelitis ([place])   . Pneumonia   . Sleep apnea    Does not wear CPAP    Discharge Diagnoses:  Active Problems:   * No active hospital problems. *   Surgeries: Procedure(s): IRRIGATION AND DEBRIDEMENT OF LEFT HAND WITH REPAIR OF TENDON, NERVE AND ARTERY AS NECESSARY on 09/06/2016    Consultants:  none  Discharged Condition: Improved  Hospital Course: Eduardo Stewart is an 51 y.o. male who was admitted 09/06/2016 with a chief complaint of No chief complaint on file. , and found to have a diagnosis of LEFT HAND LACERATION.  They were brought to the operating room on 09/06/2016 and underwent Procedure(s): IRRIGATION AND DEBRIDEMENT OF LEFT HAND WITH REPAIR OF TENDON, NERVE AND ARTERY AS NECESSARY.  The patient tolerated the procedure well, postoperative day #1 he was doing well without significant pain or complaints. However, he was having difficulties with his blood glucose levels. He states that typically his glucose runs between 200- 250's on a home basis. I have discussed with him the need to follow up with his family physician for better control. During the course of his hospital stay was on a sliding scale. Once the patient was tolerating a regular diet his home insulin regime was implemented. EValuation of the upper extremity showed that his dressings were clean dry and intact. I have  discussed with him following up in our office setting in 10-12 days for a recheck. He understands he will keep his dressings clean dry and intact and will not remove these. He will elevate the extremity frequently.  They were given perioperative antibiotics: Anti-infectives    Start     Dose/Rate Route Frequency Ordered Stop   09/07/16 0300  clindamycin (CLEOCIN) IVPB 900 mg     900 mg 100 mL/hr over 30 Minutes Intravenous Every 8 hours 09/06/16 2149     09/07/16 0000  clindamycin (CLEOCIN) 300 MG capsule     300 mg Oral 3 times daily 09/07/16 1001     09/06/16 1745  clindamycin (CLEOCIN) IVPB 600 mg     600 mg 100 mL/hr over 30 Minutes Intravenous  Once 09/06/16 1731 09/06/16 1931   09/06/16 1732  clindamycin (CLEOCIN) 600 MG/50ML IVPB    Comments:  Schonewitz, Leigh   : cabinet override      09/06/16 1732 09/06/16 1906   09/06/16 1730  clindamycin (CLEOCIN) 400 mg in dextrose 5 % 50 mL IVPB  Status:  Discontinued     400 mg 105.3 mL/hr over 30 Minutes Intravenous  Once 09/06/16 1726 09/06/16 1731   09/06/16 0730  vancomycin (VANCOCIN) 500 mg in sodium chloride 0.9 % 100 mL IVPB  Status:  Discontinued    Comments:  Vancomycin dose per pharmacy consult,  Creatine 2.3 pcp Cleda Mccreedy M.D. [address]  [address] [place] ,[id number] [phone]   500 mg 100 mL/hr over 60 Minutes Intravenous To [facility] 09/05/16 1027 09/06/16 1727    .  They were given sequential compression devices, early ambulation for DVT prophylaxis.  Recent vital signs: Patient Vitals for the past 24 hrs:  BP Temp Temp src Pulse Resp SpO2 Height Weight  09/07/16 0540 (!) 145/75 98.5 F (36.9 C) Oral 85 15 94 % - -  09/07/16 0156 128/73 97.7 F (36.5 C) Oral 81 14 100 % - -  09/06/16 2145 125/78 97.8 F (36.6 C) Oral 82 16 98 % - -  09/06/16 2127 120/79 - - 80 15 99 % - -  09/06/16 2115 103/72 - - 79 13 95 % - -  09/06/16 2100 113/75 - - - 16 - - -  09/06/16 2045 108/80 - - - - - - -  09/06/16  2030 104/77 - - - - - - -  09/06/16 2026 112/81 97.2 F (36.2 C) - 81 16 100 % - -  09/06/16 1646 104/72 99.3 F (37.4 C) Oral 82 18 100 % - -  09/06/16 1637 - - - - - - 5\' 9"  (1.753 m) 90.7 kg (200 lb)  .  Recent laboratory studies: No results found.  Discharge Medications:   Allergies as of 09/07/2016      Reactions   Linezolid Other (See Comments)   Pancytopenia Dec 2016   Penicillins Anaphylaxis, Hives, Shortness Of Breath   Tolerated Zosyn 05/16/2013 Unknown. Has patient had a PCN reaction causing immediate rash, facial/tongue/throat swelling, SOB or lightheadedness with hypotension: UNSPECIFIED  Has patient had a PCN reaction causing severe rash involving mucus membranes or skin necrosis: UNSPECIFIED  Has patient had a PCN reaction that required hospitalization UNSPECIFIED  Has patient had a PCN reaction occurring within the last 10 years: #  #  #  YES  #  #  #    Vancomycin Other (See Comments)   NEPHROTOXICITY 2015 UNSPECIFIED DEGREE OF TOXICITY      Medication List    TAKE these medications   aspirin EC 81 MG tablet Take 81 mg by mouth daily.   atorvastatin 40 MG tablet Commonly known as:  LIPITOR Take 40 mg by mouth at bedtime.   clindamycin 300 MG capsule Commonly known as:  CLEOCIN Take 1 capsule (300 mg total) by mouth 3 (three) times daily. What changed:  when to take this   DULoxetine 60 MG capsule Commonly known as:  CYMBALTA Take 60 mg by mouth 2 (two) times daily.   ferrous sulfate 325 (65 FE) MG tablet Take 325 mg by mouth daily with breakfast.   FLUoxetine 40 MG capsule Commonly known as:  PROZAC Take 40 mg by mouth daily.   gabapentin 300 MG capsule Commonly known as:  NEURONTIN Take 600-900 mg by mouth 2 (two) times daily. Take 600mg s with breakfast and 900mg s at night   HUMALOG KWIKPEN 100 UNIT/ML KiwkPen Generic drug:  insulin lispro Inject 5-15 Units into the skin. Always inject 5 units 3 times daily, after 2 hours of each dose check  blood sugar and add an additional # of units using sliding scale  140 or below = 0 units, 141-181  = 2 units, 182 -223= 4units, 223-365= 8 units 365 or above = 10 units   isosorbide mononitrate 60 MG 24 hr tablet Commonly known as:  IMDUR Take 60 mg by mouth daily.   loperamide 2 MG capsule Commonly known as:  IMODIUM  Take 1 capsule (2 mg total) by mouth 3 (three) times daily as needed for diarrhea or loose stools.   omega-3 acid ethyl esters 1 g capsule Commonly known as:  LOVAZA Take 1 g by mouth 2 (two) times daily.   oxyCODONE 5 MG immediate release tablet Commonly known as:  Oxy IR/ROXICODONE Take 1 tablet (5 mg total) by mouth every 4 (four) hours as needed for moderate pain. What changed:  when to take this  reasons to take this   pantoprazole 40 MG tablet Commonly known as:  PROTONIX Take 40 mg by mouth daily.   pregabalin 200 MG capsule Commonly known as:  LYRICA Take 200 mg by mouth every 12 (twelve) hours.   ranolazine 500 MG 12 hr tablet Commonly known as:  RANEXA Take 500 mg by mouth 2 (two) times daily.   rivaroxaban 20 MG Tabs tablet Commonly known as:  XARELTO Take 20 mg by mouth every morning.   TRESIBA FLEXTOUCH 100 UNIT/ML Sopn FlexTouch Pen Generic drug:  insulin degludec Inject 60 Units into the skin daily after breakfast.   vitamin B-12 500 MCG tablet Commonly known as:  CYANOCOBALAMIN Take 500 mcg by mouth daily.       Diagnostic Studies: No results found.  They benefited maximally from their hospital stay and there were no complications.     Disposition: [facility] Discharge Instructions    Call MD / Call 911    Complete by:  As directed    If you experience chest pain or shortness of breath, CALL 911 and be transported to the hospital emergency room.  If you develope a fever above 101 F, pus (white drainage) or increased drainage or redness at the wound, or calf pain, call your surgeon's office.   Constipation Prevention     Complete by:  As directed    Drink plenty of fluids.  Prune juice may be helpful.  You may use a stool softener, such as Colace (over the counter) 100 mg twice a day.  Use MiraLax (over the counter) for constipation as needed.   Diet - low sodium heart healthy    Complete by:  As directed    Discharge instructions    Complete by:  As directed    We recommend that you to take vitamin C 1000 mg a day to promote healing. We also recommend that if you require  pain medicine that you take a stool softener to prevent constipation as most pain medicines will have constipation side effects. We recommend either Peri-Colace or Senokot and recommend that you also consider adding MiraLAX as well to prevent the constipation affects from pain medicine if you are required to use them. These medicines are over the counter and may be purchased at a local pharmacy. A cup of yogurt and a probiotic can also be helpful during the recovery process as the medicines can disrupt your intestinal environment. We recommend that you to take vitamin C 1000 mg a day to promote healing. We also recommend that if you require  pain medicine that you take a stool softener to prevent constipation as most pain medicines will have constipation side effects. We recommend either Peri-Colace or Senokot and recommend that you also consider adding MiraLAX as well to prevent the constipation affects from pain medicine if you are required to use them. These medicines are over the counter and may be purchased at a local pharmacy. A cup of yogurt and a probiotic can also be helpful during  the recovery process as the medicines can disrupt your intestinal environment.   Increase activity slowly as tolerated    Complete by:  As directed      Follow-up Information    Paulene Floor, MD Follow up.   Specialty:  Orthopedic Surgery Contact information: [address] [place] [address] [phone]             Signed: Ivan Croft 09/07/2016, 10:03 AM

## 2016-09-07 NOTE — Discharge Instructions (Signed)
We recommend that you to take vitamin C 1000 mg a day to promote healing. We also recommend that if you require  pain medicine that you take a stool softener to prevent constipation as most pain medicines will have constipation side effects. We recommend either Peri-Colace or Senokot and recommend that you also consider adding MiraLAX as well to prevent the constipation affects from pain medicine if you are required to use them. These medicines are over the counter and may be purchased at a local pharmacy. A cup of yogurt and a probiotic can also be helpful during the recovery process as the medicines can disrupt your intestinal environment. Keep bandage clean and dry.  Call for any problems.  No smoking.  Criteria for driving a car: you should be off your pain medicine for 7-8 hours, able to drive one handed(confident), thinking clearly and feeling able in your judgement to drive. Continue elevation as it will decrease swelling.  If instructed by MD move your fingers within the confines of the bandage/splint.  Use ice if instructed by your MD. Call immediately for any sudden loss of feeling in your hand/arm or change in functional abilities of the extremity. Please resume home insulin as directed. For difficulties with blood glucose levels please contact your family physician for recommendations and management.

## 2016-09-07 NOTE — Consult Note (Signed)
Consultation Note   Eduardo Stewart ZOX:[id number] DOB: 16-Apr-1966 DOA: 09/06/2016   PCP: Cyndy Freeze, MD   Patient coming from/Resides with: Private residence/lives with spouse  Requesting physician: Dr. Amedeo Plenty  Reason for consultation: Uncontrolled diabetes  HPI: Eduardo Stewart is a 51 y.o. male with medical history significant for diabetes mellitus on insulin, obesity, PVD, normocytic anemia, CAD and chronic systolic congestive heart failure. She was admitted on 2/1 after experiencing a soft injury to the left thenar space and hand and underwent urgent surgical treatment. In the immediate preop as well as postoperative. His Lantus was held. His initial sugars were moderately controlled with ranges between 158 and 193 about early this morning were greater than 600. He has been receiving small doses of sliding scale insulin as well as meal coverage with sugars decreasing to 445 and peaking in at 536. The patient did receive initial postoperative dose of Lantus today at 12:30 but admits to also eating foods that would not achieve appropriate glycemic control throughout the day. Plans were to discharge home at the orthopedic team was concern regarding the poor glycemic control and requested internal medicine consultation.   Review of Systems:  In addition to the HPI above,  No Fever-chills, myalgias or other constitutional symptoms No Headache, changes with Vision or hearing, new weakness, tingling, numbness in any extremity, dizziness, dysarthria or word finding difficulty, gait disturbance or imbalance, tremors or seizure activity No problems swallowing food or Liquids, indigestion/reflux, choking or coughing while eating, abdominal pain with or after eating No Chest pain, Cough or Shortness of Breath, palpitations, orthopnea or DOE No Abdominal pain, N/V, melena,hematochezia, dark tarry stools, constipation No dysuria, malodorous urine, hematuria or flank pain No new skin rashes, lesions,  masses or bruises, No recent unintentional weight gain or loss No polyuria, polydypsia or polyphagia   Past Medical History:  Diagnosis Date  . Anemia   . Asthma    as a child  . CHF (congestive heart failure) ([place])   . Chronic kidney disease    stage 2  . Coronary artery disease   . Depression   . Diabetes mellitus without complication (HCC)    Type 2  . Diabetic neuropathy ([place])   . Dyslipidemia   . GERD (gastroesophageal reflux disease)   . Headache   . Hypertension   . Myocardial infarction   . Osteomyelitis ([place])   . Pneumonia   . Sleep apnea    Does not wear CPAP    Past Surgical History:  Procedure Laterality Date  . BELOW KNEE LEG AMPUTATION Right   . CARDIAC CATHETERIZATION    . CHOLECYSTECTOMY    . COLONOSCOPY W/ BIOPSIES AND POLYPECTOMY    . CORONARY ARTERY BYPASS GRAFT    . EYE SURGERY     laser  . HERNIA REPAIR    . I & D of left hand  09/06/2016  . INCISION AND DRAINAGE OF WOUND Left 09/06/2016   Procedure: IRRIGATION AND DEBRIDEMENT OF LEFT HAND WITH REPAIR OF TENDON, NERVE AND ARTERY AS NECESSARY;  Surgeon: Roseanne Kaufman, MD;  Location: [place];  Service: Orthopedics;  Laterality: Left;    Social History   Social History  . Marital status: Married    Spouse name: N/A  . Number of children: N/A  . Years of education: N/A   Occupational History  . Not on file.   Social History Main Topics  . Smoking status: Current Some Day Smoker    Types: Cigarettes  . Smokeless tobacco:  Never Used  . Alcohol use No  . Drug use: No  . Sexual activity: Not on file   Other Topics Concern  . Not on file   Social History Narrative  . No narrative on file    Mobility: Artificial limb right lower extremity Work history: Disabled   Allergies  Allergen Reactions  . Linezolid Other (See Comments)    Pancytopenia Dec 2016  . Penicillins Anaphylaxis, Hives and Shortness Of Breath    Tolerated Zosyn 05/16/2013  Unknown. Has patient had a PCN reaction  causing immediate rash, facial/tongue/throat swelling, SOB or lightheadedness with hypotension: UNSPECIFIED  Has patient had a PCN reaction causing severe rash involving mucus membranes or skin necrosis: UNSPECIFIED  Has patient had a PCN reaction that required hospitalization UNSPECIFIED  Has patient had a PCN reaction occurring within the last 10 years: #  #  #  YES  #  #  #     . Vancomycin Other (See Comments)    NEPHROTOXICITY 2015 UNSPECIFIED DEGREE OF TOXICITY    Family History  Problem Relation Age of Onset  . Diabetes Mother   . Stroke Mother   . Diabetes Father   . Colon cancer Father   . Stroke Father   . Congestive Heart Failure Father   . Hypertension Father      Prior to Admission medications   Medication Sig Start Date End Date Taking? Authorizing Provider  aspirin EC 81 MG tablet Take 81 mg by mouth daily.   Yes Historical Provider, MD  atorvastatin (LIPITOR) 40 MG tablet Take 40 mg by mouth at bedtime.   Yes Historical Provider, MD  clindamycin (CLEOCIN) 300 MG capsule Take 300 mg by mouth 4 (four) times daily.   Yes Historical Provider, MD  DULoxetine (CYMBALTA) 60 MG capsule Take 60 mg by mouth 2 (two) times daily.   Yes Historical Provider, MD  ferrous sulfate 325 (65 FE) MG tablet Take 325 mg by mouth daily with breakfast.   Yes Historical Provider, MD  FLUoxetine (PROZAC) 40 MG capsule Take 40 mg by mouth daily.   Yes Historical Provider, MD  gabapentin (NEURONTIN) 300 MG capsule Take 600-900 mg by mouth 2 (two) times daily. Take 600mg s with breakfast and 900mg s at night   Yes Historical Provider, MD  insulin degludec (TRESIBA FLEXTOUCH) 100 UNIT/ML SOPN FlexTouch Pen Inject 60 Units into the skin daily after breakfast.    Yes Historical Provider, MD  insulin lispro (HUMALOG KWIKPEN) 100 UNIT/ML KiwkPen Inject 5-15 Units into the skin. Always inject 5 units 3 times daily, after 2 hours of each dose check blood sugar and add an additional # of units using sliding  scale  140 or below = 0 units, 141-181  = 2 units, 182 -223= 4units, 223-365= 8 units 365 or above = 10 units   Yes Historical Provider, MD  isosorbide mononitrate (IMDUR) 60 MG 24 hr tablet Take 60 mg by mouth daily.   Yes Historical Provider, MD  loperamide (IMODIUM) 2 MG capsule Take 1 capsule (2 mg total) by mouth 3 (three) times daily as needed for diarrhea or loose stools. 08/02/15  Yes Bonnielee Haff, MD  omega-3 acid ethyl esters (LOVAZA) 1 G capsule Take 1 g by mouth 2 (two) times daily.    Yes Historical Provider, MD  oxyCODONE (OXY IR/ROXICODONE) 5 MG immediate release tablet Take 5 mg by mouth every 6 (six) hours as needed for pain. 09/04/16  Yes Historical Provider, MD  pantoprazole (PROTONIX) 40 MG  tablet Take 40 mg by mouth daily.   Yes Historical Provider, MD  pregabalin (LYRICA) 200 MG capsule Take 200 mg by mouth every 12 (twelve) hours.   Yes Historical Provider, MD  ranolazine (RANEXA) 500 MG 12 hr tablet Take 500 mg by mouth 2 (two) times daily.   Yes Historical Provider, MD  rivaroxaban (XARELTO) 20 MG TABS tablet Take 20 mg by mouth every morning.   Yes Historical Provider, MD  vitamin B-12 (CYANOCOBALAMIN) 500 MCG tablet Take 500 mcg by mouth daily.   Yes Historical Provider, MD  clindamycin (CLEOCIN) 300 MG capsule Take 1 capsule (300 mg total) by mouth 3 (three) times daily. 09/07/16   Avelina Laine, PA-C  oxyCODONE (OXY IR/ROXICODONE) 5 MG immediate release tablet Take 1 tablet (5 mg total) by mouth every 4 (four) hours as needed for moderate pain. 09/07/16   Avelina Laine, PA-C    Physical Exam: Vitals:   09/06/16 2127 09/06/16 2145 09/07/16 0156 09/07/16 0540  BP: 120/79 125/78 128/73 (!) 145/75  Pulse: 80 82 81 85  Resp: 15 16 14 15   Temp:  97.8 F (36.6 C) 97.7 F (36.5 C) 98.5 F (36.9 C)  TempSrc:  Oral Oral Oral  SpO2: 99% 98% 100% 94%  Weight:      Height:          Constitutional: NAD, calm, comfortable Eyes: PERRL, lids and conjunctivae normal ENMT:  Mucous membranes are moist. Posterior pharynx clear of any exudate or lesions.Normal dentition.  Neck: normal, supple, no masses, no thyromegaly Respiratory: clear to auscultation bilaterally, no wheezing, no crackles. Normal respiratory effort. No accessory muscle use.  Cardiovascular: Regular rate and rhythm, no murmurs / rubs / gallops. No extremity edema. 2+ pedal pulses. No carotid bruits.  Abdomen: no tenderness, no masses palpated. No hepatosplenomegaly. Bowel sounds positive.  Musculoskeletal: no clubbing / cyanosis. No joint deformity upper and lower extremities. Good ROM, no contractures. Normal muscle tone. Prior BKA right lower extremity. Cast in place to left hand and forearm Skin: no rashes, lesions, ulcers. No induration Neurologic: CN 2-12 grossly intact. Sensation intact, DTR normal. Strength 5/5 x all 4 extremities.  Psychiatric: Normal judgment and insight. Alert and oriented x 3. Normal mood.    Labs on Admission: I have personally reviewed following labs and imaging studies  CBC:  Recent Labs Lab 09/06/16 1715  WBC 7.4  HGB 12.6*  HCT 36.7*  MCV 83.4  PLT 676   Basic Metabolic Panel:  Recent Labs Lab 09/06/16 1715 09/07/16 0748  NA 134*  --   K 4.2  --   CL 97*  --   CO2 28  --   GLUCOSE 195* 479*  BUN 25*  --   CREATININE 1.55*  --   CALCIUM 9.0  --    GFR: Estimated Creatinine Clearance: 62.1 mL/min (by C-G formula based on SCr of 1.55 mg/dL (H)). Liver Function Tests: No results for input(s): AST, ALT, ALKPHOS, BILITOT, PROT, ALBUMIN in the last 168 hours. No results for input(s): LIPASE, AMYLASE in the last 168 hours. No results for input(s): AMMONIA in the last 168 hours. Coagulation Profile: No results for input(s): INR, PROTIME in the last 168 hours. Cardiac Enzymes: No results for input(s): CKTOTAL, CKMB, CKMBINDEX, TROPONINI in the last 168 hours. BNP (last 3 results) No results for input(s): PROBNP in the last 8760 hours. HbA1C: No  results for input(s): HGBA1C in the last 72 hours. CBG:  Recent Labs Lab 09/07/16 0727 09/07/16 0904 09/07/16 1148  09/07/16 1400 09/07/16 1445  GLUCAP >600* 445* 475* 536* 475*   Lipid Profile: No results for input(s): CHOL, HDL, LDLCALC, TRIG, CHOLHDL, LDLDIRECT in the last 72 hours. Thyroid Function Tests: No results for input(s): TSH, T4TOTAL, FREET4, T3FREE, THYROIDAB in the last 72 hours. Anemia Panel: No results for input(s): VITAMINB12, FOLATE, FERRITIN, TIBC, IRON, RETICCTPCT in the last 72 hours. Urine analysis:    Component Value Date/Time   COLORURINE YELLOW 07/29/2015 1949   APPEARANCEUR CLEAR 07/29/2015 1949   LABSPEC 1.015 07/29/2015 1949   PHURINE 5.0 07/29/2015 1949   GLUCOSEU NEGATIVE 07/29/2015 1949   HGBUR NEGATIVE 07/29/2015 1949   [place] NEGATIVE 07/29/2015 1949   KETONESUR NEGATIVE 07/29/2015 1949   PROTEINUR NEGATIVE 07/29/2015 1949   NITRITE NEGATIVE 07/29/2015 1949   LEUKOCYTESUR NEGATIVE 07/29/2015 1949   Sepsis Labs: @LABRCNTIP (procalcitonin:4,lacticidven:4) )No results found for this or any previous visit (from the past 240 hour(s)).   Radiological Exams on Admission: No results found.   Assessment/Plan Principal Problem:   Insulin dependent type 2 diabetes mellitus, uncontrolled  -Patient with poor glycemic control since early this morning with sugars as high as greater than 600 in setting of underdosing of home insulin regimen (has received a total of 26 units regular insulin and 60 units Lantus today) -Resumed Lantus at 12:30  -Typically utilizes 5 units of regular insulin for meal coverage -Admits to dietary nonadherence today contributing to hyperglycemia -Most recent CBG trending downward at 475 -Give 20 units subcutaneous regular insulin now and if CBG continues to trend down patient will be appropriate for discharge home -If CBG remains greater than 450 my recommendation will be to remain hospitalized at least for 4 hours to  monitor CBGs and administer normal saline fluids for a total of 500 mL -Patient encouraged to drink plenty of water over the next hour as well  -Above plan discussed with patient and wife and they agree but are reluctant to stay if that is necessary -follow CBGs at home and give SSI as needed  **Rpt CBG down to 413- Avelina Laine, PA-C w/ ortho made aware pt OK to dc home  Active Problems:   Laceration of left hand -From an operative standpoint attending orthopedist to stand patient appropriate for discharge home          Giulio Bertino L. ANP-BC [facility] Pager [phone]   If 7PM-7AM, please contact night-coverage [email or web address] Password [facility]  09/07/2016, 2:53 PM

## 2016-09-07 NOTE — Op Note (Signed)
Eduardo Stewart, GOTTWALD NO.:  [id number]  MEDICAL RECORD NO.:  [id number]  LOCATION:  [facility]                         FACILITY:  [place]  PHYSICIAN:  Satira Anis. Demarkus Remmel, M.D.DATE OF BIRTH:  Dec 29, 1965  DATE OF PROCEDURE:09/06/2016 DATE OF DISCHARGE:TBD                              OPERATIVE REPORT   PREOPERATIVE DIAGNOSIS:  Saw injury left hand thumb and thenar space with involvement of tendon nerve and arterial structures as well as deep muscle and periosteal involvement.  POSTOPERATIVE DIAGNOSIS:  Saw injury left hand thumb and thenar space with involvement of tendon nerve and arterial structures as well as deep muscle and periosteal involvement.  PROCEDURE: 1. Irrigation and debridement of skin, subcutaneous tissue, bone,     tendon, muscle, and associated soft tissues.  This was an     excisional debridement with curette, knife, blade, and scissor. 2. Adductor pollicis brevis repair reconstruction (tendon repair left     thumb hand). 3. Flexor pollicis longus tendon repair in zone 2. 4. A1 pulley release, left thumb. 5. Radial digital nerve repair, left thumb. 6. Ulnar digital nerve neurolysis, left thumb. 7. Complex wound repair, 10 cm left thumb.  SURGEON:  Satira Anis. Amedeo Plenty, M.D.  ASSISTANT:  None.  COMPLICATIONS:  None.  ANESTHESIA:  General.  TOURNIQUET TIME:  Less than an hour.  INDICATIONS:  This is a 51 year old male, who appears older than his stated age and has multiple medical problems.  He has a saw injury.  I have discussed the risks and benefits of surgery.  Do's and don'ts, etc. with this in mind, he desires to proceed.  I have counseled he and his family.  His primary care physician and I discussed his upper extremity predicament implants.  OPERATION IN DETAIL:  The patient was seen by myself and Anesthesia, taken to the operative theater and underwent smooth induction of general anesthetic.  He was laid supine, appropriately padded,  time-out was called.  He underwent a Hibiclens scrub by myself, followed by 10 minutes surgical Betadine scrub and paint.  Once this was complete, I then performed very careful and cautious approach to the thumb with. Irrigation and debridement of skin, subcutaneous tissue, muscle, tendon, periosteal tissue, and neurovascular tissue.  Excisional debridement was accomplished with curette, knife, blade, and scissor for a depth of 3 cm and a width of 10 cm.  The patient tolerated this well.  A 3 L of saline were placed through and through the wounds and exploration was accomplished during this time.  At this juncture, I then changed callus and identified all structures. Microsurgical technique was used to perform an ulnar digital nerve neurolysis which was noted to reveal intact neural structure about the ulnar digital nerve.  The ulnar digital artery was also intact.  The radial digital nerve and the flexor pollicis longus had complete severance as did the adductor pollicis brevis.  At this time, I performed repair of the flexor pollicis longus with a 6 strand technique utilizing combination 3-0 FiberWire and 4-0 FiberWire. The patient tolerated this well.  Following this, I then performed an A1 pulley release to convert a zone 2 to zone 3 repair.  I kept the  oblique pulley intact so there would be no bowstringing.  Following this, I then performed radial digital nerve repair with 8-0 nylon and then placed a nerve wrap around the nerve of the neurosurgeon variety 3 mm in nature.  Following this, I performed an abductor pollicis brevis repair with FiberWire suture without difficulty, which patient tolerated well.  I was able to get a good sound fixation with the FiberWire.  Following this, with the tourniquet deflated, we irrigated additionally and then performed a complex closure with 4-0 and 3-0 Prolene.  The patient tolerated the procedure well.  There were no complicating  features.  He was dressed sterilely.  He was admitted for IV antibiotics, general postop observation, and other measures.  We are going to see him back in the office in approximately 10-14 days.  He will begin his anticoagulants tomorrow and notify should any problems occur.  These notes have been discussed.  All questions have been encouraged and answered.  It was a pleasure to see him today.  This was an uncomplicated surgical repair.  Going forward, all mobilizing for 4 weeks then begin interval active range of motion under the auspices of our therapy department.     Satira Anis. Amedeo Plenty, M.D.     [facility]  D:  09/06/2016  T:  09/07/2016  Job:  [id number]

## 2016-09-07 NOTE — Progress Notes (Signed)
Pt ready for discharge. Education/instructions reviewed with pt and spouse, and all questions/concerns addressed. IV removed and belongings gathered. Pt will be transported out via wheelchair to friend's vehicle.

## 2016-09-07 NOTE — Progress Notes (Signed)
Notified by NT that pt's CBG was over 600. Placed order for STAT lab draw per protocol, and called on-call number with notification. Received call back from Thurmond Butts to give pt 8 unit SSI now, call with result of lab draw, and then administer the 4 units of meal coverage after pt eats his breakfast. 8 units administered and pt updated of plan. Will continue to monitor

## 2016-09-08 LAB — HEMOGLOBIN A1C
Hgb A1c MFr Bld: 9.6 % — ABNORMAL HIGH (ref 4.8–5.6)
Mean Plasma Glucose: 229 mg/dL

## 2016-09-10 LAB — GLUCOSE, CAPILLARY: Glucose-Capillary: >600 mg/dL (ref 65–99)

## 2016-09-13 ENCOUNTER — Encounter (HOSPITAL_COMMUNITY): Payer: Self-pay | Admitting: Orthopedic Surgery

## 2016-09-20 DIAGNOSIS — S66821D Laceration of other specified muscles, fascia and tendons at wrist and hand level, right hand, subsequent encounter: Secondary | ICD-10-CM | POA: Diagnosis not present

## 2016-09-20 DIAGNOSIS — S61411D Laceration without foreign body of right hand, subsequent encounter: Secondary | ICD-10-CM | POA: Diagnosis not present

## 2016-10-03 DIAGNOSIS — I2782 Chronic pulmonary embolism: Secondary | ICD-10-CM | POA: Diagnosis not present

## 2016-10-31 DIAGNOSIS — I2782 Chronic pulmonary embolism: Secondary | ICD-10-CM | POA: Diagnosis not present

## 2016-12-01 DIAGNOSIS — I2782 Chronic pulmonary embolism: Secondary | ICD-10-CM | POA: Diagnosis not present

## 2016-12-31 DIAGNOSIS — I2782 Chronic pulmonary embolism: Secondary | ICD-10-CM | POA: Diagnosis not present

## 2017-01-07 NOTE — Addendum Note (Signed)
Addendum  created 01/07/17 1052 by Myrtie Soman, MD   Sign clinical note

## 2017-01-14 DIAGNOSIS — E1165 Type 2 diabetes mellitus with hyperglycemia: Secondary | ICD-10-CM | POA: Diagnosis not present

## 2017-01-14 DIAGNOSIS — E1122 Type 2 diabetes mellitus with diabetic chronic kidney disease: Secondary | ICD-10-CM | POA: Diagnosis not present

## 2017-01-14 DIAGNOSIS — G629 Polyneuropathy, unspecified: Secondary | ICD-10-CM | POA: Diagnosis not present

## 2017-01-14 DIAGNOSIS — N183 Chronic kidney disease, stage 3 (moderate): Secondary | ICD-10-CM | POA: Diagnosis not present

## 2017-01-14 DIAGNOSIS — Z794 Long term (current) use of insulin: Secondary | ICD-10-CM | POA: Diagnosis not present

## 2017-01-31 DIAGNOSIS — I2782 Chronic pulmonary embolism: Secondary | ICD-10-CM | POA: Diagnosis not present

## 2017-02-13 DIAGNOSIS — E11649 Type 2 diabetes mellitus with hypoglycemia without coma: Secondary | ICD-10-CM | POA: Diagnosis not present

## 2017-02-13 DIAGNOSIS — I959 Hypotension, unspecified: Secondary | ICD-10-CM | POA: Diagnosis not present

## 2017-02-13 DIAGNOSIS — Z86711 Personal history of pulmonary embolism: Secondary | ICD-10-CM | POA: Diagnosis not present

## 2017-02-13 DIAGNOSIS — Z89511 Acquired absence of right leg below knee: Secondary | ICD-10-CM | POA: Diagnosis not present

## 2017-02-13 DIAGNOSIS — D6959 Other secondary thrombocytopenia: Secondary | ICD-10-CM | POA: Diagnosis not present

## 2017-02-13 DIAGNOSIS — Z79899 Other long term (current) drug therapy: Secondary | ICD-10-CM | POA: Diagnosis not present

## 2017-02-13 DIAGNOSIS — N179 Acute kidney failure, unspecified: Secondary | ICD-10-CM | POA: Diagnosis not present

## 2017-02-13 DIAGNOSIS — Z951 Presence of aortocoronary bypass graft: Secondary | ICD-10-CM | POA: Diagnosis not present

## 2017-02-13 DIAGNOSIS — Z888 Allergy status to other drugs, medicaments and biological substances status: Secondary | ICD-10-CM | POA: Diagnosis not present

## 2017-02-13 DIAGNOSIS — M7989 Other specified soft tissue disorders: Secondary | ICD-10-CM | POA: Diagnosis not present

## 2017-02-13 DIAGNOSIS — I130 Hypertensive heart and chronic kidney disease with heart failure and stage 1 through stage 4 chronic kidney disease, or unspecified chronic kidney disease: Secondary | ICD-10-CM | POA: Diagnosis not present

## 2017-02-13 DIAGNOSIS — Z7982 Long term (current) use of aspirin: Secondary | ICD-10-CM | POA: Diagnosis not present

## 2017-02-13 DIAGNOSIS — N182 Chronic kidney disease, stage 2 (mild): Secondary | ICD-10-CM | POA: Diagnosis not present

## 2017-02-13 DIAGNOSIS — S6992XA Unspecified injury of left wrist, hand and finger(s), initial encounter: Secondary | ICD-10-CM | POA: Diagnosis not present

## 2017-02-13 DIAGNOSIS — I509 Heart failure, unspecified: Secondary | ICD-10-CM | POA: Diagnosis not present

## 2017-02-13 DIAGNOSIS — Z794 Long term (current) use of insulin: Secondary | ICD-10-CM | POA: Diagnosis not present

## 2017-02-13 DIAGNOSIS — L03114 Cellulitis of left upper limb: Secondary | ICD-10-CM | POA: Diagnosis not present

## 2017-02-13 DIAGNOSIS — E871 Hypo-osmolality and hyponatremia: Secondary | ICD-10-CM | POA: Diagnosis not present

## 2017-02-13 DIAGNOSIS — S61402A Unspecified open wound of left hand, initial encounter: Secondary | ICD-10-CM | POA: Diagnosis not present

## 2017-02-13 DIAGNOSIS — Z880 Allergy status to penicillin: Secondary | ICD-10-CM | POA: Diagnosis not present

## 2017-02-13 DIAGNOSIS — D696 Thrombocytopenia, unspecified: Secondary | ICD-10-CM | POA: Diagnosis not present

## 2017-02-13 DIAGNOSIS — E11628 Type 2 diabetes mellitus with other skin complications: Secondary | ICD-10-CM | POA: Diagnosis not present

## 2017-02-13 DIAGNOSIS — J45909 Unspecified asthma, uncomplicated: Secondary | ICD-10-CM | POA: Diagnosis not present

## 2017-02-13 DIAGNOSIS — E1122 Type 2 diabetes mellitus with diabetic chronic kidney disease: Secondary | ICD-10-CM | POA: Diagnosis not present

## 2017-02-13 DIAGNOSIS — N183 Chronic kidney disease, stage 3 (moderate): Secondary | ICD-10-CM | POA: Diagnosis not present

## 2017-02-13 DIAGNOSIS — I2510 Atherosclerotic heart disease of native coronary artery without angina pectoris: Secondary | ICD-10-CM | POA: Diagnosis not present

## 2017-02-13 DIAGNOSIS — Z720 Tobacco use: Secondary | ICD-10-CM | POA: Diagnosis not present

## 2017-02-13 DIAGNOSIS — L03012 Cellulitis of left finger: Secondary | ICD-10-CM | POA: Diagnosis not present

## 2017-02-13 DIAGNOSIS — E1151 Type 2 diabetes mellitus with diabetic peripheral angiopathy without gangrene: Secondary | ICD-10-CM | POA: Diagnosis not present

## 2017-02-13 DIAGNOSIS — E1165 Type 2 diabetes mellitus with hyperglycemia: Secondary | ICD-10-CM | POA: Diagnosis not present

## 2017-02-27 DIAGNOSIS — N289 Disorder of kidney and ureter, unspecified: Secondary | ICD-10-CM | POA: Diagnosis not present

## 2017-02-27 DIAGNOSIS — E1165 Type 2 diabetes mellitus with hyperglycemia: Secondary | ICD-10-CM | POA: Diagnosis not present

## 2017-02-27 DIAGNOSIS — N179 Acute kidney failure, unspecified: Secondary | ICD-10-CM | POA: Diagnosis not present

## 2017-02-27 DIAGNOSIS — E1151 Type 2 diabetes mellitus with diabetic peripheral angiopathy without gangrene: Secondary | ICD-10-CM | POA: Diagnosis not present

## 2017-02-27 DIAGNOSIS — Z89511 Acquired absence of right leg below knee: Secondary | ICD-10-CM | POA: Diagnosis not present

## 2017-02-27 DIAGNOSIS — Z951 Presence of aortocoronary bypass graft: Secondary | ICD-10-CM | POA: Diagnosis not present

## 2017-02-27 DIAGNOSIS — I509 Heart failure, unspecified: Secondary | ICD-10-CM | POA: Diagnosis not present

## 2017-02-27 DIAGNOSIS — N189 Chronic kidney disease, unspecified: Secondary | ICD-10-CM | POA: Diagnosis not present

## 2017-02-27 DIAGNOSIS — L03114 Cellulitis of left upper limb: Secondary | ICD-10-CM | POA: Diagnosis not present

## 2017-02-27 DIAGNOSIS — S61402A Unspecified open wound of left hand, initial encounter: Secondary | ICD-10-CM | POA: Diagnosis not present

## 2017-02-27 DIAGNOSIS — Z880 Allergy status to penicillin: Secondary | ICD-10-CM | POA: Diagnosis not present

## 2017-02-27 DIAGNOSIS — A4902 Methicillin resistant Staphylococcus aureus infection, unspecified site: Secondary | ICD-10-CM | POA: Diagnosis not present

## 2017-02-27 DIAGNOSIS — Z86711 Personal history of pulmonary embolism: Secondary | ICD-10-CM | POA: Diagnosis not present

## 2017-02-27 DIAGNOSIS — Z7982 Long term (current) use of aspirin: Secondary | ICD-10-CM | POA: Diagnosis not present

## 2017-02-27 DIAGNOSIS — E1122 Type 2 diabetes mellitus with diabetic chronic kidney disease: Secondary | ICD-10-CM | POA: Diagnosis not present

## 2017-02-27 DIAGNOSIS — E1140 Type 2 diabetes mellitus with diabetic neuropathy, unspecified: Secondary | ICD-10-CM | POA: Diagnosis not present

## 2017-02-27 DIAGNOSIS — M609 Myositis, unspecified: Secondary | ICD-10-CM | POA: Diagnosis not present

## 2017-02-27 DIAGNOSIS — Z8614 Personal history of Methicillin resistant Staphylococcus aureus infection: Secondary | ICD-10-CM | POA: Diagnosis not present

## 2017-02-27 DIAGNOSIS — M7989 Other specified soft tissue disorders: Secondary | ICD-10-CM | POA: Diagnosis not present

## 2017-02-27 DIAGNOSIS — I2782 Chronic pulmonary embolism: Secondary | ICD-10-CM | POA: Diagnosis not present

## 2017-02-27 DIAGNOSIS — I130 Hypertensive heart and chronic kidney disease with heart failure and stage 1 through stage 4 chronic kidney disease, or unspecified chronic kidney disease: Secondary | ICD-10-CM | POA: Diagnosis not present

## 2017-02-27 DIAGNOSIS — N183 Chronic kidney disease, stage 3 (moderate): Secondary | ICD-10-CM | POA: Diagnosis not present

## 2017-02-27 DIAGNOSIS — Z7901 Long term (current) use of anticoagulants: Secondary | ICD-10-CM | POA: Diagnosis not present

## 2017-02-27 DIAGNOSIS — E1142 Type 2 diabetes mellitus with diabetic polyneuropathy: Secondary | ICD-10-CM | POA: Diagnosis not present

## 2017-02-27 DIAGNOSIS — I2510 Atherosclerotic heart disease of native coronary artery without angina pectoris: Secondary | ICD-10-CM | POA: Diagnosis not present

## 2017-02-27 DIAGNOSIS — Z79899 Other long term (current) drug therapy: Secondary | ICD-10-CM | POA: Diagnosis not present

## 2017-02-28 DIAGNOSIS — M7989 Other specified soft tissue disorders: Secondary | ICD-10-CM | POA: Diagnosis not present

## 2017-03-18 DIAGNOSIS — E1122 Type 2 diabetes mellitus with diabetic chronic kidney disease: Secondary | ICD-10-CM | POA: Diagnosis not present

## 2017-03-18 DIAGNOSIS — Z794 Long term (current) use of insulin: Secondary | ICD-10-CM | POA: Diagnosis not present

## 2017-03-18 DIAGNOSIS — I25810 Atherosclerosis of coronary artery bypass graft(s) without angina pectoris: Secondary | ICD-10-CM | POA: Diagnosis not present

## 2017-03-18 DIAGNOSIS — N183 Chronic kidney disease, stage 3 (moderate): Secondary | ICD-10-CM | POA: Diagnosis not present

## 2017-03-18 DIAGNOSIS — I739 Peripheral vascular disease, unspecified: Secondary | ICD-10-CM | POA: Diagnosis not present

## 2017-03-29 DIAGNOSIS — Z89511 Acquired absence of right leg below knee: Secondary | ICD-10-CM | POA: Diagnosis not present

## 2017-04-02 DIAGNOSIS — I2782 Chronic pulmonary embolism: Secondary | ICD-10-CM | POA: Diagnosis not present

## 2017-04-16 DIAGNOSIS — Z1159 Encounter for screening for other viral diseases: Secondary | ICD-10-CM | POA: Diagnosis not present

## 2017-04-16 DIAGNOSIS — N183 Chronic kidney disease, stage 3 (moderate): Secondary | ICD-10-CM | POA: Diagnosis not present

## 2017-04-16 DIAGNOSIS — Z794 Long term (current) use of insulin: Secondary | ICD-10-CM | POA: Diagnosis not present

## 2017-04-16 DIAGNOSIS — E1122 Type 2 diabetes mellitus with diabetic chronic kidney disease: Secondary | ICD-10-CM | POA: Diagnosis not present

## 2017-04-16 DIAGNOSIS — I110 Hypertensive heart disease with heart failure: Secondary | ICD-10-CM | POA: Diagnosis not present

## 2017-04-16 DIAGNOSIS — E782 Mixed hyperlipidemia: Secondary | ICD-10-CM | POA: Diagnosis not present

## 2017-04-16 DIAGNOSIS — Z79899 Other long term (current) drug therapy: Secondary | ICD-10-CM | POA: Diagnosis not present

## 2017-04-16 DIAGNOSIS — D510 Vitamin B12 deficiency anemia due to intrinsic factor deficiency: Secondary | ICD-10-CM | POA: Diagnosis not present

## 2017-05-03 DIAGNOSIS — I2782 Chronic pulmonary embolism: Secondary | ICD-10-CM | POA: Diagnosis not present

## 2017-05-10 DIAGNOSIS — N183 Chronic kidney disease, stage 3 (moderate): Secondary | ICD-10-CM | POA: Diagnosis not present

## 2017-05-10 DIAGNOSIS — K219 Gastro-esophageal reflux disease without esophagitis: Secondary | ICD-10-CM | POA: Diagnosis not present

## 2017-05-10 DIAGNOSIS — Z794 Long term (current) use of insulin: Secondary | ICD-10-CM | POA: Diagnosis not present

## 2017-05-10 DIAGNOSIS — Z89512 Acquired absence of left leg below knee: Secondary | ICD-10-CM | POA: Diagnosis not present

## 2017-05-10 DIAGNOSIS — E1151 Type 2 diabetes mellitus with diabetic peripheral angiopathy without gangrene: Secondary | ICD-10-CM | POA: Diagnosis not present

## 2017-05-10 DIAGNOSIS — Z9911 Dependence on respirator [ventilator] status: Secondary | ICD-10-CM | POA: Diagnosis not present

## 2017-05-10 DIAGNOSIS — Z89519 Acquired absence of unspecified leg below knee: Secondary | ICD-10-CM | POA: Diagnosis not present

## 2017-05-10 DIAGNOSIS — Z7901 Long term (current) use of anticoagulants: Secondary | ICD-10-CM | POA: Diagnosis not present

## 2017-05-10 DIAGNOSIS — I252 Old myocardial infarction: Secondary | ICD-10-CM | POA: Diagnosis not present

## 2017-05-10 DIAGNOSIS — Z89511 Acquired absence of right leg below knee: Secondary | ICD-10-CM | POA: Diagnosis not present

## 2017-05-10 DIAGNOSIS — R5383 Other fatigue: Secondary | ICD-10-CM | POA: Diagnosis not present

## 2017-05-10 DIAGNOSIS — I25810 Atherosclerosis of coronary artery bypass graft(s) without angina pectoris: Secondary | ICD-10-CM | POA: Diagnosis not present

## 2017-05-10 DIAGNOSIS — Z833 Family history of diabetes mellitus: Secondary | ICD-10-CM | POA: Diagnosis not present

## 2017-05-10 DIAGNOSIS — I348 Other nonrheumatic mitral valve disorders: Secondary | ICD-10-CM | POA: Diagnosis not present

## 2017-05-10 DIAGNOSIS — E1122 Type 2 diabetes mellitus with diabetic chronic kidney disease: Secondary | ICD-10-CM | POA: Diagnosis not present

## 2017-05-10 DIAGNOSIS — E1142 Type 2 diabetes mellitus with diabetic polyneuropathy: Secondary | ICD-10-CM | POA: Diagnosis not present

## 2017-05-10 DIAGNOSIS — Z86711 Personal history of pulmonary embolism: Secondary | ICD-10-CM | POA: Diagnosis not present

## 2017-05-10 DIAGNOSIS — I358 Other nonrheumatic aortic valve disorders: Secondary | ICD-10-CM | POA: Diagnosis not present

## 2017-05-10 DIAGNOSIS — N179 Acute kidney failure, unspecified: Secondary | ICD-10-CM | POA: Diagnosis not present

## 2017-05-10 DIAGNOSIS — Z951 Presence of aortocoronary bypass graft: Secondary | ICD-10-CM | POA: Diagnosis not present

## 2017-05-10 DIAGNOSIS — I6523 Occlusion and stenosis of bilateral carotid arteries: Secondary | ICD-10-CM | POA: Diagnosis not present

## 2017-05-10 DIAGNOSIS — Z9049 Acquired absence of other specified parts of digestive tract: Secondary | ICD-10-CM | POA: Diagnosis not present

## 2017-05-10 DIAGNOSIS — R9431 Abnormal electrocardiogram [ECG] [EKG]: Secondary | ICD-10-CM | POA: Diagnosis not present

## 2017-05-10 DIAGNOSIS — Z7982 Long term (current) use of aspirin: Secondary | ICD-10-CM | POA: Diagnosis not present

## 2017-05-10 DIAGNOSIS — I5022 Chronic systolic (congestive) heart failure: Secondary | ICD-10-CM | POA: Diagnosis not present

## 2017-05-10 DIAGNOSIS — R531 Weakness: Secondary | ICD-10-CM | POA: Diagnosis not present

## 2017-05-10 DIAGNOSIS — N400 Enlarged prostate without lower urinary tract symptoms: Secondary | ICD-10-CM | POA: Diagnosis not present

## 2017-05-10 DIAGNOSIS — E781 Pure hyperglyceridemia: Secondary | ICD-10-CM | POA: Diagnosis not present

## 2017-05-10 DIAGNOSIS — Z87891 Personal history of nicotine dependence: Secondary | ICD-10-CM | POA: Diagnosis not present

## 2017-05-10 DIAGNOSIS — R55 Syncope and collapse: Secondary | ICD-10-CM | POA: Diagnosis not present

## 2017-05-10 DIAGNOSIS — R2689 Other abnormalities of gait and mobility: Secondary | ICD-10-CM | POA: Diagnosis not present

## 2017-05-10 DIAGNOSIS — E860 Dehydration: Secondary | ICD-10-CM | POA: Diagnosis not present

## 2017-05-10 DIAGNOSIS — I130 Hypertensive heart and chronic kidney disease with heart failure and stage 1 through stage 4 chronic kidney disease, or unspecified chronic kidney disease: Secondary | ICD-10-CM | POA: Diagnosis not present

## 2017-05-10 DIAGNOSIS — I951 Orthostatic hypotension: Secondary | ICD-10-CM | POA: Diagnosis not present

## 2017-05-10 DIAGNOSIS — E785 Hyperlipidemia, unspecified: Secondary | ICD-10-CM | POA: Diagnosis not present

## 2017-05-10 DIAGNOSIS — I2510 Atherosclerotic heart disease of native coronary artery without angina pectoris: Secondary | ICD-10-CM | POA: Diagnosis not present

## 2017-05-11 DIAGNOSIS — R55 Syncope and collapse: Secondary | ICD-10-CM | POA: Diagnosis not present

## 2017-05-11 DIAGNOSIS — Z89512 Acquired absence of left leg below knee: Secondary | ICD-10-CM | POA: Diagnosis not present

## 2017-05-11 DIAGNOSIS — I951 Orthostatic hypotension: Secondary | ICD-10-CM | POA: Diagnosis not present

## 2017-05-11 DIAGNOSIS — E1151 Type 2 diabetes mellitus with diabetic peripheral angiopathy without gangrene: Secondary | ICD-10-CM | POA: Diagnosis not present

## 2017-05-11 DIAGNOSIS — I358 Other nonrheumatic aortic valve disorders: Secondary | ICD-10-CM | POA: Diagnosis not present

## 2017-05-11 DIAGNOSIS — Z89511 Acquired absence of right leg below knee: Secondary | ICD-10-CM | POA: Diagnosis not present

## 2017-05-11 DIAGNOSIS — I348 Other nonrheumatic mitral valve disorders: Secondary | ICD-10-CM | POA: Diagnosis not present

## 2017-05-11 DIAGNOSIS — I6523 Occlusion and stenosis of bilateral carotid arteries: Secondary | ICD-10-CM | POA: Diagnosis not present

## 2017-05-12 DIAGNOSIS — Z89512 Acquired absence of left leg below knee: Secondary | ICD-10-CM | POA: Diagnosis not present

## 2017-05-12 DIAGNOSIS — Z89511 Acquired absence of right leg below knee: Secondary | ICD-10-CM | POA: Diagnosis not present

## 2017-05-12 DIAGNOSIS — E1142 Type 2 diabetes mellitus with diabetic polyneuropathy: Secondary | ICD-10-CM | POA: Diagnosis not present

## 2017-05-12 DIAGNOSIS — R55 Syncope and collapse: Secondary | ICD-10-CM | POA: Diagnosis not present

## 2017-05-12 DIAGNOSIS — I951 Orthostatic hypotension: Secondary | ICD-10-CM | POA: Diagnosis not present

## 2017-06-02 DIAGNOSIS — I2782 Chronic pulmonary embolism: Secondary | ICD-10-CM | POA: Diagnosis not present

## 2017-06-04 DIAGNOSIS — E782 Mixed hyperlipidemia: Secondary | ICD-10-CM | POA: Diagnosis not present

## 2017-06-04 DIAGNOSIS — N183 Chronic kidney disease, stage 3 (moderate): Secondary | ICD-10-CM | POA: Diagnosis not present

## 2017-06-04 DIAGNOSIS — Z794 Long term (current) use of insulin: Secondary | ICD-10-CM | POA: Diagnosis not present

## 2017-06-04 DIAGNOSIS — E1122 Type 2 diabetes mellitus with diabetic chronic kidney disease: Secondary | ICD-10-CM | POA: Diagnosis not present

## 2017-06-12 DIAGNOSIS — D510 Vitamin B12 deficiency anemia due to intrinsic factor deficiency: Secondary | ICD-10-CM | POA: Diagnosis not present

## 2017-06-12 DIAGNOSIS — E1122 Type 2 diabetes mellitus with diabetic chronic kidney disease: Secondary | ICD-10-CM | POA: Diagnosis not present

## 2017-06-12 DIAGNOSIS — E782 Mixed hyperlipidemia: Secondary | ICD-10-CM | POA: Diagnosis not present

## 2017-06-12 DIAGNOSIS — Z79899 Other long term (current) drug therapy: Secondary | ICD-10-CM | POA: Diagnosis not present

## 2017-07-03 DIAGNOSIS — I2782 Chronic pulmonary embolism: Secondary | ICD-10-CM | POA: Diagnosis not present

## 2017-07-17 DIAGNOSIS — S80211A Abrasion, right knee, initial encounter: Secondary | ICD-10-CM | POA: Diagnosis not present

## 2017-07-17 DIAGNOSIS — N179 Acute kidney failure, unspecified: Secondary | ICD-10-CM | POA: Diagnosis not present

## 2017-07-17 DIAGNOSIS — N183 Chronic kidney disease, stage 3 (moderate): Secondary | ICD-10-CM | POA: Diagnosis not present

## 2017-07-17 DIAGNOSIS — N170 Acute kidney failure with tubular necrosis: Secondary | ICD-10-CM | POA: Diagnosis not present

## 2017-07-17 DIAGNOSIS — M25561 Pain in right knee: Secondary | ICD-10-CM | POA: Diagnosis not present

## 2017-07-17 DIAGNOSIS — I129 Hypertensive chronic kidney disease with stage 1 through stage 4 chronic kidney disease, or unspecified chronic kidney disease: Secondary | ICD-10-CM | POA: Diagnosis not present

## 2017-07-17 DIAGNOSIS — Y999 Unspecified external cause status: Secondary | ICD-10-CM | POA: Diagnosis not present

## 2017-07-17 DIAGNOSIS — W010XXA Fall on same level from slipping, tripping and stumbling without subsequent striking against object, initial encounter: Secondary | ICD-10-CM | POA: Diagnosis not present

## 2017-07-17 DIAGNOSIS — E0822 Diabetes mellitus due to underlying condition with diabetic chronic kidney disease: Secondary | ICD-10-CM | POA: Diagnosis not present

## 2017-07-17 DIAGNOSIS — S81001A Unspecified open wound, right knee, initial encounter: Secondary | ICD-10-CM | POA: Diagnosis not present

## 2017-07-19 DIAGNOSIS — Z794 Long term (current) use of insulin: Secondary | ICD-10-CM | POA: Diagnosis not present

## 2017-07-19 DIAGNOSIS — S80811A Abrasion, right lower leg, initial encounter: Secondary | ICD-10-CM | POA: Diagnosis not present

## 2017-07-19 DIAGNOSIS — E1122 Type 2 diabetes mellitus with diabetic chronic kidney disease: Secondary | ICD-10-CM | POA: Diagnosis not present

## 2017-07-19 DIAGNOSIS — L89891 Pressure ulcer of other site, stage 1: Secondary | ICD-10-CM | POA: Diagnosis not present

## 2017-07-19 DIAGNOSIS — N183 Chronic kidney disease, stage 3 (moderate): Secondary | ICD-10-CM | POA: Diagnosis not present

## 2017-07-21 DIAGNOSIS — Z7901 Long term (current) use of anticoagulants: Secondary | ICD-10-CM | POA: Diagnosis not present

## 2017-07-21 DIAGNOSIS — I130 Hypertensive heart and chronic kidney disease with heart failure and stage 1 through stage 4 chronic kidney disease, or unspecified chronic kidney disease: Secondary | ICD-10-CM | POA: Diagnosis not present

## 2017-07-21 DIAGNOSIS — N179 Acute kidney failure, unspecified: Secondary | ICD-10-CM | POA: Diagnosis not present

## 2017-07-21 DIAGNOSIS — Z794 Long term (current) use of insulin: Secondary | ICD-10-CM | POA: Diagnosis not present

## 2017-07-21 DIAGNOSIS — N183 Chronic kidney disease, stage 3 (moderate): Secondary | ICD-10-CM | POA: Diagnosis not present

## 2017-07-21 DIAGNOSIS — Z87891 Personal history of nicotine dependence: Secondary | ICD-10-CM | POA: Diagnosis not present

## 2017-07-21 DIAGNOSIS — Z86711 Personal history of pulmonary embolism: Secondary | ICD-10-CM | POA: Diagnosis not present

## 2017-07-21 DIAGNOSIS — S81001D Unspecified open wound, right knee, subsequent encounter: Secondary | ICD-10-CM | POA: Diagnosis not present

## 2017-07-21 DIAGNOSIS — D510 Vitamin B12 deficiency anemia due to intrinsic factor deficiency: Secondary | ICD-10-CM | POA: Diagnosis not present

## 2017-07-21 DIAGNOSIS — E1142 Type 2 diabetes mellitus with diabetic polyneuropathy: Secondary | ICD-10-CM | POA: Diagnosis not present

## 2017-07-21 DIAGNOSIS — S81801D Unspecified open wound, right lower leg, subsequent encounter: Secondary | ICD-10-CM | POA: Diagnosis not present

## 2017-07-21 DIAGNOSIS — E1122 Type 2 diabetes mellitus with diabetic chronic kidney disease: Secondary | ICD-10-CM | POA: Diagnosis not present

## 2017-07-21 DIAGNOSIS — Z9981 Dependence on supplemental oxygen: Secondary | ICD-10-CM | POA: Diagnosis not present

## 2017-07-21 DIAGNOSIS — E1151 Type 2 diabetes mellitus with diabetic peripheral angiopathy without gangrene: Secondary | ICD-10-CM | POA: Diagnosis not present

## 2017-07-21 DIAGNOSIS — I5022 Chronic systolic (congestive) heart failure: Secondary | ICD-10-CM | POA: Diagnosis not present

## 2017-07-21 DIAGNOSIS — I25810 Atherosclerosis of coronary artery bypass graft(s) without angina pectoris: Secondary | ICD-10-CM | POA: Diagnosis not present

## 2017-07-21 DIAGNOSIS — E1165 Type 2 diabetes mellitus with hyperglycemia: Secondary | ICD-10-CM | POA: Diagnosis not present

## 2017-07-21 DIAGNOSIS — Z89511 Acquired absence of right leg below knee: Secondary | ICD-10-CM | POA: Diagnosis not present

## 2017-07-23 DIAGNOSIS — D510 Vitamin B12 deficiency anemia due to intrinsic factor deficiency: Secondary | ICD-10-CM | POA: Diagnosis not present

## 2017-07-23 DIAGNOSIS — N179 Acute kidney failure, unspecified: Secondary | ICD-10-CM | POA: Diagnosis not present

## 2017-07-23 DIAGNOSIS — I25810 Atherosclerosis of coronary artery bypass graft(s) without angina pectoris: Secondary | ICD-10-CM | POA: Diagnosis not present

## 2017-07-23 DIAGNOSIS — E1151 Type 2 diabetes mellitus with diabetic peripheral angiopathy without gangrene: Secondary | ICD-10-CM | POA: Diagnosis not present

## 2017-07-23 DIAGNOSIS — Z87891 Personal history of nicotine dependence: Secondary | ICD-10-CM | POA: Diagnosis not present

## 2017-07-23 DIAGNOSIS — N183 Chronic kidney disease, stage 3 (moderate): Secondary | ICD-10-CM | POA: Diagnosis not present

## 2017-07-23 DIAGNOSIS — Z7901 Long term (current) use of anticoagulants: Secondary | ICD-10-CM | POA: Diagnosis not present

## 2017-07-23 DIAGNOSIS — E1142 Type 2 diabetes mellitus with diabetic polyneuropathy: Secondary | ICD-10-CM | POA: Diagnosis not present

## 2017-07-23 DIAGNOSIS — Z9981 Dependence on supplemental oxygen: Secondary | ICD-10-CM | POA: Diagnosis not present

## 2017-07-23 DIAGNOSIS — I5022 Chronic systolic (congestive) heart failure: Secondary | ICD-10-CM | POA: Diagnosis not present

## 2017-07-23 DIAGNOSIS — I130 Hypertensive heart and chronic kidney disease with heart failure and stage 1 through stage 4 chronic kidney disease, or unspecified chronic kidney disease: Secondary | ICD-10-CM | POA: Diagnosis not present

## 2017-07-23 DIAGNOSIS — S81001D Unspecified open wound, right knee, subsequent encounter: Secondary | ICD-10-CM | POA: Diagnosis not present

## 2017-07-23 DIAGNOSIS — E1165 Type 2 diabetes mellitus with hyperglycemia: Secondary | ICD-10-CM | POA: Diagnosis not present

## 2017-07-23 DIAGNOSIS — Z86711 Personal history of pulmonary embolism: Secondary | ICD-10-CM | POA: Diagnosis not present

## 2017-07-23 DIAGNOSIS — S81801D Unspecified open wound, right lower leg, subsequent encounter: Secondary | ICD-10-CM | POA: Diagnosis not present

## 2017-07-23 DIAGNOSIS — Z794 Long term (current) use of insulin: Secondary | ICD-10-CM | POA: Diagnosis not present

## 2017-07-23 DIAGNOSIS — Z89511 Acquired absence of right leg below knee: Secondary | ICD-10-CM | POA: Diagnosis not present

## 2017-07-23 DIAGNOSIS — E1122 Type 2 diabetes mellitus with diabetic chronic kidney disease: Secondary | ICD-10-CM | POA: Diagnosis not present

## 2017-07-26 DIAGNOSIS — Z86711 Personal history of pulmonary embolism: Secondary | ICD-10-CM | POA: Diagnosis not present

## 2017-07-26 DIAGNOSIS — I5022 Chronic systolic (congestive) heart failure: Secondary | ICD-10-CM | POA: Diagnosis not present

## 2017-07-26 DIAGNOSIS — Z794 Long term (current) use of insulin: Secondary | ICD-10-CM | POA: Diagnosis not present

## 2017-07-26 DIAGNOSIS — I25810 Atherosclerosis of coronary artery bypass graft(s) without angina pectoris: Secondary | ICD-10-CM | POA: Diagnosis not present

## 2017-07-26 DIAGNOSIS — Z9981 Dependence on supplemental oxygen: Secondary | ICD-10-CM | POA: Diagnosis not present

## 2017-07-26 DIAGNOSIS — D510 Vitamin B12 deficiency anemia due to intrinsic factor deficiency: Secondary | ICD-10-CM | POA: Diagnosis not present

## 2017-07-26 DIAGNOSIS — Z7901 Long term (current) use of anticoagulants: Secondary | ICD-10-CM | POA: Diagnosis not present

## 2017-07-26 DIAGNOSIS — Z87891 Personal history of nicotine dependence: Secondary | ICD-10-CM | POA: Diagnosis not present

## 2017-07-26 DIAGNOSIS — N183 Chronic kidney disease, stage 3 (moderate): Secondary | ICD-10-CM | POA: Diagnosis not present

## 2017-07-26 DIAGNOSIS — E1122 Type 2 diabetes mellitus with diabetic chronic kidney disease: Secondary | ICD-10-CM | POA: Diagnosis not present

## 2017-07-26 DIAGNOSIS — E1151 Type 2 diabetes mellitus with diabetic peripheral angiopathy without gangrene: Secondary | ICD-10-CM | POA: Diagnosis not present

## 2017-07-26 DIAGNOSIS — N179 Acute kidney failure, unspecified: Secondary | ICD-10-CM | POA: Diagnosis not present

## 2017-07-26 DIAGNOSIS — Z89511 Acquired absence of right leg below knee: Secondary | ICD-10-CM | POA: Diagnosis not present

## 2017-07-26 DIAGNOSIS — E1165 Type 2 diabetes mellitus with hyperglycemia: Secondary | ICD-10-CM | POA: Diagnosis not present

## 2017-07-26 DIAGNOSIS — S81801D Unspecified open wound, right lower leg, subsequent encounter: Secondary | ICD-10-CM | POA: Diagnosis not present

## 2017-07-26 DIAGNOSIS — E1142 Type 2 diabetes mellitus with diabetic polyneuropathy: Secondary | ICD-10-CM | POA: Diagnosis not present

## 2017-07-26 DIAGNOSIS — I130 Hypertensive heart and chronic kidney disease with heart failure and stage 1 through stage 4 chronic kidney disease, or unspecified chronic kidney disease: Secondary | ICD-10-CM | POA: Diagnosis not present

## 2017-07-26 DIAGNOSIS — S81001D Unspecified open wound, right knee, subsequent encounter: Secondary | ICD-10-CM | POA: Diagnosis not present

## 2017-07-29 DIAGNOSIS — I130 Hypertensive heart and chronic kidney disease with heart failure and stage 1 through stage 4 chronic kidney disease, or unspecified chronic kidney disease: Secondary | ICD-10-CM | POA: Diagnosis not present

## 2017-07-29 DIAGNOSIS — S81001D Unspecified open wound, right knee, subsequent encounter: Secondary | ICD-10-CM | POA: Diagnosis not present

## 2017-07-29 DIAGNOSIS — E1151 Type 2 diabetes mellitus with diabetic peripheral angiopathy without gangrene: Secondary | ICD-10-CM | POA: Diagnosis not present

## 2017-07-29 DIAGNOSIS — E1165 Type 2 diabetes mellitus with hyperglycemia: Secondary | ICD-10-CM | POA: Diagnosis not present

## 2017-07-29 DIAGNOSIS — E1122 Type 2 diabetes mellitus with diabetic chronic kidney disease: Secondary | ICD-10-CM | POA: Diagnosis not present

## 2017-07-29 DIAGNOSIS — S81801D Unspecified open wound, right lower leg, subsequent encounter: Secondary | ICD-10-CM | POA: Diagnosis not present

## 2017-07-29 DIAGNOSIS — E1142 Type 2 diabetes mellitus with diabetic polyneuropathy: Secondary | ICD-10-CM | POA: Diagnosis not present

## 2017-07-29 DIAGNOSIS — Z86711 Personal history of pulmonary embolism: Secondary | ICD-10-CM | POA: Diagnosis not present

## 2017-07-29 DIAGNOSIS — Z89511 Acquired absence of right leg below knee: Secondary | ICD-10-CM | POA: Diagnosis not present

## 2017-07-29 DIAGNOSIS — I5022 Chronic systolic (congestive) heart failure: Secondary | ICD-10-CM | POA: Diagnosis not present

## 2017-07-29 DIAGNOSIS — N179 Acute kidney failure, unspecified: Secondary | ICD-10-CM | POA: Diagnosis not present

## 2017-07-29 DIAGNOSIS — I25810 Atherosclerosis of coronary artery bypass graft(s) without angina pectoris: Secondary | ICD-10-CM | POA: Diagnosis not present

## 2017-07-29 DIAGNOSIS — Z794 Long term (current) use of insulin: Secondary | ICD-10-CM | POA: Diagnosis not present

## 2017-07-29 DIAGNOSIS — D510 Vitamin B12 deficiency anemia due to intrinsic factor deficiency: Secondary | ICD-10-CM | POA: Diagnosis not present

## 2017-07-29 DIAGNOSIS — Z9981 Dependence on supplemental oxygen: Secondary | ICD-10-CM | POA: Diagnosis not present

## 2017-07-29 DIAGNOSIS — Z7901 Long term (current) use of anticoagulants: Secondary | ICD-10-CM | POA: Diagnosis not present

## 2017-07-29 DIAGNOSIS — N183 Chronic kidney disease, stage 3 (moderate): Secondary | ICD-10-CM | POA: Diagnosis not present

## 2017-07-29 DIAGNOSIS — Z87891 Personal history of nicotine dependence: Secondary | ICD-10-CM | POA: Diagnosis not present

## 2017-08-01 DIAGNOSIS — I25810 Atherosclerosis of coronary artery bypass graft(s) without angina pectoris: Secondary | ICD-10-CM | POA: Diagnosis not present

## 2017-08-01 DIAGNOSIS — E1151 Type 2 diabetes mellitus with diabetic peripheral angiopathy without gangrene: Secondary | ICD-10-CM | POA: Diagnosis not present

## 2017-08-01 DIAGNOSIS — I5022 Chronic systolic (congestive) heart failure: Secondary | ICD-10-CM | POA: Diagnosis not present

## 2017-08-01 DIAGNOSIS — Z9981 Dependence on supplemental oxygen: Secondary | ICD-10-CM | POA: Diagnosis not present

## 2017-08-01 DIAGNOSIS — I130 Hypertensive heart and chronic kidney disease with heart failure and stage 1 through stage 4 chronic kidney disease, or unspecified chronic kidney disease: Secondary | ICD-10-CM | POA: Diagnosis not present

## 2017-08-01 DIAGNOSIS — Z794 Long term (current) use of insulin: Secondary | ICD-10-CM | POA: Diagnosis not present

## 2017-08-01 DIAGNOSIS — E1142 Type 2 diabetes mellitus with diabetic polyneuropathy: Secondary | ICD-10-CM | POA: Diagnosis not present

## 2017-08-01 DIAGNOSIS — D510 Vitamin B12 deficiency anemia due to intrinsic factor deficiency: Secondary | ICD-10-CM | POA: Diagnosis not present

## 2017-08-01 DIAGNOSIS — S81801D Unspecified open wound, right lower leg, subsequent encounter: Secondary | ICD-10-CM | POA: Diagnosis not present

## 2017-08-01 DIAGNOSIS — S81001D Unspecified open wound, right knee, subsequent encounter: Secondary | ICD-10-CM | POA: Diagnosis not present

## 2017-08-01 DIAGNOSIS — E1122 Type 2 diabetes mellitus with diabetic chronic kidney disease: Secondary | ICD-10-CM | POA: Diagnosis not present

## 2017-08-01 DIAGNOSIS — E1165 Type 2 diabetes mellitus with hyperglycemia: Secondary | ICD-10-CM | POA: Diagnosis not present

## 2017-08-01 DIAGNOSIS — Z7901 Long term (current) use of anticoagulants: Secondary | ICD-10-CM | POA: Diagnosis not present

## 2017-08-01 DIAGNOSIS — N183 Chronic kidney disease, stage 3 (moderate): Secondary | ICD-10-CM | POA: Diagnosis not present

## 2017-08-01 DIAGNOSIS — Z87891 Personal history of nicotine dependence: Secondary | ICD-10-CM | POA: Diagnosis not present

## 2017-08-01 DIAGNOSIS — N179 Acute kidney failure, unspecified: Secondary | ICD-10-CM | POA: Diagnosis not present

## 2017-08-01 DIAGNOSIS — Z86711 Personal history of pulmonary embolism: Secondary | ICD-10-CM | POA: Diagnosis not present

## 2017-08-01 DIAGNOSIS — Z89511 Acquired absence of right leg below knee: Secondary | ICD-10-CM | POA: Diagnosis not present

## 2017-08-02 DIAGNOSIS — I2782 Chronic pulmonary embolism: Secondary | ICD-10-CM | POA: Diagnosis not present

## 2017-08-04 DIAGNOSIS — E1122 Type 2 diabetes mellitus with diabetic chronic kidney disease: Secondary | ICD-10-CM | POA: Diagnosis not present

## 2017-08-04 DIAGNOSIS — I5022 Chronic systolic (congestive) heart failure: Secondary | ICD-10-CM | POA: Diagnosis not present

## 2017-08-04 DIAGNOSIS — Z9981 Dependence on supplemental oxygen: Secondary | ICD-10-CM | POA: Diagnosis not present

## 2017-08-04 DIAGNOSIS — Z86711 Personal history of pulmonary embolism: Secondary | ICD-10-CM | POA: Diagnosis not present

## 2017-08-04 DIAGNOSIS — E1151 Type 2 diabetes mellitus with diabetic peripheral angiopathy without gangrene: Secondary | ICD-10-CM | POA: Diagnosis not present

## 2017-08-04 DIAGNOSIS — D510 Vitamin B12 deficiency anemia due to intrinsic factor deficiency: Secondary | ICD-10-CM | POA: Diagnosis not present

## 2017-08-04 DIAGNOSIS — E1142 Type 2 diabetes mellitus with diabetic polyneuropathy: Secondary | ICD-10-CM | POA: Diagnosis not present

## 2017-08-04 DIAGNOSIS — N179 Acute kidney failure, unspecified: Secondary | ICD-10-CM | POA: Diagnosis not present

## 2017-08-04 DIAGNOSIS — E1165 Type 2 diabetes mellitus with hyperglycemia: Secondary | ICD-10-CM | POA: Diagnosis not present

## 2017-08-04 DIAGNOSIS — Z7901 Long term (current) use of anticoagulants: Secondary | ICD-10-CM | POA: Diagnosis not present

## 2017-08-04 DIAGNOSIS — I130 Hypertensive heart and chronic kidney disease with heart failure and stage 1 through stage 4 chronic kidney disease, or unspecified chronic kidney disease: Secondary | ICD-10-CM | POA: Diagnosis not present

## 2017-08-04 DIAGNOSIS — N183 Chronic kidney disease, stage 3 (moderate): Secondary | ICD-10-CM | POA: Diagnosis not present

## 2017-08-04 DIAGNOSIS — S81801D Unspecified open wound, right lower leg, subsequent encounter: Secondary | ICD-10-CM | POA: Diagnosis not present

## 2017-08-04 DIAGNOSIS — Z794 Long term (current) use of insulin: Secondary | ICD-10-CM | POA: Diagnosis not present

## 2017-08-04 DIAGNOSIS — S81001D Unspecified open wound, right knee, subsequent encounter: Secondary | ICD-10-CM | POA: Diagnosis not present

## 2017-08-04 DIAGNOSIS — Z89511 Acquired absence of right leg below knee: Secondary | ICD-10-CM | POA: Diagnosis not present

## 2017-08-04 DIAGNOSIS — I25810 Atherosclerosis of coronary artery bypass graft(s) without angina pectoris: Secondary | ICD-10-CM | POA: Diagnosis not present

## 2017-08-04 DIAGNOSIS — Z87891 Personal history of nicotine dependence: Secondary | ICD-10-CM | POA: Diagnosis not present

## 2017-08-08 DIAGNOSIS — I5022 Chronic systolic (congestive) heart failure: Secondary | ICD-10-CM | POA: Diagnosis not present

## 2017-08-08 DIAGNOSIS — Z86711 Personal history of pulmonary embolism: Secondary | ICD-10-CM | POA: Diagnosis not present

## 2017-08-08 DIAGNOSIS — Z7901 Long term (current) use of anticoagulants: Secondary | ICD-10-CM | POA: Diagnosis not present

## 2017-08-08 DIAGNOSIS — E1142 Type 2 diabetes mellitus with diabetic polyneuropathy: Secondary | ICD-10-CM | POA: Diagnosis not present

## 2017-08-08 DIAGNOSIS — I130 Hypertensive heart and chronic kidney disease with heart failure and stage 1 through stage 4 chronic kidney disease, or unspecified chronic kidney disease: Secondary | ICD-10-CM | POA: Diagnosis not present

## 2017-08-08 DIAGNOSIS — I25810 Atherosclerosis of coronary artery bypass graft(s) without angina pectoris: Secondary | ICD-10-CM | POA: Diagnosis not present

## 2017-08-08 DIAGNOSIS — Z89511 Acquired absence of right leg below knee: Secondary | ICD-10-CM | POA: Diagnosis not present

## 2017-08-08 DIAGNOSIS — E1165 Type 2 diabetes mellitus with hyperglycemia: Secondary | ICD-10-CM | POA: Diagnosis not present

## 2017-08-08 DIAGNOSIS — Z9981 Dependence on supplemental oxygen: Secondary | ICD-10-CM | POA: Diagnosis not present

## 2017-08-08 DIAGNOSIS — E1122 Type 2 diabetes mellitus with diabetic chronic kidney disease: Secondary | ICD-10-CM | POA: Diagnosis not present

## 2017-08-08 DIAGNOSIS — E1151 Type 2 diabetes mellitus with diabetic peripheral angiopathy without gangrene: Secondary | ICD-10-CM | POA: Diagnosis not present

## 2017-08-08 DIAGNOSIS — D510 Vitamin B12 deficiency anemia due to intrinsic factor deficiency: Secondary | ICD-10-CM | POA: Diagnosis not present

## 2017-08-08 DIAGNOSIS — N179 Acute kidney failure, unspecified: Secondary | ICD-10-CM | POA: Diagnosis not present

## 2017-08-08 DIAGNOSIS — Z794 Long term (current) use of insulin: Secondary | ICD-10-CM | POA: Diagnosis not present

## 2017-08-08 DIAGNOSIS — Z87891 Personal history of nicotine dependence: Secondary | ICD-10-CM | POA: Diagnosis not present

## 2017-08-08 DIAGNOSIS — S81001D Unspecified open wound, right knee, subsequent encounter: Secondary | ICD-10-CM | POA: Diagnosis not present

## 2017-08-08 DIAGNOSIS — S81801D Unspecified open wound, right lower leg, subsequent encounter: Secondary | ICD-10-CM | POA: Diagnosis not present

## 2017-08-08 DIAGNOSIS — N183 Chronic kidney disease, stage 3 (moderate): Secondary | ICD-10-CM | POA: Diagnosis not present

## 2017-08-13 DIAGNOSIS — Z86711 Personal history of pulmonary embolism: Secondary | ICD-10-CM | POA: Diagnosis not present

## 2017-08-13 DIAGNOSIS — N179 Acute kidney failure, unspecified: Secondary | ICD-10-CM | POA: Diagnosis not present

## 2017-08-13 DIAGNOSIS — Z7901 Long term (current) use of anticoagulants: Secondary | ICD-10-CM | POA: Diagnosis not present

## 2017-08-13 DIAGNOSIS — D510 Vitamin B12 deficiency anemia due to intrinsic factor deficiency: Secondary | ICD-10-CM | POA: Diagnosis not present

## 2017-08-13 DIAGNOSIS — Z9981 Dependence on supplemental oxygen: Secondary | ICD-10-CM | POA: Diagnosis not present

## 2017-08-13 DIAGNOSIS — N183 Chronic kidney disease, stage 3 (moderate): Secondary | ICD-10-CM | POA: Diagnosis not present

## 2017-08-13 DIAGNOSIS — I5022 Chronic systolic (congestive) heart failure: Secondary | ICD-10-CM | POA: Diagnosis not present

## 2017-08-13 DIAGNOSIS — Z87891 Personal history of nicotine dependence: Secondary | ICD-10-CM | POA: Diagnosis not present

## 2017-08-13 DIAGNOSIS — S81001D Unspecified open wound, right knee, subsequent encounter: Secondary | ICD-10-CM | POA: Diagnosis not present

## 2017-08-13 DIAGNOSIS — Z89511 Acquired absence of right leg below knee: Secondary | ICD-10-CM | POA: Diagnosis not present

## 2017-08-13 DIAGNOSIS — I130 Hypertensive heart and chronic kidney disease with heart failure and stage 1 through stage 4 chronic kidney disease, or unspecified chronic kidney disease: Secondary | ICD-10-CM | POA: Diagnosis not present

## 2017-08-13 DIAGNOSIS — E1122 Type 2 diabetes mellitus with diabetic chronic kidney disease: Secondary | ICD-10-CM | POA: Diagnosis not present

## 2017-08-13 DIAGNOSIS — Z794 Long term (current) use of insulin: Secondary | ICD-10-CM | POA: Diagnosis not present

## 2017-08-13 DIAGNOSIS — E1165 Type 2 diabetes mellitus with hyperglycemia: Secondary | ICD-10-CM | POA: Diagnosis not present

## 2017-08-13 DIAGNOSIS — E1151 Type 2 diabetes mellitus with diabetic peripheral angiopathy without gangrene: Secondary | ICD-10-CM | POA: Diagnosis not present

## 2017-08-13 DIAGNOSIS — S81801D Unspecified open wound, right lower leg, subsequent encounter: Secondary | ICD-10-CM | POA: Diagnosis not present

## 2017-08-13 DIAGNOSIS — E1142 Type 2 diabetes mellitus with diabetic polyneuropathy: Secondary | ICD-10-CM | POA: Diagnosis not present

## 2017-08-13 DIAGNOSIS — I25810 Atherosclerosis of coronary artery bypass graft(s) without angina pectoris: Secondary | ICD-10-CM | POA: Diagnosis not present

## 2017-08-15 DIAGNOSIS — L0390 Cellulitis, unspecified: Secondary | ICD-10-CM | POA: Diagnosis not present

## 2017-08-15 DIAGNOSIS — I959 Hypotension, unspecified: Secondary | ICD-10-CM | POA: Diagnosis not present

## 2017-08-15 DIAGNOSIS — Z79899 Other long term (current) drug therapy: Secondary | ICD-10-CM | POA: Diagnosis not present

## 2017-08-15 DIAGNOSIS — Z7901 Long term (current) use of anticoagulants: Secondary | ICD-10-CM | POA: Diagnosis not present

## 2017-08-15 DIAGNOSIS — E1140 Type 2 diabetes mellitus with diabetic neuropathy, unspecified: Secondary | ICD-10-CM | POA: Diagnosis not present

## 2017-08-15 DIAGNOSIS — Z7982 Long term (current) use of aspirin: Secondary | ICD-10-CM | POA: Diagnosis not present

## 2017-08-15 DIAGNOSIS — I25118 Atherosclerotic heart disease of native coronary artery with other forms of angina pectoris: Secondary | ICD-10-CM | POA: Diagnosis not present

## 2017-08-15 DIAGNOSIS — E1151 Type 2 diabetes mellitus with diabetic peripheral angiopathy without gangrene: Secondary | ICD-10-CM | POA: Diagnosis not present

## 2017-08-15 DIAGNOSIS — Z794 Long term (current) use of insulin: Secondary | ICD-10-CM | POA: Diagnosis not present

## 2017-08-15 DIAGNOSIS — E782 Mixed hyperlipidemia: Secondary | ICD-10-CM | POA: Diagnosis not present

## 2017-08-15 DIAGNOSIS — N400 Enlarged prostate without lower urinary tract symptoms: Secondary | ICD-10-CM | POA: Diagnosis not present

## 2017-08-15 DIAGNOSIS — E11621 Type 2 diabetes mellitus with foot ulcer: Secondary | ICD-10-CM | POA: Diagnosis not present

## 2017-08-15 DIAGNOSIS — I5022 Chronic systolic (congestive) heart failure: Secondary | ICD-10-CM | POA: Diagnosis not present

## 2017-08-15 DIAGNOSIS — M19042 Primary osteoarthritis, left hand: Secondary | ICD-10-CM | POA: Diagnosis not present

## 2017-08-15 DIAGNOSIS — E1165 Type 2 diabetes mellitus with hyperglycemia: Secondary | ICD-10-CM | POA: Diagnosis not present

## 2017-08-15 DIAGNOSIS — E785 Hyperlipidemia, unspecified: Secondary | ICD-10-CM | POA: Diagnosis not present

## 2017-08-15 DIAGNOSIS — I130 Hypertensive heart and chronic kidney disease with heart failure and stage 1 through stage 4 chronic kidney disease, or unspecified chronic kidney disease: Secondary | ICD-10-CM | POA: Diagnosis not present

## 2017-08-15 DIAGNOSIS — I10 Essential (primary) hypertension: Secondary | ICD-10-CM | POA: Diagnosis not present

## 2017-08-15 DIAGNOSIS — N179 Acute kidney failure, unspecified: Secondary | ICD-10-CM | POA: Diagnosis not present

## 2017-08-15 DIAGNOSIS — M19041 Primary osteoarthritis, right hand: Secondary | ICD-10-CM | POA: Diagnosis not present

## 2017-08-15 DIAGNOSIS — N183 Chronic kidney disease, stage 3 (moderate): Secondary | ICD-10-CM | POA: Diagnosis not present

## 2017-08-15 DIAGNOSIS — I2510 Atherosclerotic heart disease of native coronary artery without angina pectoris: Secondary | ICD-10-CM | POA: Diagnosis not present

## 2017-08-15 DIAGNOSIS — S81801A Unspecified open wound, right lower leg, initial encounter: Secondary | ICD-10-CM | POA: Diagnosis not present

## 2017-08-15 DIAGNOSIS — G8929 Other chronic pain: Secondary | ICD-10-CM | POA: Diagnosis not present

## 2017-08-15 DIAGNOSIS — E1122 Type 2 diabetes mellitus with diabetic chronic kidney disease: Secondary | ICD-10-CM | POA: Diagnosis not present

## 2017-08-15 DIAGNOSIS — M170 Bilateral primary osteoarthritis of knee: Secondary | ICD-10-CM | POA: Diagnosis not present

## 2017-08-15 DIAGNOSIS — Z89511 Acquired absence of right leg below knee: Secondary | ICD-10-CM | POA: Diagnosis not present

## 2017-08-15 DIAGNOSIS — I739 Peripheral vascular disease, unspecified: Secondary | ICD-10-CM | POA: Diagnosis not present

## 2017-08-15 DIAGNOSIS — Z86711 Personal history of pulmonary embolism: Secondary | ICD-10-CM | POA: Diagnosis not present

## 2017-08-15 DIAGNOSIS — R739 Hyperglycemia, unspecified: Secondary | ICD-10-CM | POA: Diagnosis not present

## 2017-08-15 DIAGNOSIS — L03115 Cellulitis of right lower limb: Secondary | ICD-10-CM | POA: Diagnosis not present

## 2017-08-16 DIAGNOSIS — S81801A Unspecified open wound, right lower leg, initial encounter: Secondary | ICD-10-CM | POA: Diagnosis not present

## 2017-08-20 DIAGNOSIS — E1165 Type 2 diabetes mellitus with hyperglycemia: Secondary | ICD-10-CM | POA: Diagnosis not present

## 2017-08-20 DIAGNOSIS — I25810 Atherosclerosis of coronary artery bypass graft(s) without angina pectoris: Secondary | ICD-10-CM | POA: Diagnosis not present

## 2017-08-20 DIAGNOSIS — I130 Hypertensive heart and chronic kidney disease with heart failure and stage 1 through stage 4 chronic kidney disease, or unspecified chronic kidney disease: Secondary | ICD-10-CM | POA: Diagnosis not present

## 2017-08-20 DIAGNOSIS — Z7901 Long term (current) use of anticoagulants: Secondary | ICD-10-CM | POA: Diagnosis not present

## 2017-08-20 DIAGNOSIS — E1151 Type 2 diabetes mellitus with diabetic peripheral angiopathy without gangrene: Secondary | ICD-10-CM | POA: Diagnosis not present

## 2017-08-20 DIAGNOSIS — Z87891 Personal history of nicotine dependence: Secondary | ICD-10-CM | POA: Diagnosis not present

## 2017-08-20 DIAGNOSIS — E1122 Type 2 diabetes mellitus with diabetic chronic kidney disease: Secondary | ICD-10-CM | POA: Diagnosis not present

## 2017-08-20 DIAGNOSIS — Z794 Long term (current) use of insulin: Secondary | ICD-10-CM | POA: Diagnosis not present

## 2017-08-20 DIAGNOSIS — E1142 Type 2 diabetes mellitus with diabetic polyneuropathy: Secondary | ICD-10-CM | POA: Diagnosis not present

## 2017-08-20 DIAGNOSIS — D510 Vitamin B12 deficiency anemia due to intrinsic factor deficiency: Secondary | ICD-10-CM | POA: Diagnosis not present

## 2017-08-20 DIAGNOSIS — N183 Chronic kidney disease, stage 3 (moderate): Secondary | ICD-10-CM | POA: Diagnosis not present

## 2017-08-20 DIAGNOSIS — N179 Acute kidney failure, unspecified: Secondary | ICD-10-CM | POA: Diagnosis not present

## 2017-08-20 DIAGNOSIS — Z9981 Dependence on supplemental oxygen: Secondary | ICD-10-CM | POA: Diagnosis not present

## 2017-08-20 DIAGNOSIS — S81001D Unspecified open wound, right knee, subsequent encounter: Secondary | ICD-10-CM | POA: Diagnosis not present

## 2017-08-20 DIAGNOSIS — S81801D Unspecified open wound, right lower leg, subsequent encounter: Secondary | ICD-10-CM | POA: Diagnosis not present

## 2017-08-20 DIAGNOSIS — I5022 Chronic systolic (congestive) heart failure: Secondary | ICD-10-CM | POA: Diagnosis not present

## 2017-08-20 DIAGNOSIS — Z89511 Acquired absence of right leg below knee: Secondary | ICD-10-CM | POA: Diagnosis not present

## 2017-08-20 DIAGNOSIS — Z86711 Personal history of pulmonary embolism: Secondary | ICD-10-CM | POA: Diagnosis not present

## 2017-08-26 DIAGNOSIS — N179 Acute kidney failure, unspecified: Secondary | ICD-10-CM | POA: Diagnosis not present

## 2017-08-26 DIAGNOSIS — L89891 Pressure ulcer of other site, stage 1: Secondary | ICD-10-CM | POA: Diagnosis not present

## 2017-08-26 DIAGNOSIS — N189 Chronic kidney disease, unspecified: Secondary | ICD-10-CM | POA: Diagnosis not present

## 2017-08-26 DIAGNOSIS — Z9189 Other specified personal risk factors, not elsewhere classified: Secondary | ICD-10-CM | POA: Diagnosis not present

## 2017-08-26 DIAGNOSIS — E11622 Type 2 diabetes mellitus with other skin ulcer: Secondary | ICD-10-CM | POA: Diagnosis not present

## 2017-08-27 DIAGNOSIS — I5022 Chronic systolic (congestive) heart failure: Secondary | ICD-10-CM | POA: Diagnosis not present

## 2017-08-27 DIAGNOSIS — N183 Chronic kidney disease, stage 3 (moderate): Secondary | ICD-10-CM | POA: Diagnosis not present

## 2017-08-27 DIAGNOSIS — Z89511 Acquired absence of right leg below knee: Secondary | ICD-10-CM | POA: Diagnosis not present

## 2017-08-27 DIAGNOSIS — I130 Hypertensive heart and chronic kidney disease with heart failure and stage 1 through stage 4 chronic kidney disease, or unspecified chronic kidney disease: Secondary | ICD-10-CM | POA: Diagnosis not present

## 2017-08-27 DIAGNOSIS — E1122 Type 2 diabetes mellitus with diabetic chronic kidney disease: Secondary | ICD-10-CM | POA: Diagnosis not present

## 2017-08-27 DIAGNOSIS — I25810 Atherosclerosis of coronary artery bypass graft(s) without angina pectoris: Secondary | ICD-10-CM | POA: Diagnosis not present

## 2017-08-27 DIAGNOSIS — Z87891 Personal history of nicotine dependence: Secondary | ICD-10-CM | POA: Diagnosis not present

## 2017-08-27 DIAGNOSIS — D510 Vitamin B12 deficiency anemia due to intrinsic factor deficiency: Secondary | ICD-10-CM | POA: Diagnosis not present

## 2017-08-27 DIAGNOSIS — Z7901 Long term (current) use of anticoagulants: Secondary | ICD-10-CM | POA: Diagnosis not present

## 2017-08-27 DIAGNOSIS — E1142 Type 2 diabetes mellitus with diabetic polyneuropathy: Secondary | ICD-10-CM | POA: Diagnosis not present

## 2017-08-27 DIAGNOSIS — S81001D Unspecified open wound, right knee, subsequent encounter: Secondary | ICD-10-CM | POA: Diagnosis not present

## 2017-08-27 DIAGNOSIS — Z86711 Personal history of pulmonary embolism: Secondary | ICD-10-CM | POA: Diagnosis not present

## 2017-08-27 DIAGNOSIS — Z9981 Dependence on supplemental oxygen: Secondary | ICD-10-CM | POA: Diagnosis not present

## 2017-08-27 DIAGNOSIS — E1165 Type 2 diabetes mellitus with hyperglycemia: Secondary | ICD-10-CM | POA: Diagnosis not present

## 2017-08-27 DIAGNOSIS — S81801D Unspecified open wound, right lower leg, subsequent encounter: Secondary | ICD-10-CM | POA: Diagnosis not present

## 2017-08-27 DIAGNOSIS — E1151 Type 2 diabetes mellitus with diabetic peripheral angiopathy without gangrene: Secondary | ICD-10-CM | POA: Diagnosis not present

## 2017-08-27 DIAGNOSIS — Z794 Long term (current) use of insulin: Secondary | ICD-10-CM | POA: Diagnosis not present

## 2017-08-27 DIAGNOSIS — N179 Acute kidney failure, unspecified: Secondary | ICD-10-CM | POA: Diagnosis not present

## 2017-08-29 DIAGNOSIS — S81001D Unspecified open wound, right knee, subsequent encounter: Secondary | ICD-10-CM | POA: Diagnosis not present

## 2017-09-02 DIAGNOSIS — I2782 Chronic pulmonary embolism: Secondary | ICD-10-CM | POA: Diagnosis not present

## 2017-09-03 DIAGNOSIS — S81001D Unspecified open wound, right knee, subsequent encounter: Secondary | ICD-10-CM | POA: Diagnosis not present

## 2017-09-05 DIAGNOSIS — E1165 Type 2 diabetes mellitus with hyperglycemia: Secondary | ICD-10-CM | POA: Diagnosis not present

## 2017-09-05 DIAGNOSIS — Z89511 Acquired absence of right leg below knee: Secondary | ICD-10-CM | POA: Diagnosis not present

## 2017-09-05 DIAGNOSIS — Z7901 Long term (current) use of anticoagulants: Secondary | ICD-10-CM | POA: Diagnosis not present

## 2017-09-05 DIAGNOSIS — Z9981 Dependence on supplemental oxygen: Secondary | ICD-10-CM | POA: Diagnosis not present

## 2017-09-05 DIAGNOSIS — Z87891 Personal history of nicotine dependence: Secondary | ICD-10-CM | POA: Diagnosis not present

## 2017-09-05 DIAGNOSIS — S81801D Unspecified open wound, right lower leg, subsequent encounter: Secondary | ICD-10-CM | POA: Diagnosis not present

## 2017-09-05 DIAGNOSIS — I130 Hypertensive heart and chronic kidney disease with heart failure and stage 1 through stage 4 chronic kidney disease, or unspecified chronic kidney disease: Secondary | ICD-10-CM | POA: Diagnosis not present

## 2017-09-05 DIAGNOSIS — Z794 Long term (current) use of insulin: Secondary | ICD-10-CM | POA: Diagnosis not present

## 2017-09-05 DIAGNOSIS — D510 Vitamin B12 deficiency anemia due to intrinsic factor deficiency: Secondary | ICD-10-CM | POA: Diagnosis not present

## 2017-09-05 DIAGNOSIS — E1142 Type 2 diabetes mellitus with diabetic polyneuropathy: Secondary | ICD-10-CM | POA: Diagnosis not present

## 2017-09-05 DIAGNOSIS — I5022 Chronic systolic (congestive) heart failure: Secondary | ICD-10-CM | POA: Diagnosis not present

## 2017-09-05 DIAGNOSIS — S81001D Unspecified open wound, right knee, subsequent encounter: Secondary | ICD-10-CM | POA: Diagnosis not present

## 2017-09-05 DIAGNOSIS — Z86711 Personal history of pulmonary embolism: Secondary | ICD-10-CM | POA: Diagnosis not present

## 2017-09-05 DIAGNOSIS — E1122 Type 2 diabetes mellitus with diabetic chronic kidney disease: Secondary | ICD-10-CM | POA: Diagnosis not present

## 2017-09-05 DIAGNOSIS — N183 Chronic kidney disease, stage 3 (moderate): Secondary | ICD-10-CM | POA: Diagnosis not present

## 2017-09-05 DIAGNOSIS — E1151 Type 2 diabetes mellitus with diabetic peripheral angiopathy without gangrene: Secondary | ICD-10-CM | POA: Diagnosis not present

## 2017-09-05 DIAGNOSIS — I25810 Atherosclerosis of coronary artery bypass graft(s) without angina pectoris: Secondary | ICD-10-CM | POA: Diagnosis not present

## 2017-09-05 DIAGNOSIS — N179 Acute kidney failure, unspecified: Secondary | ICD-10-CM | POA: Diagnosis not present

## 2017-09-09 DIAGNOSIS — E11622 Type 2 diabetes mellitus with other skin ulcer: Secondary | ICD-10-CM | POA: Diagnosis not present

## 2017-09-09 DIAGNOSIS — L97909 Non-pressure chronic ulcer of unspecified part of unspecified lower leg with unspecified severity: Secondary | ICD-10-CM | POA: Diagnosis not present

## 2017-09-10 DIAGNOSIS — D510 Vitamin B12 deficiency anemia due to intrinsic factor deficiency: Secondary | ICD-10-CM | POA: Diagnosis not present

## 2017-09-10 DIAGNOSIS — I130 Hypertensive heart and chronic kidney disease with heart failure and stage 1 through stage 4 chronic kidney disease, or unspecified chronic kidney disease: Secondary | ICD-10-CM | POA: Diagnosis not present

## 2017-09-10 DIAGNOSIS — N183 Chronic kidney disease, stage 3 (moderate): Secondary | ICD-10-CM | POA: Diagnosis not present

## 2017-09-10 DIAGNOSIS — I5022 Chronic systolic (congestive) heart failure: Secondary | ICD-10-CM | POA: Diagnosis not present

## 2017-09-10 DIAGNOSIS — S81001D Unspecified open wound, right knee, subsequent encounter: Secondary | ICD-10-CM | POA: Diagnosis not present

## 2017-09-10 DIAGNOSIS — Z86711 Personal history of pulmonary embolism: Secondary | ICD-10-CM | POA: Diagnosis not present

## 2017-09-10 DIAGNOSIS — E1122 Type 2 diabetes mellitus with diabetic chronic kidney disease: Secondary | ICD-10-CM | POA: Diagnosis not present

## 2017-09-10 DIAGNOSIS — Z9981 Dependence on supplemental oxygen: Secondary | ICD-10-CM | POA: Diagnosis not present

## 2017-09-10 DIAGNOSIS — Z89511 Acquired absence of right leg below knee: Secondary | ICD-10-CM | POA: Diagnosis not present

## 2017-09-10 DIAGNOSIS — E1151 Type 2 diabetes mellitus with diabetic peripheral angiopathy without gangrene: Secondary | ICD-10-CM | POA: Diagnosis not present

## 2017-09-10 DIAGNOSIS — Z87891 Personal history of nicotine dependence: Secondary | ICD-10-CM | POA: Diagnosis not present

## 2017-09-10 DIAGNOSIS — E1165 Type 2 diabetes mellitus with hyperglycemia: Secondary | ICD-10-CM | POA: Diagnosis not present

## 2017-09-10 DIAGNOSIS — I25810 Atherosclerosis of coronary artery bypass graft(s) without angina pectoris: Secondary | ICD-10-CM | POA: Diagnosis not present

## 2017-09-10 DIAGNOSIS — Z7901 Long term (current) use of anticoagulants: Secondary | ICD-10-CM | POA: Diagnosis not present

## 2017-09-10 DIAGNOSIS — E1142 Type 2 diabetes mellitus with diabetic polyneuropathy: Secondary | ICD-10-CM | POA: Diagnosis not present

## 2017-09-10 DIAGNOSIS — Z794 Long term (current) use of insulin: Secondary | ICD-10-CM | POA: Diagnosis not present

## 2017-09-10 DIAGNOSIS — N179 Acute kidney failure, unspecified: Secondary | ICD-10-CM | POA: Diagnosis not present

## 2017-09-17 DIAGNOSIS — Z87891 Personal history of nicotine dependence: Secondary | ICD-10-CM | POA: Diagnosis not present

## 2017-09-17 DIAGNOSIS — E1151 Type 2 diabetes mellitus with diabetic peripheral angiopathy without gangrene: Secondary | ICD-10-CM | POA: Diagnosis not present

## 2017-09-17 DIAGNOSIS — N183 Chronic kidney disease, stage 3 (moderate): Secondary | ICD-10-CM | POA: Diagnosis not present

## 2017-09-17 DIAGNOSIS — D510 Vitamin B12 deficiency anemia due to intrinsic factor deficiency: Secondary | ICD-10-CM | POA: Diagnosis not present

## 2017-09-17 DIAGNOSIS — Z89511 Acquired absence of right leg below knee: Secondary | ICD-10-CM | POA: Diagnosis not present

## 2017-09-17 DIAGNOSIS — E1142 Type 2 diabetes mellitus with diabetic polyneuropathy: Secondary | ICD-10-CM | POA: Diagnosis not present

## 2017-09-17 DIAGNOSIS — I25810 Atherosclerosis of coronary artery bypass graft(s) without angina pectoris: Secondary | ICD-10-CM | POA: Diagnosis not present

## 2017-09-17 DIAGNOSIS — E1122 Type 2 diabetes mellitus with diabetic chronic kidney disease: Secondary | ICD-10-CM | POA: Diagnosis not present

## 2017-09-17 DIAGNOSIS — Z794 Long term (current) use of insulin: Secondary | ICD-10-CM | POA: Diagnosis not present

## 2017-09-17 DIAGNOSIS — N179 Acute kidney failure, unspecified: Secondary | ICD-10-CM | POA: Diagnosis not present

## 2017-09-17 DIAGNOSIS — E1165 Type 2 diabetes mellitus with hyperglycemia: Secondary | ICD-10-CM | POA: Diagnosis not present

## 2017-09-17 DIAGNOSIS — I5022 Chronic systolic (congestive) heart failure: Secondary | ICD-10-CM | POA: Diagnosis not present

## 2017-09-17 DIAGNOSIS — Z9981 Dependence on supplemental oxygen: Secondary | ICD-10-CM | POA: Diagnosis not present

## 2017-09-17 DIAGNOSIS — I130 Hypertensive heart and chronic kidney disease with heart failure and stage 1 through stage 4 chronic kidney disease, or unspecified chronic kidney disease: Secondary | ICD-10-CM | POA: Diagnosis not present

## 2017-09-17 DIAGNOSIS — Z7901 Long term (current) use of anticoagulants: Secondary | ICD-10-CM | POA: Diagnosis not present

## 2017-09-17 DIAGNOSIS — Z86711 Personal history of pulmonary embolism: Secondary | ICD-10-CM | POA: Diagnosis not present

## 2017-09-17 DIAGNOSIS — S81001D Unspecified open wound, right knee, subsequent encounter: Secondary | ICD-10-CM | POA: Diagnosis not present

## 2017-09-24 DIAGNOSIS — L97909 Non-pressure chronic ulcer of unspecified part of unspecified lower leg with unspecified severity: Secondary | ICD-10-CM | POA: Diagnosis not present

## 2017-09-24 DIAGNOSIS — E1165 Type 2 diabetes mellitus with hyperglycemia: Secondary | ICD-10-CM | POA: Diagnosis not present

## 2017-09-24 DIAGNOSIS — E11622 Type 2 diabetes mellitus with other skin ulcer: Secondary | ICD-10-CM | POA: Diagnosis not present

## 2017-09-25 DIAGNOSIS — Z794 Long term (current) use of insulin: Secondary | ICD-10-CM | POA: Diagnosis not present

## 2017-09-25 DIAGNOSIS — Z9981 Dependence on supplemental oxygen: Secondary | ICD-10-CM | POA: Diagnosis not present

## 2017-09-25 DIAGNOSIS — N179 Acute kidney failure, unspecified: Secondary | ICD-10-CM | POA: Diagnosis not present

## 2017-09-25 DIAGNOSIS — E1122 Type 2 diabetes mellitus with diabetic chronic kidney disease: Secondary | ICD-10-CM | POA: Diagnosis not present

## 2017-09-25 DIAGNOSIS — Z87891 Personal history of nicotine dependence: Secondary | ICD-10-CM | POA: Diagnosis not present

## 2017-09-25 DIAGNOSIS — Z86711 Personal history of pulmonary embolism: Secondary | ICD-10-CM | POA: Diagnosis not present

## 2017-09-25 DIAGNOSIS — D510 Vitamin B12 deficiency anemia due to intrinsic factor deficiency: Secondary | ICD-10-CM | POA: Diagnosis not present

## 2017-09-25 DIAGNOSIS — I130 Hypertensive heart and chronic kidney disease with heart failure and stage 1 through stage 4 chronic kidney disease, or unspecified chronic kidney disease: Secondary | ICD-10-CM | POA: Diagnosis not present

## 2017-09-25 DIAGNOSIS — Z7901 Long term (current) use of anticoagulants: Secondary | ICD-10-CM | POA: Diagnosis not present

## 2017-09-25 DIAGNOSIS — S81001D Unspecified open wound, right knee, subsequent encounter: Secondary | ICD-10-CM | POA: Diagnosis not present

## 2017-09-25 DIAGNOSIS — E1151 Type 2 diabetes mellitus with diabetic peripheral angiopathy without gangrene: Secondary | ICD-10-CM | POA: Diagnosis not present

## 2017-09-25 DIAGNOSIS — E1165 Type 2 diabetes mellitus with hyperglycemia: Secondary | ICD-10-CM | POA: Diagnosis not present

## 2017-09-25 DIAGNOSIS — E1142 Type 2 diabetes mellitus with diabetic polyneuropathy: Secondary | ICD-10-CM | POA: Diagnosis not present

## 2017-09-25 DIAGNOSIS — Z89511 Acquired absence of right leg below knee: Secondary | ICD-10-CM | POA: Diagnosis not present

## 2017-09-25 DIAGNOSIS — N183 Chronic kidney disease, stage 3 (moderate): Secondary | ICD-10-CM | POA: Diagnosis not present

## 2017-09-25 DIAGNOSIS — I5022 Chronic systolic (congestive) heart failure: Secondary | ICD-10-CM | POA: Diagnosis not present

## 2017-09-25 DIAGNOSIS — I25810 Atherosclerosis of coronary artery bypass graft(s) without angina pectoris: Secondary | ICD-10-CM | POA: Diagnosis not present

## 2017-10-02 DIAGNOSIS — I130 Hypertensive heart and chronic kidney disease with heart failure and stage 1 through stage 4 chronic kidney disease, or unspecified chronic kidney disease: Secondary | ICD-10-CM | POA: Diagnosis not present

## 2017-10-02 DIAGNOSIS — Z87891 Personal history of nicotine dependence: Secondary | ICD-10-CM | POA: Diagnosis not present

## 2017-10-02 DIAGNOSIS — Z9981 Dependence on supplemental oxygen: Secondary | ICD-10-CM | POA: Diagnosis not present

## 2017-10-02 DIAGNOSIS — S81001D Unspecified open wound, right knee, subsequent encounter: Secondary | ICD-10-CM | POA: Diagnosis not present

## 2017-10-02 DIAGNOSIS — N183 Chronic kidney disease, stage 3 (moderate): Secondary | ICD-10-CM | POA: Diagnosis not present

## 2017-10-02 DIAGNOSIS — E1151 Type 2 diabetes mellitus with diabetic peripheral angiopathy without gangrene: Secondary | ICD-10-CM | POA: Diagnosis not present

## 2017-10-02 DIAGNOSIS — Z86711 Personal history of pulmonary embolism: Secondary | ICD-10-CM | POA: Diagnosis not present

## 2017-10-02 DIAGNOSIS — I25810 Atherosclerosis of coronary artery bypass graft(s) without angina pectoris: Secondary | ICD-10-CM | POA: Diagnosis not present

## 2017-10-02 DIAGNOSIS — I5022 Chronic systolic (congestive) heart failure: Secondary | ICD-10-CM | POA: Diagnosis not present

## 2017-10-02 DIAGNOSIS — E1165 Type 2 diabetes mellitus with hyperglycemia: Secondary | ICD-10-CM | POA: Diagnosis not present

## 2017-10-02 DIAGNOSIS — D510 Vitamin B12 deficiency anemia due to intrinsic factor deficiency: Secondary | ICD-10-CM | POA: Diagnosis not present

## 2017-10-02 DIAGNOSIS — E1122 Type 2 diabetes mellitus with diabetic chronic kidney disease: Secondary | ICD-10-CM | POA: Diagnosis not present

## 2017-10-02 DIAGNOSIS — Z7901 Long term (current) use of anticoagulants: Secondary | ICD-10-CM | POA: Diagnosis not present

## 2017-10-02 DIAGNOSIS — N179 Acute kidney failure, unspecified: Secondary | ICD-10-CM | POA: Diagnosis not present

## 2017-10-02 DIAGNOSIS — Z794 Long term (current) use of insulin: Secondary | ICD-10-CM | POA: Diagnosis not present

## 2017-10-02 DIAGNOSIS — Z89511 Acquired absence of right leg below knee: Secondary | ICD-10-CM | POA: Diagnosis not present

## 2017-10-02 DIAGNOSIS — E1142 Type 2 diabetes mellitus with diabetic polyneuropathy: Secondary | ICD-10-CM | POA: Diagnosis not present

## 2017-10-03 DIAGNOSIS — I2782 Chronic pulmonary embolism: Secondary | ICD-10-CM | POA: Diagnosis not present

## 2017-10-07 DIAGNOSIS — E1142 Type 2 diabetes mellitus with diabetic polyneuropathy: Secondary | ICD-10-CM | POA: Diagnosis not present

## 2017-10-07 DIAGNOSIS — N183 Chronic kidney disease, stage 3 (moderate): Secondary | ICD-10-CM | POA: Diagnosis not present

## 2017-10-07 DIAGNOSIS — D510 Vitamin B12 deficiency anemia due to intrinsic factor deficiency: Secondary | ICD-10-CM | POA: Diagnosis not present

## 2017-10-07 DIAGNOSIS — Z86711 Personal history of pulmonary embolism: Secondary | ICD-10-CM | POA: Diagnosis not present

## 2017-10-07 DIAGNOSIS — Z794 Long term (current) use of insulin: Secondary | ICD-10-CM | POA: Diagnosis not present

## 2017-10-07 DIAGNOSIS — E1122 Type 2 diabetes mellitus with diabetic chronic kidney disease: Secondary | ICD-10-CM | POA: Diagnosis not present

## 2017-10-07 DIAGNOSIS — N179 Acute kidney failure, unspecified: Secondary | ICD-10-CM | POA: Diagnosis not present

## 2017-10-07 DIAGNOSIS — Z87891 Personal history of nicotine dependence: Secondary | ICD-10-CM | POA: Diagnosis not present

## 2017-10-07 DIAGNOSIS — I25810 Atherosclerosis of coronary artery bypass graft(s) without angina pectoris: Secondary | ICD-10-CM | POA: Diagnosis not present

## 2017-10-07 DIAGNOSIS — E1165 Type 2 diabetes mellitus with hyperglycemia: Secondary | ICD-10-CM | POA: Diagnosis not present

## 2017-10-07 DIAGNOSIS — I130 Hypertensive heart and chronic kidney disease with heart failure and stage 1 through stage 4 chronic kidney disease, or unspecified chronic kidney disease: Secondary | ICD-10-CM | POA: Diagnosis not present

## 2017-10-07 DIAGNOSIS — E1151 Type 2 diabetes mellitus with diabetic peripheral angiopathy without gangrene: Secondary | ICD-10-CM | POA: Diagnosis not present

## 2017-10-07 DIAGNOSIS — Z7901 Long term (current) use of anticoagulants: Secondary | ICD-10-CM | POA: Diagnosis not present

## 2017-10-07 DIAGNOSIS — I5022 Chronic systolic (congestive) heart failure: Secondary | ICD-10-CM | POA: Diagnosis not present

## 2017-10-07 DIAGNOSIS — Z89511 Acquired absence of right leg below knee: Secondary | ICD-10-CM | POA: Diagnosis not present

## 2017-10-07 DIAGNOSIS — S81001D Unspecified open wound, right knee, subsequent encounter: Secondary | ICD-10-CM | POA: Diagnosis not present

## 2017-10-07 DIAGNOSIS — Z9981 Dependence on supplemental oxygen: Secondary | ICD-10-CM | POA: Diagnosis not present

## 2017-10-08 DIAGNOSIS — E1165 Type 2 diabetes mellitus with hyperglycemia: Secondary | ICD-10-CM | POA: Diagnosis not present

## 2017-10-08 DIAGNOSIS — K6289 Other specified diseases of anus and rectum: Secondary | ICD-10-CM | POA: Diagnosis not present

## 2017-10-08 DIAGNOSIS — K921 Melena: Secondary | ICD-10-CM | POA: Diagnosis not present

## 2017-10-08 DIAGNOSIS — Z794 Long term (current) use of insulin: Secondary | ICD-10-CM | POA: Diagnosis not present

## 2017-10-08 DIAGNOSIS — S81001D Unspecified open wound, right knee, subsequent encounter: Secondary | ICD-10-CM | POA: Diagnosis not present

## 2017-10-14 DIAGNOSIS — Z9981 Dependence on supplemental oxygen: Secondary | ICD-10-CM | POA: Diagnosis not present

## 2017-10-14 DIAGNOSIS — E1122 Type 2 diabetes mellitus with diabetic chronic kidney disease: Secondary | ICD-10-CM | POA: Diagnosis not present

## 2017-10-14 DIAGNOSIS — E1142 Type 2 diabetes mellitus with diabetic polyneuropathy: Secondary | ICD-10-CM | POA: Diagnosis not present

## 2017-10-14 DIAGNOSIS — Z87891 Personal history of nicotine dependence: Secondary | ICD-10-CM | POA: Diagnosis not present

## 2017-10-14 DIAGNOSIS — E1151 Type 2 diabetes mellitus with diabetic peripheral angiopathy without gangrene: Secondary | ICD-10-CM | POA: Diagnosis not present

## 2017-10-14 DIAGNOSIS — D510 Vitamin B12 deficiency anemia due to intrinsic factor deficiency: Secondary | ICD-10-CM | POA: Diagnosis not present

## 2017-10-14 DIAGNOSIS — Z7901 Long term (current) use of anticoagulants: Secondary | ICD-10-CM | POA: Diagnosis not present

## 2017-10-14 DIAGNOSIS — N183 Chronic kidney disease, stage 3 (moderate): Secondary | ICD-10-CM | POA: Diagnosis not present

## 2017-10-14 DIAGNOSIS — Z794 Long term (current) use of insulin: Secondary | ICD-10-CM | POA: Diagnosis not present

## 2017-10-14 DIAGNOSIS — I25810 Atherosclerosis of coronary artery bypass graft(s) without angina pectoris: Secondary | ICD-10-CM | POA: Diagnosis not present

## 2017-10-14 DIAGNOSIS — E1165 Type 2 diabetes mellitus with hyperglycemia: Secondary | ICD-10-CM | POA: Diagnosis not present

## 2017-10-14 DIAGNOSIS — N179 Acute kidney failure, unspecified: Secondary | ICD-10-CM | POA: Diagnosis not present

## 2017-10-14 DIAGNOSIS — Z86711 Personal history of pulmonary embolism: Secondary | ICD-10-CM | POA: Diagnosis not present

## 2017-10-14 DIAGNOSIS — I130 Hypertensive heart and chronic kidney disease with heart failure and stage 1 through stage 4 chronic kidney disease, or unspecified chronic kidney disease: Secondary | ICD-10-CM | POA: Diagnosis not present

## 2017-10-14 DIAGNOSIS — Z89511 Acquired absence of right leg below knee: Secondary | ICD-10-CM | POA: Diagnosis not present

## 2017-10-14 DIAGNOSIS — S81001D Unspecified open wound, right knee, subsequent encounter: Secondary | ICD-10-CM | POA: Diagnosis not present

## 2017-10-14 DIAGNOSIS — I5022 Chronic systolic (congestive) heart failure: Secondary | ICD-10-CM | POA: Diagnosis not present

## 2017-10-22 DIAGNOSIS — Z87891 Personal history of nicotine dependence: Secondary | ICD-10-CM | POA: Diagnosis not present

## 2017-10-22 DIAGNOSIS — S81001D Unspecified open wound, right knee, subsequent encounter: Secondary | ICD-10-CM | POA: Diagnosis not present

## 2017-10-22 DIAGNOSIS — Z9981 Dependence on supplemental oxygen: Secondary | ICD-10-CM | POA: Diagnosis not present

## 2017-10-22 DIAGNOSIS — I130 Hypertensive heart and chronic kidney disease with heart failure and stage 1 through stage 4 chronic kidney disease, or unspecified chronic kidney disease: Secondary | ICD-10-CM | POA: Diagnosis not present

## 2017-10-22 DIAGNOSIS — E1151 Type 2 diabetes mellitus with diabetic peripheral angiopathy without gangrene: Secondary | ICD-10-CM | POA: Diagnosis not present

## 2017-10-22 DIAGNOSIS — I5022 Chronic systolic (congestive) heart failure: Secondary | ICD-10-CM | POA: Diagnosis not present

## 2017-10-22 DIAGNOSIS — I25810 Atherosclerosis of coronary artery bypass graft(s) without angina pectoris: Secondary | ICD-10-CM | POA: Diagnosis not present

## 2017-10-22 DIAGNOSIS — E1165 Type 2 diabetes mellitus with hyperglycemia: Secondary | ICD-10-CM | POA: Diagnosis not present

## 2017-10-22 DIAGNOSIS — E1122 Type 2 diabetes mellitus with diabetic chronic kidney disease: Secondary | ICD-10-CM | POA: Diagnosis not present

## 2017-10-22 DIAGNOSIS — N179 Acute kidney failure, unspecified: Secondary | ICD-10-CM | POA: Diagnosis not present

## 2017-10-22 DIAGNOSIS — Z86711 Personal history of pulmonary embolism: Secondary | ICD-10-CM | POA: Diagnosis not present

## 2017-10-22 DIAGNOSIS — D510 Vitamin B12 deficiency anemia due to intrinsic factor deficiency: Secondary | ICD-10-CM | POA: Diagnosis not present

## 2017-10-22 DIAGNOSIS — Z89511 Acquired absence of right leg below knee: Secondary | ICD-10-CM | POA: Diagnosis not present

## 2017-10-22 DIAGNOSIS — N183 Chronic kidney disease, stage 3 (moderate): Secondary | ICD-10-CM | POA: Diagnosis not present

## 2017-10-22 DIAGNOSIS — E1142 Type 2 diabetes mellitus with diabetic polyneuropathy: Secondary | ICD-10-CM | POA: Diagnosis not present

## 2017-10-22 DIAGNOSIS — Z7901 Long term (current) use of anticoagulants: Secondary | ICD-10-CM | POA: Diagnosis not present

## 2017-10-22 DIAGNOSIS — Z794 Long term (current) use of insulin: Secondary | ICD-10-CM | POA: Diagnosis not present

## 2017-10-23 DIAGNOSIS — B351 Tinea unguium: Secondary | ICD-10-CM | POA: Diagnosis not present

## 2017-10-23 DIAGNOSIS — E1140 Type 2 diabetes mellitus with diabetic neuropathy, unspecified: Secondary | ICD-10-CM | POA: Diagnosis not present

## 2017-10-23 DIAGNOSIS — E11621 Type 2 diabetes mellitus with foot ulcer: Secondary | ICD-10-CM | POA: Diagnosis not present

## 2017-10-23 DIAGNOSIS — L97509 Non-pressure chronic ulcer of other part of unspecified foot with unspecified severity: Secondary | ICD-10-CM | POA: Diagnosis not present

## 2017-10-23 DIAGNOSIS — L98491 Non-pressure chronic ulcer of skin of other sites limited to breakdown of skin: Secondary | ICD-10-CM | POA: Diagnosis not present

## 2017-10-25 DIAGNOSIS — N179 Acute kidney failure, unspecified: Secondary | ICD-10-CM | POA: Diagnosis not present

## 2017-10-25 DIAGNOSIS — Z794 Long term (current) use of insulin: Secondary | ICD-10-CM | POA: Diagnosis not present

## 2017-10-25 DIAGNOSIS — Z87891 Personal history of nicotine dependence: Secondary | ICD-10-CM | POA: Diagnosis not present

## 2017-10-25 DIAGNOSIS — E1165 Type 2 diabetes mellitus with hyperglycemia: Secondary | ICD-10-CM | POA: Diagnosis not present

## 2017-10-25 DIAGNOSIS — S81001D Unspecified open wound, right knee, subsequent encounter: Secondary | ICD-10-CM | POA: Diagnosis not present

## 2017-10-25 DIAGNOSIS — I130 Hypertensive heart and chronic kidney disease with heart failure and stage 1 through stage 4 chronic kidney disease, or unspecified chronic kidney disease: Secondary | ICD-10-CM | POA: Diagnosis not present

## 2017-10-25 DIAGNOSIS — Z89511 Acquired absence of right leg below knee: Secondary | ICD-10-CM | POA: Diagnosis not present

## 2017-10-25 DIAGNOSIS — I25810 Atherosclerosis of coronary artery bypass graft(s) without angina pectoris: Secondary | ICD-10-CM | POA: Diagnosis not present

## 2017-10-25 DIAGNOSIS — I5022 Chronic systolic (congestive) heart failure: Secondary | ICD-10-CM | POA: Diagnosis not present

## 2017-10-25 DIAGNOSIS — E1122 Type 2 diabetes mellitus with diabetic chronic kidney disease: Secondary | ICD-10-CM | POA: Diagnosis not present

## 2017-10-25 DIAGNOSIS — D510 Vitamin B12 deficiency anemia due to intrinsic factor deficiency: Secondary | ICD-10-CM | POA: Diagnosis not present

## 2017-10-25 DIAGNOSIS — E1142 Type 2 diabetes mellitus with diabetic polyneuropathy: Secondary | ICD-10-CM | POA: Diagnosis not present

## 2017-10-25 DIAGNOSIS — E1151 Type 2 diabetes mellitus with diabetic peripheral angiopathy without gangrene: Secondary | ICD-10-CM | POA: Diagnosis not present

## 2017-10-25 DIAGNOSIS — Z9981 Dependence on supplemental oxygen: Secondary | ICD-10-CM | POA: Diagnosis not present

## 2017-10-25 DIAGNOSIS — N183 Chronic kidney disease, stage 3 (moderate): Secondary | ICD-10-CM | POA: Diagnosis not present

## 2017-10-25 DIAGNOSIS — Z7901 Long term (current) use of anticoagulants: Secondary | ICD-10-CM | POA: Diagnosis not present

## 2017-10-25 DIAGNOSIS — Z86711 Personal history of pulmonary embolism: Secondary | ICD-10-CM | POA: Diagnosis not present

## 2017-10-28 DIAGNOSIS — L98491 Non-pressure chronic ulcer of skin of other sites limited to breakdown of skin: Secondary | ICD-10-CM | POA: Diagnosis not present

## 2017-10-29 DIAGNOSIS — Z794 Long term (current) use of insulin: Secondary | ICD-10-CM | POA: Diagnosis not present

## 2017-10-29 DIAGNOSIS — S81001D Unspecified open wound, right knee, subsequent encounter: Secondary | ICD-10-CM | POA: Diagnosis not present

## 2017-10-29 DIAGNOSIS — Z89511 Acquired absence of right leg below knee: Secondary | ICD-10-CM | POA: Diagnosis not present

## 2017-10-29 DIAGNOSIS — E1122 Type 2 diabetes mellitus with diabetic chronic kidney disease: Secondary | ICD-10-CM | POA: Diagnosis not present

## 2017-10-29 DIAGNOSIS — E1142 Type 2 diabetes mellitus with diabetic polyneuropathy: Secondary | ICD-10-CM | POA: Diagnosis not present

## 2017-10-29 DIAGNOSIS — D510 Vitamin B12 deficiency anemia due to intrinsic factor deficiency: Secondary | ICD-10-CM | POA: Diagnosis not present

## 2017-10-29 DIAGNOSIS — I5022 Chronic systolic (congestive) heart failure: Secondary | ICD-10-CM | POA: Diagnosis not present

## 2017-10-29 DIAGNOSIS — I130 Hypertensive heart and chronic kidney disease with heart failure and stage 1 through stage 4 chronic kidney disease, or unspecified chronic kidney disease: Secondary | ICD-10-CM | POA: Diagnosis not present

## 2017-10-29 DIAGNOSIS — E1165 Type 2 diabetes mellitus with hyperglycemia: Secondary | ICD-10-CM | POA: Diagnosis not present

## 2017-10-29 DIAGNOSIS — I25810 Atherosclerosis of coronary artery bypass graft(s) without angina pectoris: Secondary | ICD-10-CM | POA: Diagnosis not present

## 2017-10-29 DIAGNOSIS — Z87891 Personal history of nicotine dependence: Secondary | ICD-10-CM | POA: Diagnosis not present

## 2017-10-29 DIAGNOSIS — E1151 Type 2 diabetes mellitus with diabetic peripheral angiopathy without gangrene: Secondary | ICD-10-CM | POA: Diagnosis not present

## 2017-10-29 DIAGNOSIS — N179 Acute kidney failure, unspecified: Secondary | ICD-10-CM | POA: Diagnosis not present

## 2017-10-29 DIAGNOSIS — Z86711 Personal history of pulmonary embolism: Secondary | ICD-10-CM | POA: Diagnosis not present

## 2017-10-29 DIAGNOSIS — N183 Chronic kidney disease, stage 3 (moderate): Secondary | ICD-10-CM | POA: Diagnosis not present

## 2017-10-29 DIAGNOSIS — Z7901 Long term (current) use of anticoagulants: Secondary | ICD-10-CM | POA: Diagnosis not present

## 2017-10-29 DIAGNOSIS — Z9981 Dependence on supplemental oxygen: Secondary | ICD-10-CM | POA: Diagnosis not present

## 2017-10-31 DIAGNOSIS — I2782 Chronic pulmonary embolism: Secondary | ICD-10-CM | POA: Diagnosis not present

## 2017-11-05 DIAGNOSIS — N183 Chronic kidney disease, stage 3 (moderate): Secondary | ICD-10-CM | POA: Diagnosis not present

## 2017-11-05 DIAGNOSIS — Z794 Long term (current) use of insulin: Secondary | ICD-10-CM | POA: Diagnosis not present

## 2017-11-05 DIAGNOSIS — I130 Hypertensive heart and chronic kidney disease with heart failure and stage 1 through stage 4 chronic kidney disease, or unspecified chronic kidney disease: Secondary | ICD-10-CM | POA: Diagnosis not present

## 2017-11-05 DIAGNOSIS — I25810 Atherosclerosis of coronary artery bypass graft(s) without angina pectoris: Secondary | ICD-10-CM | POA: Diagnosis not present

## 2017-11-05 DIAGNOSIS — Z7901 Long term (current) use of anticoagulants: Secondary | ICD-10-CM | POA: Diagnosis not present

## 2017-11-05 DIAGNOSIS — Z89511 Acquired absence of right leg below knee: Secondary | ICD-10-CM | POA: Diagnosis not present

## 2017-11-05 DIAGNOSIS — E1165 Type 2 diabetes mellitus with hyperglycemia: Secondary | ICD-10-CM | POA: Diagnosis not present

## 2017-11-05 DIAGNOSIS — E1151 Type 2 diabetes mellitus with diabetic peripheral angiopathy without gangrene: Secondary | ICD-10-CM | POA: Diagnosis not present

## 2017-11-05 DIAGNOSIS — Z87891 Personal history of nicotine dependence: Secondary | ICD-10-CM | POA: Diagnosis not present

## 2017-11-05 DIAGNOSIS — Z86711 Personal history of pulmonary embolism: Secondary | ICD-10-CM | POA: Diagnosis not present

## 2017-11-05 DIAGNOSIS — I5022 Chronic systolic (congestive) heart failure: Secondary | ICD-10-CM | POA: Diagnosis not present

## 2017-11-05 DIAGNOSIS — D510 Vitamin B12 deficiency anemia due to intrinsic factor deficiency: Secondary | ICD-10-CM | POA: Diagnosis not present

## 2017-11-05 DIAGNOSIS — E1142 Type 2 diabetes mellitus with diabetic polyneuropathy: Secondary | ICD-10-CM | POA: Diagnosis not present

## 2017-11-05 DIAGNOSIS — S81001D Unspecified open wound, right knee, subsequent encounter: Secondary | ICD-10-CM | POA: Diagnosis not present

## 2017-11-05 DIAGNOSIS — Z9981 Dependence on supplemental oxygen: Secondary | ICD-10-CM | POA: Diagnosis not present

## 2017-11-05 DIAGNOSIS — N179 Acute kidney failure, unspecified: Secondary | ICD-10-CM | POA: Diagnosis not present

## 2017-11-05 DIAGNOSIS — E1122 Type 2 diabetes mellitus with diabetic chronic kidney disease: Secondary | ICD-10-CM | POA: Diagnosis not present

## 2017-11-13 DIAGNOSIS — Z89511 Acquired absence of right leg below knee: Secondary | ICD-10-CM | POA: Diagnosis not present

## 2017-11-13 DIAGNOSIS — I5022 Chronic systolic (congestive) heart failure: Secondary | ICD-10-CM | POA: Diagnosis not present

## 2017-11-13 DIAGNOSIS — Z87891 Personal history of nicotine dependence: Secondary | ICD-10-CM | POA: Diagnosis not present

## 2017-11-13 DIAGNOSIS — I130 Hypertensive heart and chronic kidney disease with heart failure and stage 1 through stage 4 chronic kidney disease, or unspecified chronic kidney disease: Secondary | ICD-10-CM | POA: Diagnosis not present

## 2017-11-13 DIAGNOSIS — I25810 Atherosclerosis of coronary artery bypass graft(s) without angina pectoris: Secondary | ICD-10-CM | POA: Diagnosis not present

## 2017-11-13 DIAGNOSIS — Z794 Long term (current) use of insulin: Secondary | ICD-10-CM | POA: Diagnosis not present

## 2017-11-13 DIAGNOSIS — E1142 Type 2 diabetes mellitus with diabetic polyneuropathy: Secondary | ICD-10-CM | POA: Diagnosis not present

## 2017-11-13 DIAGNOSIS — E1151 Type 2 diabetes mellitus with diabetic peripheral angiopathy without gangrene: Secondary | ICD-10-CM | POA: Diagnosis not present

## 2017-11-13 DIAGNOSIS — N179 Acute kidney failure, unspecified: Secondary | ICD-10-CM | POA: Diagnosis not present

## 2017-11-13 DIAGNOSIS — N183 Chronic kidney disease, stage 3 (moderate): Secondary | ICD-10-CM | POA: Diagnosis not present

## 2017-11-13 DIAGNOSIS — D510 Vitamin B12 deficiency anemia due to intrinsic factor deficiency: Secondary | ICD-10-CM | POA: Diagnosis not present

## 2017-11-13 DIAGNOSIS — Z86711 Personal history of pulmonary embolism: Secondary | ICD-10-CM | POA: Diagnosis not present

## 2017-11-13 DIAGNOSIS — Z9981 Dependence on supplemental oxygen: Secondary | ICD-10-CM | POA: Diagnosis not present

## 2017-11-13 DIAGNOSIS — E1165 Type 2 diabetes mellitus with hyperglycemia: Secondary | ICD-10-CM | POA: Diagnosis not present

## 2017-11-13 DIAGNOSIS — Z7901 Long term (current) use of anticoagulants: Secondary | ICD-10-CM | POA: Diagnosis not present

## 2017-11-13 DIAGNOSIS — E1122 Type 2 diabetes mellitus with diabetic chronic kidney disease: Secondary | ICD-10-CM | POA: Diagnosis not present

## 2017-11-13 DIAGNOSIS — S81001D Unspecified open wound, right knee, subsequent encounter: Secondary | ICD-10-CM | POA: Diagnosis not present

## 2017-11-19 DIAGNOSIS — I5022 Chronic systolic (congestive) heart failure: Secondary | ICD-10-CM

## 2017-11-19 DIAGNOSIS — S199XXA Unspecified injury of neck, initial encounter: Secondary | ICD-10-CM | POA: Diagnosis not present

## 2017-11-19 DIAGNOSIS — M542 Cervicalgia: Secondary | ICD-10-CM | POA: Diagnosis not present

## 2017-11-19 DIAGNOSIS — E11622 Type 2 diabetes mellitus with other skin ulcer: Secondary | ICD-10-CM | POA: Diagnosis not present

## 2017-11-19 DIAGNOSIS — R42 Dizziness and giddiness: Secondary | ICD-10-CM | POA: Diagnosis not present

## 2017-11-19 DIAGNOSIS — N179 Acute kidney failure, unspecified: Secondary | ICD-10-CM | POA: Diagnosis not present

## 2017-11-19 DIAGNOSIS — E860 Dehydration: Secondary | ICD-10-CM | POA: Diagnosis not present

## 2017-11-19 DIAGNOSIS — E1140 Type 2 diabetes mellitus with diabetic neuropathy, unspecified: Secondary | ICD-10-CM

## 2017-11-19 DIAGNOSIS — E1151 Type 2 diabetes mellitus with diabetic peripheral angiopathy without gangrene: Secondary | ICD-10-CM

## 2017-11-19 DIAGNOSIS — R51 Headache: Secondary | ICD-10-CM | POA: Diagnosis not present

## 2017-11-19 DIAGNOSIS — R404 Transient alteration of awareness: Secondary | ICD-10-CM | POA: Diagnosis not present

## 2017-11-19 DIAGNOSIS — S0990XA Unspecified injury of head, initial encounter: Secondary | ICD-10-CM | POA: Diagnosis not present

## 2017-11-19 DIAGNOSIS — R001 Bradycardia, unspecified: Secondary | ICD-10-CM | POA: Diagnosis not present

## 2017-11-19 DIAGNOSIS — I10 Essential (primary) hypertension: Secondary | ICD-10-CM | POA: Diagnosis not present

## 2017-11-19 DIAGNOSIS — I2510 Atherosclerotic heart disease of native coronary artery without angina pectoris: Secondary | ICD-10-CM | POA: Diagnosis not present

## 2017-11-19 DIAGNOSIS — E785 Hyperlipidemia, unspecified: Secondary | ICD-10-CM | POA: Diagnosis not present

## 2017-11-19 DIAGNOSIS — I959 Hypotension, unspecified: Secondary | ICD-10-CM | POA: Diagnosis not present

## 2017-11-20 DIAGNOSIS — Z86718 Personal history of other venous thrombosis and embolism: Secondary | ICD-10-CM | POA: Diagnosis not present

## 2017-11-20 DIAGNOSIS — R42 Dizziness and giddiness: Secondary | ICD-10-CM | POA: Diagnosis not present

## 2017-11-20 DIAGNOSIS — Z79899 Other long term (current) drug therapy: Secondary | ICD-10-CM | POA: Diagnosis not present

## 2017-11-20 DIAGNOSIS — E785 Hyperlipidemia, unspecified: Secondary | ICD-10-CM | POA: Diagnosis not present

## 2017-11-20 DIAGNOSIS — I25118 Atherosclerotic heart disease of native coronary artery with other forms of angina pectoris: Secondary | ICD-10-CM | POA: Diagnosis not present

## 2017-11-20 DIAGNOSIS — I5022 Chronic systolic (congestive) heart failure: Secondary | ICD-10-CM | POA: Diagnosis not present

## 2017-11-20 DIAGNOSIS — R001 Bradycardia, unspecified: Secondary | ICD-10-CM | POA: Diagnosis not present

## 2017-11-20 DIAGNOSIS — Z7901 Long term (current) use of anticoagulants: Secondary | ICD-10-CM | POA: Diagnosis not present

## 2017-11-20 DIAGNOSIS — Z89511 Acquired absence of right leg below knee: Secondary | ICD-10-CM | POA: Diagnosis not present

## 2017-11-20 DIAGNOSIS — I2510 Atherosclerotic heart disease of native coronary artery without angina pectoris: Secondary | ICD-10-CM | POA: Diagnosis not present

## 2017-11-20 DIAGNOSIS — Z7982 Long term (current) use of aspirin: Secondary | ICD-10-CM | POA: Diagnosis not present

## 2017-11-20 DIAGNOSIS — E782 Mixed hyperlipidemia: Secondary | ICD-10-CM | POA: Diagnosis not present

## 2017-11-20 DIAGNOSIS — Z86711 Personal history of pulmonary embolism: Secondary | ICD-10-CM | POA: Diagnosis not present

## 2017-11-20 DIAGNOSIS — N179 Acute kidney failure, unspecified: Secondary | ICD-10-CM | POA: Diagnosis not present

## 2017-11-20 DIAGNOSIS — M542 Cervicalgia: Secondary | ICD-10-CM | POA: Diagnosis not present

## 2017-11-20 DIAGNOSIS — E11622 Type 2 diabetes mellitus with other skin ulcer: Secondary | ICD-10-CM | POA: Diagnosis not present

## 2017-11-20 DIAGNOSIS — E1122 Type 2 diabetes mellitus with diabetic chronic kidney disease: Secondary | ICD-10-CM | POA: Diagnosis not present

## 2017-11-20 DIAGNOSIS — E1140 Type 2 diabetes mellitus with diabetic neuropathy, unspecified: Secondary | ICD-10-CM | POA: Diagnosis not present

## 2017-11-20 DIAGNOSIS — S199XXA Unspecified injury of neck, initial encounter: Secondary | ICD-10-CM | POA: Diagnosis not present

## 2017-11-20 DIAGNOSIS — I130 Hypertensive heart and chronic kidney disease with heart failure and stage 1 through stage 4 chronic kidney disease, or unspecified chronic kidney disease: Secondary | ICD-10-CM | POA: Diagnosis not present

## 2017-11-20 DIAGNOSIS — N183 Chronic kidney disease, stage 3 (moderate): Secondary | ICD-10-CM | POA: Diagnosis not present

## 2017-11-20 DIAGNOSIS — E860 Dehydration: Secondary | ICD-10-CM | POA: Diagnosis not present

## 2017-11-20 DIAGNOSIS — L97909 Non-pressure chronic ulcer of unspecified part of unspecified lower leg with unspecified severity: Secondary | ICD-10-CM | POA: Diagnosis not present

## 2017-11-20 DIAGNOSIS — S0990XA Unspecified injury of head, initial encounter: Secondary | ICD-10-CM | POA: Diagnosis not present

## 2017-11-20 DIAGNOSIS — R51 Headache: Secondary | ICD-10-CM | POA: Diagnosis not present

## 2017-11-20 DIAGNOSIS — E1151 Type 2 diabetes mellitus with diabetic peripheral angiopathy without gangrene: Secondary | ICD-10-CM | POA: Diagnosis not present

## 2017-11-20 DIAGNOSIS — I959 Hypotension, unspecified: Secondary | ICD-10-CM | POA: Diagnosis not present

## 2017-11-20 DIAGNOSIS — Z87891 Personal history of nicotine dependence: Secondary | ICD-10-CM | POA: Diagnosis not present

## 2017-11-20 DIAGNOSIS — I10 Essential (primary) hypertension: Secondary | ICD-10-CM | POA: Diagnosis not present

## 2017-12-01 DIAGNOSIS — I2782 Chronic pulmonary embolism: Secondary | ICD-10-CM | POA: Diagnosis not present

## 2017-12-02 DIAGNOSIS — Z09 Encounter for follow-up examination after completed treatment for conditions other than malignant neoplasm: Secondary | ICD-10-CM | POA: Diagnosis not present

## 2017-12-02 DIAGNOSIS — N183 Chronic kidney disease, stage 3 (moderate): Secondary | ICD-10-CM | POA: Diagnosis not present

## 2017-12-02 DIAGNOSIS — I5022 Chronic systolic (congestive) heart failure: Secondary | ICD-10-CM | POA: Diagnosis not present

## 2017-12-02 DIAGNOSIS — E1122 Type 2 diabetes mellitus with diabetic chronic kidney disease: Secondary | ICD-10-CM | POA: Diagnosis not present

## 2017-12-02 DIAGNOSIS — I110 Hypertensive heart disease with heart failure: Secondary | ICD-10-CM | POA: Diagnosis not present

## 2017-12-09 DIAGNOSIS — I25810 Atherosclerosis of coronary artery bypass graft(s) without angina pectoris: Secondary | ICD-10-CM | POA: Diagnosis not present

## 2017-12-09 DIAGNOSIS — Z79899 Other long term (current) drug therapy: Secondary | ICD-10-CM | POA: Diagnosis not present

## 2017-12-09 DIAGNOSIS — I129 Hypertensive chronic kidney disease with stage 1 through stage 4 chronic kidney disease, or unspecified chronic kidney disease: Secondary | ICD-10-CM | POA: Diagnosis not present

## 2017-12-09 DIAGNOSIS — J439 Emphysema, unspecified: Secondary | ICD-10-CM | POA: Diagnosis not present

## 2017-12-09 DIAGNOSIS — I249 Acute ischemic heart disease, unspecified: Secondary | ICD-10-CM | POA: Diagnosis not present

## 2017-12-09 DIAGNOSIS — E785 Hyperlipidemia, unspecified: Secondary | ICD-10-CM | POA: Diagnosis not present

## 2017-12-09 DIAGNOSIS — N183 Chronic kidney disease, stage 3 (moderate): Secondary | ICD-10-CM | POA: Diagnosis not present

## 2017-12-09 DIAGNOSIS — Z89511 Acquired absence of right leg below knee: Secondary | ICD-10-CM | POA: Diagnosis not present

## 2017-12-09 DIAGNOSIS — Z86711 Personal history of pulmonary embolism: Secondary | ICD-10-CM | POA: Diagnosis not present

## 2017-12-09 DIAGNOSIS — E1122 Type 2 diabetes mellitus with diabetic chronic kidney disease: Secondary | ICD-10-CM | POA: Diagnosis not present

## 2017-12-09 DIAGNOSIS — I5022 Chronic systolic (congestive) heart failure: Secondary | ICD-10-CM | POA: Diagnosis not present

## 2017-12-09 DIAGNOSIS — S0990XA Unspecified injury of head, initial encounter: Secondary | ICD-10-CM | POA: Diagnosis not present

## 2017-12-09 DIAGNOSIS — E1142 Type 2 diabetes mellitus with diabetic polyneuropathy: Secondary | ICD-10-CM | POA: Diagnosis not present

## 2017-12-09 DIAGNOSIS — Z7901 Long term (current) use of anticoagulants: Secondary | ICD-10-CM | POA: Diagnosis not present

## 2017-12-09 DIAGNOSIS — I951 Orthostatic hypotension: Secondary | ICD-10-CM | POA: Diagnosis not present

## 2017-12-09 DIAGNOSIS — Z794 Long term (current) use of insulin: Secondary | ICD-10-CM | POA: Diagnosis not present

## 2017-12-09 DIAGNOSIS — R55 Syncope and collapse: Secondary | ICD-10-CM | POA: Diagnosis not present

## 2017-12-09 DIAGNOSIS — R079 Chest pain, unspecified: Secondary | ICD-10-CM | POA: Diagnosis not present

## 2017-12-09 DIAGNOSIS — R0789 Other chest pain: Secondary | ICD-10-CM | POA: Diagnosis not present

## 2017-12-09 DIAGNOSIS — I252 Old myocardial infarction: Secondary | ICD-10-CM | POA: Diagnosis not present

## 2017-12-10 DIAGNOSIS — I951 Orthostatic hypotension: Secondary | ICD-10-CM | POA: Diagnosis not present

## 2017-12-10 DIAGNOSIS — R079 Chest pain, unspecified: Secondary | ICD-10-CM | POA: Diagnosis not present

## 2017-12-10 DIAGNOSIS — R0789 Other chest pain: Secondary | ICD-10-CM | POA: Diagnosis not present

## 2017-12-11 DIAGNOSIS — I951 Orthostatic hypotension: Secondary | ICD-10-CM | POA: Diagnosis not present

## 2017-12-11 DIAGNOSIS — R0789 Other chest pain: Secondary | ICD-10-CM | POA: Diagnosis not present

## 2017-12-13 DIAGNOSIS — E1142 Type 2 diabetes mellitus with diabetic polyneuropathy: Secondary | ICD-10-CM | POA: Diagnosis not present

## 2017-12-13 DIAGNOSIS — R001 Bradycardia, unspecified: Secondary | ICD-10-CM | POA: Diagnosis not present

## 2017-12-13 DIAGNOSIS — Z89511 Acquired absence of right leg below knee: Secondary | ICD-10-CM | POA: Diagnosis not present

## 2017-12-13 DIAGNOSIS — I25810 Atherosclerosis of coronary artery bypass graft(s) without angina pectoris: Secondary | ICD-10-CM | POA: Diagnosis not present

## 2017-12-13 DIAGNOSIS — I130 Hypertensive heart and chronic kidney disease with heart failure and stage 1 through stage 4 chronic kidney disease, or unspecified chronic kidney disease: Secondary | ICD-10-CM | POA: Diagnosis not present

## 2017-12-13 DIAGNOSIS — E1122 Type 2 diabetes mellitus with diabetic chronic kidney disease: Secondary | ICD-10-CM | POA: Diagnosis not present

## 2017-12-13 DIAGNOSIS — I110 Hypertensive heart disease with heart failure: Secondary | ICD-10-CM | POA: Diagnosis not present

## 2017-12-13 DIAGNOSIS — Z951 Presence of aortocoronary bypass graft: Secondary | ICD-10-CM | POA: Diagnosis not present

## 2017-12-13 DIAGNOSIS — Z87891 Personal history of nicotine dependence: Secondary | ICD-10-CM | POA: Diagnosis not present

## 2017-12-13 DIAGNOSIS — Z7982 Long term (current) use of aspirin: Secondary | ICD-10-CM | POA: Diagnosis not present

## 2017-12-13 DIAGNOSIS — N183 Chronic kidney disease, stage 3 (moderate): Secondary | ICD-10-CM | POA: Diagnosis not present

## 2017-12-13 DIAGNOSIS — E1151 Type 2 diabetes mellitus with diabetic peripheral angiopathy without gangrene: Secondary | ICD-10-CM | POA: Diagnosis not present

## 2017-12-13 DIAGNOSIS — G4733 Obstructive sleep apnea (adult) (pediatric): Secondary | ICD-10-CM | POA: Diagnosis not present

## 2017-12-13 DIAGNOSIS — Z7901 Long term (current) use of anticoagulants: Secondary | ICD-10-CM | POA: Diagnosis not present

## 2017-12-13 DIAGNOSIS — Z86711 Personal history of pulmonary embolism: Secondary | ICD-10-CM | POA: Diagnosis not present

## 2017-12-13 DIAGNOSIS — Z9981 Dependence on supplemental oxygen: Secondary | ICD-10-CM | POA: Diagnosis not present

## 2017-12-13 DIAGNOSIS — I951 Orthostatic hypotension: Secondary | ICD-10-CM | POA: Diagnosis not present

## 2017-12-13 DIAGNOSIS — I2510 Atherosclerotic heart disease of native coronary artery without angina pectoris: Secondary | ICD-10-CM | POA: Diagnosis not present

## 2017-12-13 DIAGNOSIS — I5022 Chronic systolic (congestive) heart failure: Secondary | ICD-10-CM | POA: Diagnosis not present

## 2017-12-13 DIAGNOSIS — Z794 Long term (current) use of insulin: Secondary | ICD-10-CM | POA: Diagnosis not present

## 2017-12-13 DIAGNOSIS — D510 Vitamin B12 deficiency anemia due to intrinsic factor deficiency: Secondary | ICD-10-CM | POA: Diagnosis not present

## 2017-12-16 DIAGNOSIS — Z794 Long term (current) use of insulin: Secondary | ICD-10-CM | POA: Diagnosis not present

## 2017-12-16 DIAGNOSIS — I5022 Chronic systolic (congestive) heart failure: Secondary | ICD-10-CM | POA: Diagnosis not present

## 2017-12-16 DIAGNOSIS — Z951 Presence of aortocoronary bypass graft: Secondary | ICD-10-CM | POA: Diagnosis not present

## 2017-12-16 DIAGNOSIS — Z7901 Long term (current) use of anticoagulants: Secondary | ICD-10-CM | POA: Diagnosis not present

## 2017-12-16 DIAGNOSIS — I2510 Atherosclerotic heart disease of native coronary artery without angina pectoris: Secondary | ICD-10-CM | POA: Diagnosis not present

## 2017-12-16 DIAGNOSIS — N183 Chronic kidney disease, stage 3 (moderate): Secondary | ICD-10-CM | POA: Diagnosis not present

## 2017-12-16 DIAGNOSIS — E1142 Type 2 diabetes mellitus with diabetic polyneuropathy: Secondary | ICD-10-CM | POA: Diagnosis not present

## 2017-12-16 DIAGNOSIS — E1151 Type 2 diabetes mellitus with diabetic peripheral angiopathy without gangrene: Secondary | ICD-10-CM | POA: Diagnosis not present

## 2017-12-16 DIAGNOSIS — I130 Hypertensive heart and chronic kidney disease with heart failure and stage 1 through stage 4 chronic kidney disease, or unspecified chronic kidney disease: Secondary | ICD-10-CM | POA: Diagnosis not present

## 2017-12-16 DIAGNOSIS — I951 Orthostatic hypotension: Secondary | ICD-10-CM | POA: Diagnosis not present

## 2017-12-16 DIAGNOSIS — Z7982 Long term (current) use of aspirin: Secondary | ICD-10-CM | POA: Diagnosis not present

## 2017-12-16 DIAGNOSIS — Z9981 Dependence on supplemental oxygen: Secondary | ICD-10-CM | POA: Diagnosis not present

## 2017-12-16 DIAGNOSIS — E1122 Type 2 diabetes mellitus with diabetic chronic kidney disease: Secondary | ICD-10-CM | POA: Diagnosis not present

## 2017-12-16 DIAGNOSIS — Z87891 Personal history of nicotine dependence: Secondary | ICD-10-CM | POA: Diagnosis not present

## 2017-12-16 DIAGNOSIS — Z86711 Personal history of pulmonary embolism: Secondary | ICD-10-CM | POA: Diagnosis not present

## 2017-12-16 DIAGNOSIS — Z89511 Acquired absence of right leg below knee: Secondary | ICD-10-CM | POA: Diagnosis not present

## 2017-12-16 DIAGNOSIS — D510 Vitamin B12 deficiency anemia due to intrinsic factor deficiency: Secondary | ICD-10-CM | POA: Diagnosis not present

## 2017-12-18 DIAGNOSIS — R001 Bradycardia, unspecified: Secondary | ICD-10-CM | POA: Diagnosis not present

## 2017-12-19 DIAGNOSIS — Z87891 Personal history of nicotine dependence: Secondary | ICD-10-CM | POA: Diagnosis not present

## 2017-12-19 DIAGNOSIS — Z89511 Acquired absence of right leg below knee: Secondary | ICD-10-CM | POA: Diagnosis not present

## 2017-12-19 DIAGNOSIS — Z86711 Personal history of pulmonary embolism: Secondary | ICD-10-CM | POA: Diagnosis not present

## 2017-12-19 DIAGNOSIS — D510 Vitamin B12 deficiency anemia due to intrinsic factor deficiency: Secondary | ICD-10-CM | POA: Diagnosis not present

## 2017-12-19 DIAGNOSIS — Z7982 Long term (current) use of aspirin: Secondary | ICD-10-CM | POA: Diagnosis not present

## 2017-12-19 DIAGNOSIS — I951 Orthostatic hypotension: Secondary | ICD-10-CM | POA: Diagnosis not present

## 2017-12-19 DIAGNOSIS — I2510 Atherosclerotic heart disease of native coronary artery without angina pectoris: Secondary | ICD-10-CM | POA: Diagnosis not present

## 2017-12-19 DIAGNOSIS — Z7901 Long term (current) use of anticoagulants: Secondary | ICD-10-CM | POA: Diagnosis not present

## 2017-12-19 DIAGNOSIS — E1142 Type 2 diabetes mellitus with diabetic polyneuropathy: Secondary | ICD-10-CM | POA: Diagnosis not present

## 2017-12-19 DIAGNOSIS — Z951 Presence of aortocoronary bypass graft: Secondary | ICD-10-CM | POA: Diagnosis not present

## 2017-12-19 DIAGNOSIS — N183 Chronic kidney disease, stage 3 (moderate): Secondary | ICD-10-CM | POA: Diagnosis not present

## 2017-12-19 DIAGNOSIS — I130 Hypertensive heart and chronic kidney disease with heart failure and stage 1 through stage 4 chronic kidney disease, or unspecified chronic kidney disease: Secondary | ICD-10-CM | POA: Diagnosis not present

## 2017-12-19 DIAGNOSIS — I5022 Chronic systolic (congestive) heart failure: Secondary | ICD-10-CM | POA: Diagnosis not present

## 2017-12-19 DIAGNOSIS — E1122 Type 2 diabetes mellitus with diabetic chronic kidney disease: Secondary | ICD-10-CM | POA: Diagnosis not present

## 2017-12-19 DIAGNOSIS — Z794 Long term (current) use of insulin: Secondary | ICD-10-CM | POA: Diagnosis not present

## 2017-12-19 DIAGNOSIS — E1151 Type 2 diabetes mellitus with diabetic peripheral angiopathy without gangrene: Secondary | ICD-10-CM | POA: Diagnosis not present

## 2017-12-19 DIAGNOSIS — Z9981 Dependence on supplemental oxygen: Secondary | ICD-10-CM | POA: Diagnosis not present

## 2017-12-23 DIAGNOSIS — Z951 Presence of aortocoronary bypass graft: Secondary | ICD-10-CM | POA: Diagnosis not present

## 2017-12-23 DIAGNOSIS — I5022 Chronic systolic (congestive) heart failure: Secondary | ICD-10-CM | POA: Diagnosis not present

## 2017-12-23 DIAGNOSIS — I130 Hypertensive heart and chronic kidney disease with heart failure and stage 1 through stage 4 chronic kidney disease, or unspecified chronic kidney disease: Secondary | ICD-10-CM | POA: Diagnosis not present

## 2017-12-23 DIAGNOSIS — D510 Vitamin B12 deficiency anemia due to intrinsic factor deficiency: Secondary | ICD-10-CM | POA: Diagnosis not present

## 2017-12-23 DIAGNOSIS — S80211S Abrasion, right knee, sequela: Secondary | ICD-10-CM | POA: Diagnosis not present

## 2017-12-23 DIAGNOSIS — Z9981 Dependence on supplemental oxygen: Secondary | ICD-10-CM | POA: Diagnosis not present

## 2017-12-23 DIAGNOSIS — E1151 Type 2 diabetes mellitus with diabetic peripheral angiopathy without gangrene: Secondary | ICD-10-CM | POA: Diagnosis not present

## 2017-12-23 DIAGNOSIS — E1122 Type 2 diabetes mellitus with diabetic chronic kidney disease: Secondary | ICD-10-CM | POA: Diagnosis not present

## 2017-12-23 DIAGNOSIS — I951 Orthostatic hypotension: Secondary | ICD-10-CM | POA: Diagnosis not present

## 2017-12-23 DIAGNOSIS — N183 Chronic kidney disease, stage 3 (moderate): Secondary | ICD-10-CM | POA: Diagnosis not present

## 2017-12-23 DIAGNOSIS — Z86711 Personal history of pulmonary embolism: Secondary | ICD-10-CM | POA: Diagnosis not present

## 2017-12-23 DIAGNOSIS — I2510 Atherosclerotic heart disease of native coronary artery without angina pectoris: Secondary | ICD-10-CM | POA: Diagnosis not present

## 2017-12-23 DIAGNOSIS — I9589 Other hypotension: Secondary | ICD-10-CM | POA: Diagnosis not present

## 2017-12-23 DIAGNOSIS — E1142 Type 2 diabetes mellitus with diabetic polyneuropathy: Secondary | ICD-10-CM | POA: Diagnosis not present

## 2017-12-23 DIAGNOSIS — Z7982 Long term (current) use of aspirin: Secondary | ICD-10-CM | POA: Diagnosis not present

## 2017-12-23 DIAGNOSIS — Z7901 Long term (current) use of anticoagulants: Secondary | ICD-10-CM | POA: Diagnosis not present

## 2017-12-23 DIAGNOSIS — Z87891 Personal history of nicotine dependence: Secondary | ICD-10-CM | POA: Diagnosis not present

## 2017-12-23 DIAGNOSIS — Z89511 Acquired absence of right leg below knee: Secondary | ICD-10-CM | POA: Diagnosis not present

## 2017-12-23 DIAGNOSIS — Z794 Long term (current) use of insulin: Secondary | ICD-10-CM | POA: Diagnosis not present

## 2017-12-25 DIAGNOSIS — Z9981 Dependence on supplemental oxygen: Secondary | ICD-10-CM | POA: Diagnosis not present

## 2017-12-25 DIAGNOSIS — I2510 Atherosclerotic heart disease of native coronary artery without angina pectoris: Secondary | ICD-10-CM | POA: Diagnosis not present

## 2017-12-25 DIAGNOSIS — I130 Hypertensive heart and chronic kidney disease with heart failure and stage 1 through stage 4 chronic kidney disease, or unspecified chronic kidney disease: Secondary | ICD-10-CM | POA: Diagnosis not present

## 2017-12-25 DIAGNOSIS — Z794 Long term (current) use of insulin: Secondary | ICD-10-CM | POA: Diagnosis not present

## 2017-12-25 DIAGNOSIS — D510 Vitamin B12 deficiency anemia due to intrinsic factor deficiency: Secondary | ICD-10-CM | POA: Diagnosis not present

## 2017-12-25 DIAGNOSIS — I5022 Chronic systolic (congestive) heart failure: Secondary | ICD-10-CM | POA: Diagnosis not present

## 2017-12-25 DIAGNOSIS — N183 Chronic kidney disease, stage 3 (moderate): Secondary | ICD-10-CM | POA: Diagnosis not present

## 2017-12-25 DIAGNOSIS — E1142 Type 2 diabetes mellitus with diabetic polyneuropathy: Secondary | ICD-10-CM | POA: Diagnosis not present

## 2017-12-25 DIAGNOSIS — E1151 Type 2 diabetes mellitus with diabetic peripheral angiopathy without gangrene: Secondary | ICD-10-CM | POA: Diagnosis not present

## 2017-12-25 DIAGNOSIS — Z7982 Long term (current) use of aspirin: Secondary | ICD-10-CM | POA: Diagnosis not present

## 2017-12-25 DIAGNOSIS — Z86711 Personal history of pulmonary embolism: Secondary | ICD-10-CM | POA: Diagnosis not present

## 2017-12-25 DIAGNOSIS — Z951 Presence of aortocoronary bypass graft: Secondary | ICD-10-CM | POA: Diagnosis not present

## 2017-12-25 DIAGNOSIS — E1122 Type 2 diabetes mellitus with diabetic chronic kidney disease: Secondary | ICD-10-CM | POA: Diagnosis not present

## 2017-12-25 DIAGNOSIS — Z89511 Acquired absence of right leg below knee: Secondary | ICD-10-CM | POA: Diagnosis not present

## 2017-12-25 DIAGNOSIS — I951 Orthostatic hypotension: Secondary | ICD-10-CM | POA: Diagnosis not present

## 2017-12-25 DIAGNOSIS — Z87891 Personal history of nicotine dependence: Secondary | ICD-10-CM | POA: Diagnosis not present

## 2017-12-25 DIAGNOSIS — Z7901 Long term (current) use of anticoagulants: Secondary | ICD-10-CM | POA: Diagnosis not present

## 2017-12-31 DIAGNOSIS — N183 Chronic kidney disease, stage 3 (moderate): Secondary | ICD-10-CM | POA: Diagnosis not present

## 2017-12-31 DIAGNOSIS — Z89511 Acquired absence of right leg below knee: Secondary | ICD-10-CM | POA: Diagnosis not present

## 2017-12-31 DIAGNOSIS — Z7982 Long term (current) use of aspirin: Secondary | ICD-10-CM | POA: Diagnosis not present

## 2017-12-31 DIAGNOSIS — D510 Vitamin B12 deficiency anemia due to intrinsic factor deficiency: Secondary | ICD-10-CM | POA: Diagnosis not present

## 2017-12-31 DIAGNOSIS — I2510 Atherosclerotic heart disease of native coronary artery without angina pectoris: Secondary | ICD-10-CM | POA: Diagnosis not present

## 2017-12-31 DIAGNOSIS — Z951 Presence of aortocoronary bypass graft: Secondary | ICD-10-CM | POA: Diagnosis not present

## 2017-12-31 DIAGNOSIS — Z7901 Long term (current) use of anticoagulants: Secondary | ICD-10-CM | POA: Diagnosis not present

## 2017-12-31 DIAGNOSIS — Z87891 Personal history of nicotine dependence: Secondary | ICD-10-CM | POA: Diagnosis not present

## 2017-12-31 DIAGNOSIS — E1142 Type 2 diabetes mellitus with diabetic polyneuropathy: Secondary | ICD-10-CM | POA: Diagnosis not present

## 2017-12-31 DIAGNOSIS — Z9981 Dependence on supplemental oxygen: Secondary | ICD-10-CM | POA: Diagnosis not present

## 2017-12-31 DIAGNOSIS — Z86711 Personal history of pulmonary embolism: Secondary | ICD-10-CM | POA: Diagnosis not present

## 2017-12-31 DIAGNOSIS — I951 Orthostatic hypotension: Secondary | ICD-10-CM | POA: Diagnosis not present

## 2017-12-31 DIAGNOSIS — I130 Hypertensive heart and chronic kidney disease with heart failure and stage 1 through stage 4 chronic kidney disease, or unspecified chronic kidney disease: Secondary | ICD-10-CM | POA: Diagnosis not present

## 2017-12-31 DIAGNOSIS — E1122 Type 2 diabetes mellitus with diabetic chronic kidney disease: Secondary | ICD-10-CM | POA: Diagnosis not present

## 2017-12-31 DIAGNOSIS — I2782 Chronic pulmonary embolism: Secondary | ICD-10-CM | POA: Diagnosis not present

## 2017-12-31 DIAGNOSIS — I5022 Chronic systolic (congestive) heart failure: Secondary | ICD-10-CM | POA: Diagnosis not present

## 2017-12-31 DIAGNOSIS — E1151 Type 2 diabetes mellitus with diabetic peripheral angiopathy without gangrene: Secondary | ICD-10-CM | POA: Diagnosis not present

## 2017-12-31 DIAGNOSIS — Z794 Long term (current) use of insulin: Secondary | ICD-10-CM | POA: Diagnosis not present

## 2018-01-02 DIAGNOSIS — E1151 Type 2 diabetes mellitus with diabetic peripheral angiopathy without gangrene: Secondary | ICD-10-CM | POA: Diagnosis not present

## 2018-01-02 DIAGNOSIS — Z87891 Personal history of nicotine dependence: Secondary | ICD-10-CM | POA: Diagnosis not present

## 2018-01-02 DIAGNOSIS — I5022 Chronic systolic (congestive) heart failure: Secondary | ICD-10-CM | POA: Diagnosis not present

## 2018-01-02 DIAGNOSIS — E1122 Type 2 diabetes mellitus with diabetic chronic kidney disease: Secondary | ICD-10-CM | POA: Diagnosis not present

## 2018-01-02 DIAGNOSIS — Z7901 Long term (current) use of anticoagulants: Secondary | ICD-10-CM | POA: Diagnosis not present

## 2018-01-02 DIAGNOSIS — Z794 Long term (current) use of insulin: Secondary | ICD-10-CM | POA: Diagnosis not present

## 2018-01-02 DIAGNOSIS — Z7982 Long term (current) use of aspirin: Secondary | ICD-10-CM | POA: Diagnosis not present

## 2018-01-02 DIAGNOSIS — D510 Vitamin B12 deficiency anemia due to intrinsic factor deficiency: Secondary | ICD-10-CM | POA: Diagnosis not present

## 2018-01-02 DIAGNOSIS — Z86711 Personal history of pulmonary embolism: Secondary | ICD-10-CM | POA: Diagnosis not present

## 2018-01-02 DIAGNOSIS — I2510 Atherosclerotic heart disease of native coronary artery without angina pectoris: Secondary | ICD-10-CM | POA: Diagnosis not present

## 2018-01-02 DIAGNOSIS — E1142 Type 2 diabetes mellitus with diabetic polyneuropathy: Secondary | ICD-10-CM | POA: Diagnosis not present

## 2018-01-02 DIAGNOSIS — Z89511 Acquired absence of right leg below knee: Secondary | ICD-10-CM | POA: Diagnosis not present

## 2018-01-02 DIAGNOSIS — Z951 Presence of aortocoronary bypass graft: Secondary | ICD-10-CM | POA: Diagnosis not present

## 2018-01-02 DIAGNOSIS — Z9981 Dependence on supplemental oxygen: Secondary | ICD-10-CM | POA: Diagnosis not present

## 2018-01-02 DIAGNOSIS — N183 Chronic kidney disease, stage 3 (moderate): Secondary | ICD-10-CM | POA: Diagnosis not present

## 2018-01-02 DIAGNOSIS — I951 Orthostatic hypotension: Secondary | ICD-10-CM | POA: Diagnosis not present

## 2018-01-02 DIAGNOSIS — I130 Hypertensive heart and chronic kidney disease with heart failure and stage 1 through stage 4 chronic kidney disease, or unspecified chronic kidney disease: Secondary | ICD-10-CM | POA: Diagnosis not present

## 2018-01-08 DIAGNOSIS — I130 Hypertensive heart and chronic kidney disease with heart failure and stage 1 through stage 4 chronic kidney disease, or unspecified chronic kidney disease: Secondary | ICD-10-CM | POA: Diagnosis not present

## 2018-01-08 DIAGNOSIS — E1122 Type 2 diabetes mellitus with diabetic chronic kidney disease: Secondary | ICD-10-CM | POA: Diagnosis not present

## 2018-01-08 DIAGNOSIS — Z7982 Long term (current) use of aspirin: Secondary | ICD-10-CM | POA: Diagnosis not present

## 2018-01-08 DIAGNOSIS — Z794 Long term (current) use of insulin: Secondary | ICD-10-CM | POA: Diagnosis not present

## 2018-01-08 DIAGNOSIS — Z951 Presence of aortocoronary bypass graft: Secondary | ICD-10-CM | POA: Diagnosis not present

## 2018-01-08 DIAGNOSIS — Z89511 Acquired absence of right leg below knee: Secondary | ICD-10-CM | POA: Diagnosis not present

## 2018-01-08 DIAGNOSIS — Z86711 Personal history of pulmonary embolism: Secondary | ICD-10-CM | POA: Diagnosis not present

## 2018-01-08 DIAGNOSIS — E1142 Type 2 diabetes mellitus with diabetic polyneuropathy: Secondary | ICD-10-CM | POA: Diagnosis not present

## 2018-01-08 DIAGNOSIS — Z7901 Long term (current) use of anticoagulants: Secondary | ICD-10-CM | POA: Diagnosis not present

## 2018-01-08 DIAGNOSIS — I5022 Chronic systolic (congestive) heart failure: Secondary | ICD-10-CM | POA: Diagnosis not present

## 2018-01-08 DIAGNOSIS — Z87891 Personal history of nicotine dependence: Secondary | ICD-10-CM | POA: Diagnosis not present

## 2018-01-08 DIAGNOSIS — D510 Vitamin B12 deficiency anemia due to intrinsic factor deficiency: Secondary | ICD-10-CM | POA: Diagnosis not present

## 2018-01-08 DIAGNOSIS — Z9981 Dependence on supplemental oxygen: Secondary | ICD-10-CM | POA: Diagnosis not present

## 2018-01-08 DIAGNOSIS — I2510 Atherosclerotic heart disease of native coronary artery without angina pectoris: Secondary | ICD-10-CM | POA: Diagnosis not present

## 2018-01-08 DIAGNOSIS — I951 Orthostatic hypotension: Secondary | ICD-10-CM | POA: Diagnosis not present

## 2018-01-08 DIAGNOSIS — E1151 Type 2 diabetes mellitus with diabetic peripheral angiopathy without gangrene: Secondary | ICD-10-CM | POA: Diagnosis not present

## 2018-01-08 DIAGNOSIS — N183 Chronic kidney disease, stage 3 (moderate): Secondary | ICD-10-CM | POA: Diagnosis not present

## 2018-01-09 DIAGNOSIS — N183 Chronic kidney disease, stage 3 (moderate): Secondary | ICD-10-CM | POA: Diagnosis not present

## 2018-01-09 DIAGNOSIS — I9589 Other hypotension: Secondary | ICD-10-CM | POA: Diagnosis not present

## 2018-01-09 DIAGNOSIS — Z794 Long term (current) use of insulin: Secondary | ICD-10-CM | POA: Diagnosis not present

## 2018-01-09 DIAGNOSIS — I25810 Atherosclerosis of coronary artery bypass graft(s) without angina pectoris: Secondary | ICD-10-CM | POA: Diagnosis not present

## 2018-01-09 DIAGNOSIS — E1122 Type 2 diabetes mellitus with diabetic chronic kidney disease: Secondary | ICD-10-CM | POA: Diagnosis not present

## 2018-01-14 DIAGNOSIS — I959 Hypotension, unspecified: Secondary | ICD-10-CM | POA: Diagnosis not present

## 2018-01-14 DIAGNOSIS — Z7901 Long term (current) use of anticoagulants: Secondary | ICD-10-CM | POA: Diagnosis not present

## 2018-01-14 DIAGNOSIS — Z951 Presence of aortocoronary bypass graft: Secondary | ICD-10-CM | POA: Diagnosis not present

## 2018-01-14 DIAGNOSIS — I130 Hypertensive heart and chronic kidney disease with heart failure and stage 1 through stage 4 chronic kidney disease, or unspecified chronic kidney disease: Secondary | ICD-10-CM | POA: Diagnosis not present

## 2018-01-14 DIAGNOSIS — R296 Repeated falls: Secondary | ICD-10-CM | POA: Diagnosis not present

## 2018-01-14 DIAGNOSIS — I129 Hypertensive chronic kidney disease with stage 1 through stage 4 chronic kidney disease, or unspecified chronic kidney disease: Secondary | ICD-10-CM | POA: Diagnosis not present

## 2018-01-14 DIAGNOSIS — Z9981 Dependence on supplemental oxygen: Secondary | ICD-10-CM | POA: Diagnosis not present

## 2018-01-14 DIAGNOSIS — R531 Weakness: Secondary | ICD-10-CM | POA: Diagnosis not present

## 2018-01-14 DIAGNOSIS — N179 Acute kidney failure, unspecified: Secondary | ICD-10-CM | POA: Diagnosis not present

## 2018-01-14 DIAGNOSIS — Z87891 Personal history of nicotine dependence: Secondary | ICD-10-CM | POA: Diagnosis not present

## 2018-01-14 DIAGNOSIS — Z86711 Personal history of pulmonary embolism: Secondary | ICD-10-CM | POA: Diagnosis not present

## 2018-01-14 DIAGNOSIS — E1142 Type 2 diabetes mellitus with diabetic polyneuropathy: Secondary | ICD-10-CM | POA: Diagnosis not present

## 2018-01-14 DIAGNOSIS — I5022 Chronic systolic (congestive) heart failure: Secondary | ICD-10-CM | POA: Diagnosis not present

## 2018-01-14 DIAGNOSIS — I2510 Atherosclerotic heart disease of native coronary artery without angina pectoris: Secondary | ICD-10-CM | POA: Diagnosis not present

## 2018-01-14 DIAGNOSIS — Z794 Long term (current) use of insulin: Secondary | ICD-10-CM | POA: Diagnosis not present

## 2018-01-14 DIAGNOSIS — E1122 Type 2 diabetes mellitus with diabetic chronic kidney disease: Secondary | ICD-10-CM | POA: Diagnosis not present

## 2018-01-14 DIAGNOSIS — N183 Chronic kidney disease, stage 3 (moderate): Secondary | ICD-10-CM | POA: Diagnosis not present

## 2018-01-14 DIAGNOSIS — Z7982 Long term (current) use of aspirin: Secondary | ICD-10-CM | POA: Diagnosis not present

## 2018-01-14 DIAGNOSIS — N189 Chronic kidney disease, unspecified: Secondary | ICD-10-CM | POA: Diagnosis not present

## 2018-01-14 DIAGNOSIS — D510 Vitamin B12 deficiency anemia due to intrinsic factor deficiency: Secondary | ICD-10-CM | POA: Diagnosis not present

## 2018-01-14 DIAGNOSIS — E1151 Type 2 diabetes mellitus with diabetic peripheral angiopathy without gangrene: Secondary | ICD-10-CM | POA: Diagnosis not present

## 2018-01-14 DIAGNOSIS — Z89511 Acquired absence of right leg below knee: Secondary | ICD-10-CM | POA: Diagnosis not present

## 2018-01-14 DIAGNOSIS — I951 Orthostatic hypotension: Secondary | ICD-10-CM | POA: Diagnosis not present

## 2018-01-15 DIAGNOSIS — I130 Hypertensive heart and chronic kidney disease with heart failure and stage 1 through stage 4 chronic kidney disease, or unspecified chronic kidney disease: Secondary | ICD-10-CM | POA: Diagnosis not present

## 2018-01-15 DIAGNOSIS — I129 Hypertensive chronic kidney disease with stage 1 through stage 4 chronic kidney disease, or unspecified chronic kidney disease: Secondary | ICD-10-CM | POA: Diagnosis not present

## 2018-01-15 DIAGNOSIS — I2510 Atherosclerotic heart disease of native coronary artery without angina pectoris: Secondary | ICD-10-CM

## 2018-01-15 DIAGNOSIS — N179 Acute kidney failure, unspecified: Secondary | ICD-10-CM

## 2018-01-15 DIAGNOSIS — M109 Gout, unspecified: Secondary | ICD-10-CM | POA: Diagnosis not present

## 2018-01-15 DIAGNOSIS — M1990 Unspecified osteoarthritis, unspecified site: Secondary | ICD-10-CM | POA: Diagnosis not present

## 2018-01-15 DIAGNOSIS — E1142 Type 2 diabetes mellitus with diabetic polyneuropathy: Secondary | ICD-10-CM | POA: Diagnosis not present

## 2018-01-15 DIAGNOSIS — Z7982 Long term (current) use of aspirin: Secondary | ICD-10-CM | POA: Diagnosis not present

## 2018-01-15 DIAGNOSIS — Z79899 Other long term (current) drug therapy: Secondary | ICD-10-CM | POA: Diagnosis not present

## 2018-01-15 DIAGNOSIS — E860 Dehydration: Secondary | ICD-10-CM | POA: Diagnosis not present

## 2018-01-15 DIAGNOSIS — Z87891 Personal history of nicotine dependence: Secondary | ICD-10-CM | POA: Diagnosis not present

## 2018-01-15 DIAGNOSIS — J449 Chronic obstructive pulmonary disease, unspecified: Secondary | ICD-10-CM | POA: Diagnosis not present

## 2018-01-15 DIAGNOSIS — I10 Essential (primary) hypertension: Secondary | ICD-10-CM

## 2018-01-15 DIAGNOSIS — R296 Repeated falls: Secondary | ICD-10-CM | POA: Diagnosis not present

## 2018-01-15 DIAGNOSIS — I959 Hypotension, unspecified: Secondary | ICD-10-CM

## 2018-01-15 DIAGNOSIS — Z89511 Acquired absence of right leg below knee: Secondary | ICD-10-CM | POA: Diagnosis not present

## 2018-01-15 DIAGNOSIS — N189 Chronic kidney disease, unspecified: Secondary | ICD-10-CM | POA: Diagnosis not present

## 2018-01-15 DIAGNOSIS — Z794 Long term (current) use of insulin: Secondary | ICD-10-CM | POA: Diagnosis not present

## 2018-01-15 DIAGNOSIS — E1140 Type 2 diabetes mellitus with diabetic neuropathy, unspecified: Secondary | ICD-10-CM

## 2018-01-15 DIAGNOSIS — Z86711 Personal history of pulmonary embolism: Secondary | ICD-10-CM

## 2018-01-15 DIAGNOSIS — R338 Other retention of urine: Secondary | ICD-10-CM | POA: Diagnosis not present

## 2018-01-15 DIAGNOSIS — I249 Acute ischemic heart disease, unspecified: Secondary | ICD-10-CM

## 2018-01-15 DIAGNOSIS — Z951 Presence of aortocoronary bypass graft: Secondary | ICD-10-CM | POA: Diagnosis not present

## 2018-01-15 DIAGNOSIS — N401 Enlarged prostate with lower urinary tract symptoms: Secondary | ICD-10-CM | POA: Diagnosis not present

## 2018-01-15 DIAGNOSIS — I5032 Chronic diastolic (congestive) heart failure: Secondary | ICD-10-CM | POA: Diagnosis not present

## 2018-01-15 DIAGNOSIS — I951 Orthostatic hypotension: Secondary | ICD-10-CM

## 2018-01-15 DIAGNOSIS — Z880 Allergy status to penicillin: Secondary | ICD-10-CM | POA: Diagnosis not present

## 2018-01-15 DIAGNOSIS — Z881 Allergy status to other antibiotic agents status: Secondary | ICD-10-CM | POA: Diagnosis not present

## 2018-01-15 DIAGNOSIS — I252 Old myocardial infarction: Secondary | ICD-10-CM | POA: Diagnosis not present

## 2018-01-15 DIAGNOSIS — R531 Weakness: Secondary | ICD-10-CM | POA: Diagnosis not present

## 2018-01-15 DIAGNOSIS — E785 Hyperlipidemia, unspecified: Secondary | ICD-10-CM

## 2018-01-18 DIAGNOSIS — I5022 Chronic systolic (congestive) heart failure: Secondary | ICD-10-CM | POA: Diagnosis not present

## 2018-01-18 DIAGNOSIS — Z89511 Acquired absence of right leg below knee: Secondary | ICD-10-CM | POA: Diagnosis not present

## 2018-01-18 DIAGNOSIS — I130 Hypertensive heart and chronic kidney disease with heart failure and stage 1 through stage 4 chronic kidney disease, or unspecified chronic kidney disease: Secondary | ICD-10-CM | POA: Diagnosis not present

## 2018-01-18 DIAGNOSIS — Z7901 Long term (current) use of anticoagulants: Secondary | ICD-10-CM | POA: Diagnosis not present

## 2018-01-18 DIAGNOSIS — Z9981 Dependence on supplemental oxygen: Secondary | ICD-10-CM | POA: Diagnosis not present

## 2018-01-18 DIAGNOSIS — E1142 Type 2 diabetes mellitus with diabetic polyneuropathy: Secondary | ICD-10-CM | POA: Diagnosis not present

## 2018-01-18 DIAGNOSIS — D510 Vitamin B12 deficiency anemia due to intrinsic factor deficiency: Secondary | ICD-10-CM | POA: Diagnosis not present

## 2018-01-18 DIAGNOSIS — Z794 Long term (current) use of insulin: Secondary | ICD-10-CM | POA: Diagnosis not present

## 2018-01-18 DIAGNOSIS — E1122 Type 2 diabetes mellitus with diabetic chronic kidney disease: Secondary | ICD-10-CM | POA: Diagnosis not present

## 2018-01-18 DIAGNOSIS — I951 Orthostatic hypotension: Secondary | ICD-10-CM | POA: Diagnosis not present

## 2018-01-18 DIAGNOSIS — Z951 Presence of aortocoronary bypass graft: Secondary | ICD-10-CM | POA: Diagnosis not present

## 2018-01-18 DIAGNOSIS — Z87891 Personal history of nicotine dependence: Secondary | ICD-10-CM | POA: Diagnosis not present

## 2018-01-18 DIAGNOSIS — Z7982 Long term (current) use of aspirin: Secondary | ICD-10-CM | POA: Diagnosis not present

## 2018-01-18 DIAGNOSIS — I2510 Atherosclerotic heart disease of native coronary artery without angina pectoris: Secondary | ICD-10-CM | POA: Diagnosis not present

## 2018-01-18 DIAGNOSIS — N183 Chronic kidney disease, stage 3 (moderate): Secondary | ICD-10-CM | POA: Diagnosis not present

## 2018-01-18 DIAGNOSIS — Z86711 Personal history of pulmonary embolism: Secondary | ICD-10-CM | POA: Diagnosis not present

## 2018-01-18 DIAGNOSIS — E1151 Type 2 diabetes mellitus with diabetic peripheral angiopathy without gangrene: Secondary | ICD-10-CM | POA: Diagnosis not present

## 2018-01-20 DIAGNOSIS — Z7982 Long term (current) use of aspirin: Secondary | ICD-10-CM | POA: Diagnosis not present

## 2018-01-20 DIAGNOSIS — E1151 Type 2 diabetes mellitus with diabetic peripheral angiopathy without gangrene: Secondary | ICD-10-CM | POA: Diagnosis not present

## 2018-01-20 DIAGNOSIS — I2510 Atherosclerotic heart disease of native coronary artery without angina pectoris: Secondary | ICD-10-CM | POA: Diagnosis not present

## 2018-01-20 DIAGNOSIS — E1122 Type 2 diabetes mellitus with diabetic chronic kidney disease: Secondary | ICD-10-CM | POA: Diagnosis not present

## 2018-01-20 DIAGNOSIS — Z951 Presence of aortocoronary bypass graft: Secondary | ICD-10-CM | POA: Diagnosis not present

## 2018-01-20 DIAGNOSIS — D510 Vitamin B12 deficiency anemia due to intrinsic factor deficiency: Secondary | ICD-10-CM | POA: Diagnosis not present

## 2018-01-20 DIAGNOSIS — Z86711 Personal history of pulmonary embolism: Secondary | ICD-10-CM | POA: Diagnosis not present

## 2018-01-20 DIAGNOSIS — Z794 Long term (current) use of insulin: Secondary | ICD-10-CM | POA: Diagnosis not present

## 2018-01-20 DIAGNOSIS — I5022 Chronic systolic (congestive) heart failure: Secondary | ICD-10-CM | POA: Diagnosis not present

## 2018-01-20 DIAGNOSIS — N183 Chronic kidney disease, stage 3 (moderate): Secondary | ICD-10-CM | POA: Diagnosis not present

## 2018-01-20 DIAGNOSIS — Z87891 Personal history of nicotine dependence: Secondary | ICD-10-CM | POA: Diagnosis not present

## 2018-01-20 DIAGNOSIS — Z89511 Acquired absence of right leg below knee: Secondary | ICD-10-CM | POA: Diagnosis not present

## 2018-01-20 DIAGNOSIS — I951 Orthostatic hypotension: Secondary | ICD-10-CM | POA: Diagnosis not present

## 2018-01-20 DIAGNOSIS — I130 Hypertensive heart and chronic kidney disease with heart failure and stage 1 through stage 4 chronic kidney disease, or unspecified chronic kidney disease: Secondary | ICD-10-CM | POA: Diagnosis not present

## 2018-01-20 DIAGNOSIS — Z9981 Dependence on supplemental oxygen: Secondary | ICD-10-CM | POA: Diagnosis not present

## 2018-01-20 DIAGNOSIS — E1142 Type 2 diabetes mellitus with diabetic polyneuropathy: Secondary | ICD-10-CM | POA: Diagnosis not present

## 2018-01-20 DIAGNOSIS — Z7901 Long term (current) use of anticoagulants: Secondary | ICD-10-CM | POA: Diagnosis not present

## 2018-01-21 DIAGNOSIS — Z86711 Personal history of pulmonary embolism: Secondary | ICD-10-CM | POA: Diagnosis not present

## 2018-01-21 DIAGNOSIS — I2510 Atherosclerotic heart disease of native coronary artery without angina pectoris: Secondary | ICD-10-CM | POA: Diagnosis not present

## 2018-01-21 DIAGNOSIS — Z951 Presence of aortocoronary bypass graft: Secondary | ICD-10-CM | POA: Diagnosis not present

## 2018-01-21 DIAGNOSIS — I951 Orthostatic hypotension: Secondary | ICD-10-CM | POA: Diagnosis not present

## 2018-01-21 DIAGNOSIS — Z9981 Dependence on supplemental oxygen: Secondary | ICD-10-CM | POA: Diagnosis not present

## 2018-01-21 DIAGNOSIS — Z7982 Long term (current) use of aspirin: Secondary | ICD-10-CM | POA: Diagnosis not present

## 2018-01-21 DIAGNOSIS — E1122 Type 2 diabetes mellitus with diabetic chronic kidney disease: Secondary | ICD-10-CM | POA: Diagnosis not present

## 2018-01-21 DIAGNOSIS — E1151 Type 2 diabetes mellitus with diabetic peripheral angiopathy without gangrene: Secondary | ICD-10-CM | POA: Diagnosis not present

## 2018-01-21 DIAGNOSIS — D510 Vitamin B12 deficiency anemia due to intrinsic factor deficiency: Secondary | ICD-10-CM | POA: Diagnosis not present

## 2018-01-21 DIAGNOSIS — Z89511 Acquired absence of right leg below knee: Secondary | ICD-10-CM | POA: Diagnosis not present

## 2018-01-21 DIAGNOSIS — Z7901 Long term (current) use of anticoagulants: Secondary | ICD-10-CM | POA: Diagnosis not present

## 2018-01-21 DIAGNOSIS — N183 Chronic kidney disease, stage 3 (moderate): Secondary | ICD-10-CM | POA: Diagnosis not present

## 2018-01-21 DIAGNOSIS — Z794 Long term (current) use of insulin: Secondary | ICD-10-CM | POA: Diagnosis not present

## 2018-01-21 DIAGNOSIS — I130 Hypertensive heart and chronic kidney disease with heart failure and stage 1 through stage 4 chronic kidney disease, or unspecified chronic kidney disease: Secondary | ICD-10-CM | POA: Diagnosis not present

## 2018-01-21 DIAGNOSIS — Z87891 Personal history of nicotine dependence: Secondary | ICD-10-CM | POA: Diagnosis not present

## 2018-01-21 DIAGNOSIS — I5022 Chronic systolic (congestive) heart failure: Secondary | ICD-10-CM | POA: Diagnosis not present

## 2018-01-21 DIAGNOSIS — E1142 Type 2 diabetes mellitus with diabetic polyneuropathy: Secondary | ICD-10-CM | POA: Diagnosis not present

## 2018-01-22 DIAGNOSIS — N183 Chronic kidney disease, stage 3 (moderate): Secondary | ICD-10-CM | POA: Diagnosis not present

## 2018-01-22 DIAGNOSIS — I9589 Other hypotension: Secondary | ICD-10-CM | POA: Diagnosis not present

## 2018-01-22 DIAGNOSIS — Z794 Long term (current) use of insulin: Secondary | ICD-10-CM | POA: Diagnosis not present

## 2018-01-22 DIAGNOSIS — E1122 Type 2 diabetes mellitus with diabetic chronic kidney disease: Secondary | ICD-10-CM | POA: Diagnosis not present

## 2018-01-22 DIAGNOSIS — Z7189 Other specified counseling: Secondary | ICD-10-CM | POA: Diagnosis not present

## 2018-01-23 DIAGNOSIS — Z86711 Personal history of pulmonary embolism: Secondary | ICD-10-CM | POA: Diagnosis not present

## 2018-01-23 DIAGNOSIS — Z7982 Long term (current) use of aspirin: Secondary | ICD-10-CM | POA: Diagnosis not present

## 2018-01-23 DIAGNOSIS — Z7901 Long term (current) use of anticoagulants: Secondary | ICD-10-CM | POA: Diagnosis not present

## 2018-01-23 DIAGNOSIS — I951 Orthostatic hypotension: Secondary | ICD-10-CM | POA: Diagnosis not present

## 2018-01-23 DIAGNOSIS — Z9981 Dependence on supplemental oxygen: Secondary | ICD-10-CM | POA: Diagnosis not present

## 2018-01-23 DIAGNOSIS — E1122 Type 2 diabetes mellitus with diabetic chronic kidney disease: Secondary | ICD-10-CM | POA: Diagnosis not present

## 2018-01-23 DIAGNOSIS — E1142 Type 2 diabetes mellitus with diabetic polyneuropathy: Secondary | ICD-10-CM | POA: Diagnosis not present

## 2018-01-23 DIAGNOSIS — I2510 Atherosclerotic heart disease of native coronary artery without angina pectoris: Secondary | ICD-10-CM | POA: Diagnosis not present

## 2018-01-23 DIAGNOSIS — Z89511 Acquired absence of right leg below knee: Secondary | ICD-10-CM | POA: Diagnosis not present

## 2018-01-23 DIAGNOSIS — D510 Vitamin B12 deficiency anemia due to intrinsic factor deficiency: Secondary | ICD-10-CM | POA: Diagnosis not present

## 2018-01-23 DIAGNOSIS — I5022 Chronic systolic (congestive) heart failure: Secondary | ICD-10-CM | POA: Diagnosis not present

## 2018-01-23 DIAGNOSIS — Z951 Presence of aortocoronary bypass graft: Secondary | ICD-10-CM | POA: Diagnosis not present

## 2018-01-23 DIAGNOSIS — E1151 Type 2 diabetes mellitus with diabetic peripheral angiopathy without gangrene: Secondary | ICD-10-CM | POA: Diagnosis not present

## 2018-01-23 DIAGNOSIS — N183 Chronic kidney disease, stage 3 (moderate): Secondary | ICD-10-CM | POA: Diagnosis not present

## 2018-01-23 DIAGNOSIS — Z87891 Personal history of nicotine dependence: Secondary | ICD-10-CM | POA: Diagnosis not present

## 2018-01-23 DIAGNOSIS — I130 Hypertensive heart and chronic kidney disease with heart failure and stage 1 through stage 4 chronic kidney disease, or unspecified chronic kidney disease: Secondary | ICD-10-CM | POA: Diagnosis not present

## 2018-01-23 DIAGNOSIS — Z794 Long term (current) use of insulin: Secondary | ICD-10-CM | POA: Diagnosis not present

## 2018-01-27 DIAGNOSIS — E1151 Type 2 diabetes mellitus with diabetic peripheral angiopathy without gangrene: Secondary | ICD-10-CM | POA: Diagnosis not present

## 2018-01-27 DIAGNOSIS — Z9981 Dependence on supplemental oxygen: Secondary | ICD-10-CM | POA: Diagnosis not present

## 2018-01-27 DIAGNOSIS — Z89511 Acquired absence of right leg below knee: Secondary | ICD-10-CM | POA: Diagnosis not present

## 2018-01-27 DIAGNOSIS — I130 Hypertensive heart and chronic kidney disease with heart failure and stage 1 through stage 4 chronic kidney disease, or unspecified chronic kidney disease: Secondary | ICD-10-CM | POA: Diagnosis not present

## 2018-01-27 DIAGNOSIS — E1122 Type 2 diabetes mellitus with diabetic chronic kidney disease: Secondary | ICD-10-CM | POA: Diagnosis not present

## 2018-01-27 DIAGNOSIS — Z86711 Personal history of pulmonary embolism: Secondary | ICD-10-CM | POA: Diagnosis not present

## 2018-01-27 DIAGNOSIS — Z794 Long term (current) use of insulin: Secondary | ICD-10-CM | POA: Diagnosis not present

## 2018-01-27 DIAGNOSIS — Z87891 Personal history of nicotine dependence: Secondary | ICD-10-CM | POA: Diagnosis not present

## 2018-01-27 DIAGNOSIS — E1142 Type 2 diabetes mellitus with diabetic polyneuropathy: Secondary | ICD-10-CM | POA: Diagnosis not present

## 2018-01-27 DIAGNOSIS — Z7901 Long term (current) use of anticoagulants: Secondary | ICD-10-CM | POA: Diagnosis not present

## 2018-01-27 DIAGNOSIS — Z951 Presence of aortocoronary bypass graft: Secondary | ICD-10-CM | POA: Diagnosis not present

## 2018-01-27 DIAGNOSIS — D510 Vitamin B12 deficiency anemia due to intrinsic factor deficiency: Secondary | ICD-10-CM | POA: Diagnosis not present

## 2018-01-27 DIAGNOSIS — N183 Chronic kidney disease, stage 3 (moderate): Secondary | ICD-10-CM | POA: Diagnosis not present

## 2018-01-27 DIAGNOSIS — Z7982 Long term (current) use of aspirin: Secondary | ICD-10-CM | POA: Diagnosis not present

## 2018-01-27 DIAGNOSIS — I2510 Atherosclerotic heart disease of native coronary artery without angina pectoris: Secondary | ICD-10-CM | POA: Diagnosis not present

## 2018-01-27 DIAGNOSIS — I5022 Chronic systolic (congestive) heart failure: Secondary | ICD-10-CM | POA: Diagnosis not present

## 2018-01-27 DIAGNOSIS — I951 Orthostatic hypotension: Secondary | ICD-10-CM | POA: Diagnosis not present

## 2018-01-29 DIAGNOSIS — Z7901 Long term (current) use of anticoagulants: Secondary | ICD-10-CM | POA: Diagnosis not present

## 2018-01-29 DIAGNOSIS — N183 Chronic kidney disease, stage 3 (moderate): Secondary | ICD-10-CM | POA: Diagnosis not present

## 2018-01-29 DIAGNOSIS — Z794 Long term (current) use of insulin: Secondary | ICD-10-CM | POA: Diagnosis not present

## 2018-01-29 DIAGNOSIS — D510 Vitamin B12 deficiency anemia due to intrinsic factor deficiency: Secondary | ICD-10-CM | POA: Diagnosis not present

## 2018-01-29 DIAGNOSIS — Z89511 Acquired absence of right leg below knee: Secondary | ICD-10-CM | POA: Diagnosis not present

## 2018-01-29 DIAGNOSIS — Z86711 Personal history of pulmonary embolism: Secondary | ICD-10-CM | POA: Diagnosis not present

## 2018-01-29 DIAGNOSIS — Z7982 Long term (current) use of aspirin: Secondary | ICD-10-CM | POA: Diagnosis not present

## 2018-01-29 DIAGNOSIS — I130 Hypertensive heart and chronic kidney disease with heart failure and stage 1 through stage 4 chronic kidney disease, or unspecified chronic kidney disease: Secondary | ICD-10-CM | POA: Diagnosis not present

## 2018-01-29 DIAGNOSIS — I951 Orthostatic hypotension: Secondary | ICD-10-CM | POA: Diagnosis not present

## 2018-01-29 DIAGNOSIS — I2510 Atherosclerotic heart disease of native coronary artery without angina pectoris: Secondary | ICD-10-CM | POA: Diagnosis not present

## 2018-01-29 DIAGNOSIS — E1151 Type 2 diabetes mellitus with diabetic peripheral angiopathy without gangrene: Secondary | ICD-10-CM | POA: Diagnosis not present

## 2018-01-29 DIAGNOSIS — Z9981 Dependence on supplemental oxygen: Secondary | ICD-10-CM | POA: Diagnosis not present

## 2018-01-29 DIAGNOSIS — E1122 Type 2 diabetes mellitus with diabetic chronic kidney disease: Secondary | ICD-10-CM | POA: Diagnosis not present

## 2018-01-29 DIAGNOSIS — Z951 Presence of aortocoronary bypass graft: Secondary | ICD-10-CM | POA: Diagnosis not present

## 2018-01-29 DIAGNOSIS — Z87891 Personal history of nicotine dependence: Secondary | ICD-10-CM | POA: Diagnosis not present

## 2018-01-29 DIAGNOSIS — I5022 Chronic systolic (congestive) heart failure: Secondary | ICD-10-CM | POA: Diagnosis not present

## 2018-01-29 DIAGNOSIS — E1142 Type 2 diabetes mellitus with diabetic polyneuropathy: Secondary | ICD-10-CM | POA: Diagnosis not present

## 2018-01-31 DIAGNOSIS — I2782 Chronic pulmonary embolism: Secondary | ICD-10-CM | POA: Diagnosis not present

## 2018-02-04 DIAGNOSIS — E1122 Type 2 diabetes mellitus with diabetic chronic kidney disease: Secondary | ICD-10-CM | POA: Diagnosis not present

## 2018-02-04 DIAGNOSIS — I951 Orthostatic hypotension: Secondary | ICD-10-CM | POA: Diagnosis not present

## 2018-02-04 DIAGNOSIS — Z86711 Personal history of pulmonary embolism: Secondary | ICD-10-CM | POA: Diagnosis not present

## 2018-02-04 DIAGNOSIS — I2510 Atherosclerotic heart disease of native coronary artery without angina pectoris: Secondary | ICD-10-CM | POA: Diagnosis not present

## 2018-02-04 DIAGNOSIS — N183 Chronic kidney disease, stage 3 (moderate): Secondary | ICD-10-CM | POA: Diagnosis not present

## 2018-02-04 DIAGNOSIS — Z951 Presence of aortocoronary bypass graft: Secondary | ICD-10-CM | POA: Diagnosis not present

## 2018-02-04 DIAGNOSIS — E1151 Type 2 diabetes mellitus with diabetic peripheral angiopathy without gangrene: Secondary | ICD-10-CM | POA: Diagnosis not present

## 2018-02-04 DIAGNOSIS — I130 Hypertensive heart and chronic kidney disease with heart failure and stage 1 through stage 4 chronic kidney disease, or unspecified chronic kidney disease: Secondary | ICD-10-CM | POA: Diagnosis not present

## 2018-02-04 DIAGNOSIS — Z89511 Acquired absence of right leg below knee: Secondary | ICD-10-CM | POA: Diagnosis not present

## 2018-02-04 DIAGNOSIS — Z9981 Dependence on supplemental oxygen: Secondary | ICD-10-CM | POA: Diagnosis not present

## 2018-02-04 DIAGNOSIS — E1142 Type 2 diabetes mellitus with diabetic polyneuropathy: Secondary | ICD-10-CM | POA: Diagnosis not present

## 2018-02-04 DIAGNOSIS — D510 Vitamin B12 deficiency anemia due to intrinsic factor deficiency: Secondary | ICD-10-CM | POA: Diagnosis not present

## 2018-02-04 DIAGNOSIS — Z7901 Long term (current) use of anticoagulants: Secondary | ICD-10-CM | POA: Diagnosis not present

## 2018-02-04 DIAGNOSIS — I5022 Chronic systolic (congestive) heart failure: Secondary | ICD-10-CM | POA: Diagnosis not present

## 2018-02-04 DIAGNOSIS — Z7982 Long term (current) use of aspirin: Secondary | ICD-10-CM | POA: Diagnosis not present

## 2018-02-04 DIAGNOSIS — Z87891 Personal history of nicotine dependence: Secondary | ICD-10-CM | POA: Diagnosis not present

## 2018-02-04 DIAGNOSIS — Z794 Long term (current) use of insulin: Secondary | ICD-10-CM | POA: Diagnosis not present

## 2018-02-10 DIAGNOSIS — D510 Vitamin B12 deficiency anemia due to intrinsic factor deficiency: Secondary | ICD-10-CM | POA: Diagnosis not present

## 2018-02-10 DIAGNOSIS — N183 Chronic kidney disease, stage 3 (moderate): Secondary | ICD-10-CM | POA: Diagnosis not present

## 2018-02-10 DIAGNOSIS — E1151 Type 2 diabetes mellitus with diabetic peripheral angiopathy without gangrene: Secondary | ICD-10-CM | POA: Diagnosis not present

## 2018-02-10 DIAGNOSIS — Z9981 Dependence on supplemental oxygen: Secondary | ICD-10-CM | POA: Diagnosis not present

## 2018-02-10 DIAGNOSIS — Z87891 Personal history of nicotine dependence: Secondary | ICD-10-CM | POA: Diagnosis not present

## 2018-02-10 DIAGNOSIS — Z794 Long term (current) use of insulin: Secondary | ICD-10-CM | POA: Diagnosis not present

## 2018-02-10 DIAGNOSIS — Z86711 Personal history of pulmonary embolism: Secondary | ICD-10-CM | POA: Diagnosis not present

## 2018-02-10 DIAGNOSIS — E1142 Type 2 diabetes mellitus with diabetic polyneuropathy: Secondary | ICD-10-CM | POA: Diagnosis not present

## 2018-02-10 DIAGNOSIS — Z89511 Acquired absence of right leg below knee: Secondary | ICD-10-CM | POA: Diagnosis not present

## 2018-02-10 DIAGNOSIS — I130 Hypertensive heart and chronic kidney disease with heart failure and stage 1 through stage 4 chronic kidney disease, or unspecified chronic kidney disease: Secondary | ICD-10-CM | POA: Diagnosis not present

## 2018-02-10 DIAGNOSIS — I951 Orthostatic hypotension: Secondary | ICD-10-CM | POA: Diagnosis not present

## 2018-02-10 DIAGNOSIS — Z7982 Long term (current) use of aspirin: Secondary | ICD-10-CM | POA: Diagnosis not present

## 2018-02-10 DIAGNOSIS — E1122 Type 2 diabetes mellitus with diabetic chronic kidney disease: Secondary | ICD-10-CM | POA: Diagnosis not present

## 2018-02-10 DIAGNOSIS — I2510 Atherosclerotic heart disease of native coronary artery without angina pectoris: Secondary | ICD-10-CM | POA: Diagnosis not present

## 2018-02-10 DIAGNOSIS — Z951 Presence of aortocoronary bypass graft: Secondary | ICD-10-CM | POA: Diagnosis not present

## 2018-02-10 DIAGNOSIS — I5022 Chronic systolic (congestive) heart failure: Secondary | ICD-10-CM | POA: Diagnosis not present

## 2018-02-10 DIAGNOSIS — Z7901 Long term (current) use of anticoagulants: Secondary | ICD-10-CM | POA: Diagnosis not present

## 2018-02-19 DIAGNOSIS — I951 Orthostatic hypotension: Secondary | ICD-10-CM | POA: Diagnosis not present

## 2018-03-02 DIAGNOSIS — I2782 Chronic pulmonary embolism: Secondary | ICD-10-CM | POA: Diagnosis not present

## 2018-03-03 DIAGNOSIS — I951 Orthostatic hypotension: Secondary | ICD-10-CM | POA: Diagnosis not present

## 2018-03-13 DIAGNOSIS — N183 Chronic kidney disease, stage 3 (moderate): Secondary | ICD-10-CM | POA: Diagnosis not present

## 2018-03-13 DIAGNOSIS — Z794 Long term (current) use of insulin: Secondary | ICD-10-CM | POA: Diagnosis not present

## 2018-03-13 DIAGNOSIS — E1122 Type 2 diabetes mellitus with diabetic chronic kidney disease: Secondary | ICD-10-CM | POA: Diagnosis not present

## 2018-03-13 DIAGNOSIS — I5022 Chronic systolic (congestive) heart failure: Secondary | ICD-10-CM | POA: Diagnosis not present

## 2018-03-13 DIAGNOSIS — I110 Hypertensive heart disease with heart failure: Secondary | ICD-10-CM | POA: Diagnosis not present

## 2018-03-21 ENCOUNTER — Other Ambulatory Visit: Payer: Self-pay

## 2018-03-27 ENCOUNTER — Other Ambulatory Visit: Payer: Self-pay | Admitting: *Deleted

## 2018-03-27 NOTE — Patient Outreach (Addendum)
[facility]) Care Management  03/27/2018  Eduardo Stewart 09-Jun-1966 [id number]   EMMI prevent engagement  Referral Date: 03/21/18  Referral Source: EMMI referral prevent Referral Reason: score 13  Insurance: united healthcare medicare   Outreach attempt #1 successful  Patient is able to verify HIPAA Reviewed and addressed referral to [facility] with patient [name] CHF program Eduardo Stewart report he was active with CHF program and has U[employer] scales but can not use it to the best of his benefit related to only having one extremity Scale values vary and he is not able to stand appropriately on one leg on the scale   Falls x 2 but report they are mechanical related to his amputation He denies injuries with the falls ED visits x 4 Last on 01/14/18 with admissions x 3 last 01/15/18 for low bp Social: Eduardo Stewart lives with his wife and son.  He reports his wife is a wonderful support and very knowledgeable about his chronic illnesses    Conditions: PVD, CAD, CHF, DM2, Kidney Failure, BKA , hypotension, GERD, Major depressive disorder,  He is aware of his A1c of 9.1  2 months ago and am cbg average from 79-140   Medications: denies concerns with taking medications as prescribed, affording medications, side effects of medications and questions about medications  Appointments: He reports seeing [name] 2 weeks ago  Advance Directives: He does not have a living will or POA and is very interested in getting them  Consent: [facility] RN CM reviewed [facility] services with patient. Patient gave verbal consent for services for [facility] and SW   Plan: [facility] RN CM will refer Eduardo Stewart to [facility] SW for assist with advance directives and pending refer to [place] coach for CHF, DM services if meet criteria [facility] RN CM sent a successful outreach letter as discussed with Eduardo Stewart [name], RN, BSN, [place] Management Care Coordinator Direct Number [phone] Mobile number [phone]   Main [facility] number [phone] Fax number [phone]

## 2018-04-01 ENCOUNTER — Other Ambulatory Visit: Payer: Self-pay

## 2018-04-01 NOTE — Patient Outreach (Signed)
[facility]) Care Management  04/01/2018  Eduardo Stewart 07/13/1966 [id number]   Initial outreach to patient regarding social work referral for assistance with Advance Directives.  BSW left voicemail message.  Will attempt to contact next week.  Unsuccessful outreach letter mailed.   Ronn Melena, BSW Social Worker [phone]

## 2018-04-02 DIAGNOSIS — I2782 Chronic pulmonary embolism: Secondary | ICD-10-CM | POA: Diagnosis not present

## 2018-04-02 NOTE — Addendum Note (Signed)
Addended by: Barbaraann Faster on: 04/02/2018 10:15 AM   Modules accepted: Orders

## 2018-04-02 NOTE — Patient Outreach (Signed)
[facility]) Care Management  04/02/2018  TAMON PARKERSON 01/05/1966 [id number]   Care coordination   [facility] RN CM consulted with [place] coach about referral eligibility   Plan [facility] RN CM referred Mr Delorenzo to [facility] health coach as discussed with him on 03/27/18 for further support, education and management of DM, CHF   Kimberly [name], RN, BSN, [place] Coordinator Office number [phone] Mobile number [phone]  Main [facility] number [phone] Fax number [phone]

## 2018-04-04 ENCOUNTER — Encounter: Payer: Self-pay | Admitting: *Deleted

## 2018-04-08 ENCOUNTER — Ambulatory Visit: Payer: Self-pay

## 2018-04-08 ENCOUNTER — Other Ambulatory Visit: Payer: Self-pay

## 2018-04-08 NOTE — Patient Outreach (Signed)
[facility]) Care Management  04/08/2018  CRISPIN VOGEL October 12, 1965 [id number]   Second outreach attempt to patient regarding social work referral for assistance with Advance Directives.  BSW left voicemail message.  Will attempt to contact again within four business days.   Unsuccessful outreach letter mailed on 04/01/18.   Ronn Melena, BSW Social Worker [phone]

## 2018-04-11 ENCOUNTER — Ambulatory Visit: Payer: Self-pay

## 2018-04-11 ENCOUNTER — Other Ambulatory Visit: Payer: Self-pay

## 2018-04-11 NOTE — Patient Outreach (Signed)
[place] [facility]) Care Management  04/11/2018  Eduardo Stewart 06-Sep-1965 [id number]   Final outreach attempt to patient regarding social work referral for assistance with Advance Directives. BSW left voicemail message. Will close next week if no response.  Ronn Melena, BSW Social Worker [phone]

## 2018-04-15 ENCOUNTER — Other Ambulatory Visit: Payer: Self-pay

## 2018-04-15 NOTE — Patient Outreach (Signed)
[facility]) Care Management  04/15/2018  Eduardo Stewart 14-May-1966 [id number]   [facility] BSW closing case due to inability to contact.    Ronn Melena, BSW Social Worker [phone]

## 2018-04-16 ENCOUNTER — Other Ambulatory Visit: Payer: Self-pay | Admitting: *Deleted

## 2018-04-16 NOTE — Patient Outreach (Signed)
[facility]) Care Management  04/16/2018  BRYSIN TOWERY 05/22/1966 [id number]   RN Health Coach Introduction Call  Referral Date:  04/02/2018 Referral Source:  EMMI Prevent Screening Reason for Referral:  Disease Management Education Insurance:  [employer]   Outreach Attempt: Outreach attempt #1 to patient for introductory call.  Patient's son answered and stated patient in the shower, requested RN Health Coach speak with patient's wife.  Spoke with wife and updated her [place] would need permission from patient to speak with her concerning medical conditions.  Wife stated patient unavailable and they would be leaving to go out of town for a week.  Received return call back from patient.  HIPAA verified with patient.  RN Health Coach introduced self and role.  Patient verbally agrees to Disease Management telephone outreaches.  Does endorse he will be out of town visiting family for the next few weeks.  Requesting call back later in the month.   Plan:  RN Health Coach will attempt outreach to complete initial telephone assessment the end of September.   [place] [phone] [email or web address] [email or web address]

## 2018-05-02 ENCOUNTER — Other Ambulatory Visit: Payer: Self-pay | Admitting: *Deleted

## 2018-05-02 NOTE — Patient Outreach (Signed)
[facility]) Care Management  05/02/2018  GHALI MORISSETTE April 27, 1966 [id number]   RN Health Coach Initial Assessment  Referral Date:  04/02/2018 Referral Source:  EMMI Prevent Screening Reason for Referral:  Disease Management Education Insurance:  [employer]   Outreach Attempt:   Outreach attempt #1 to patient for initial telephone assessment.  Patient answered and stated he was on his way to a doctor's appointment.  States he is unable to complete initial telephone assessment today.   Plan:  RN Health Coach will attempt another outreach to complete initial telephone assessment within the month of October.   [place] [phone] [email or web address] [email or web address]

## 2018-05-03 DIAGNOSIS — I2782 Chronic pulmonary embolism: Secondary | ICD-10-CM | POA: Diagnosis not present

## 2018-05-22 ENCOUNTER — Other Ambulatory Visit: Payer: Self-pay | Admitting: *Deleted

## 2018-05-22 DIAGNOSIS — R55 Syncope and collapse: Secondary | ICD-10-CM | POA: Diagnosis not present

## 2018-05-22 NOTE — Patient Outreach (Signed)
[facility] Care Management  05/22/2018  Eduardo Stewart [birth year] [id number]   RN Health Coach Initial Assessment  Referral Date:04/02/2018 Referral Source:EMMI Prevent Screening Reason for Referral:Disease Management Education Insurance:[employer]   Outreach Attempt:  Outreach attempt #2 to patient for initial telephone assessment. No answer. RN Health Coach left HIPAA compliant voicemail message along with contact information.  Plan:  RN Health Coach will send Unsuccessful Letter to patient.  RN Health Coach will attempt final outreach to complete initial telephone assessment within the month of November.  [place] [phone] [email or web address]

## 2018-05-23 DIAGNOSIS — R55 Syncope and collapse: Secondary | ICD-10-CM | POA: Diagnosis not present

## 2018-06-23 ENCOUNTER — Other Ambulatory Visit: Payer: Self-pay | Admitting: *Deleted

## 2018-06-23 NOTE — Patient Outreach (Signed)
[place] [facility]) Care Management  06/23/2018  Eduardo Stewart 1965/09/09 [id number]   RN Health Coach Initial Assessment  Referral Date:04/02/2018 Referral Source:EMMI Prevent Screening Reason for Referral:Disease Management Education Insurance:[employer]   Outreach Attempt:  Outreach attempt #3 to patient for initial telephone assessment.  Patient answered and stated he was just walking out the door.  Unable to complete assessment today.  RN Health Coach requested patient return call at his convenience.  Plan:  RN Health Coach will send unsuccessful outreach letter to patient. RN Health Coach will make final outreach attempt to patient within 10 business days if no return call back from patient.  [place] [phone] [email or web address] [email or web address]

## 2018-07-07 ENCOUNTER — Other Ambulatory Visit: Payer: Self-pay | Admitting: *Deleted

## 2018-07-07 NOTE — Patient Outreach (Signed)
[facility]) Care Management  07/07/2018  ANGELOS WASCO 08/04/1966 [id number]   RN Health Coach Initial Assessment  Referral Date:04/02/2018 Referral Source:EMMI Prevent Screening Reason for Referral:Disease Management Education Insurance:[employer]   Outreach Attempt:  Outreach attempt #4 to patient for initial telephone assessment.  Patient answered.  When requested to verify HIPAA, telephone hung up.  RN Health Coach waited 10 minutes for return call from patient, no return call.  Plan:  RN Health Coach will close case based on inability to establish contact with patient to complete initial telephone assessment.  [place] [phone] Ardine [email or web address]

## 2018-12-02 DIAGNOSIS — S88119A Complete traumatic amputation at level between knee and ankle, unspecified lower leg, initial encounter: Secondary | ICD-10-CM | POA: Diagnosis not present

## 2018-12-02 DIAGNOSIS — L03119 Cellulitis of unspecified part of limb: Secondary | ICD-10-CM | POA: Diagnosis not present

## 2018-12-02 DIAGNOSIS — I2782 Chronic pulmonary embolism: Secondary | ICD-10-CM | POA: Diagnosis not present

## 2018-12-02 DIAGNOSIS — A412 Sepsis due to unspecified staphylococcus: Secondary | ICD-10-CM | POA: Diagnosis not present

## 2018-12-18 DIAGNOSIS — N183 Chronic kidney disease, stage 3 (moderate): Secondary | ICD-10-CM | POA: Diagnosis not present

## 2018-12-18 DIAGNOSIS — I959 Hypotension, unspecified: Secondary | ICD-10-CM | POA: Diagnosis not present

## 2018-12-18 DIAGNOSIS — E1122 Type 2 diabetes mellitus with diabetic chronic kidney disease: Secondary | ICD-10-CM | POA: Diagnosis not present

## 2018-12-18 DIAGNOSIS — Z794 Long term (current) use of insulin: Secondary | ICD-10-CM | POA: Diagnosis not present

## 2018-12-18 DIAGNOSIS — E782 Mixed hyperlipidemia: Secondary | ICD-10-CM | POA: Diagnosis not present

## 2018-12-18 DIAGNOSIS — Z79899 Other long term (current) drug therapy: Secondary | ICD-10-CM | POA: Diagnosis not present

## 2019-01-01 DIAGNOSIS — I2782 Chronic pulmonary embolism: Secondary | ICD-10-CM | POA: Diagnosis not present

## 2019-01-01 DIAGNOSIS — A412 Sepsis due to unspecified staphylococcus: Secondary | ICD-10-CM | POA: Diagnosis not present

## 2019-01-01 DIAGNOSIS — S88119A Complete traumatic amputation at level between knee and ankle, unspecified lower leg, initial encounter: Secondary | ICD-10-CM | POA: Diagnosis not present

## 2019-01-01 DIAGNOSIS — G9389 Other specified disorders of brain: Secondary | ICD-10-CM | POA: Diagnosis not present

## 2019-01-01 DIAGNOSIS — L03119 Cellulitis of unspecified part of limb: Secondary | ICD-10-CM | POA: Diagnosis not present

## 2019-01-01 DIAGNOSIS — R569 Unspecified convulsions: Secondary | ICD-10-CM | POA: Diagnosis not present

## 2019-01-08 DIAGNOSIS — L98499 Non-pressure chronic ulcer of skin of other sites with unspecified severity: Secondary | ICD-10-CM | POA: Diagnosis not present

## 2019-01-08 DIAGNOSIS — K6289 Other specified diseases of anus and rectum: Secondary | ICD-10-CM | POA: Diagnosis not present

## 2019-01-08 DIAGNOSIS — E1122 Type 2 diabetes mellitus with diabetic chronic kidney disease: Secondary | ICD-10-CM | POA: Diagnosis not present

## 2019-01-08 DIAGNOSIS — I959 Hypotension, unspecified: Secondary | ICD-10-CM | POA: Diagnosis not present

## 2019-01-29 DIAGNOSIS — I2510 Atherosclerotic heart disease of native coronary artery without angina pectoris: Secondary | ICD-10-CM | POA: Diagnosis not present

## 2019-01-29 DIAGNOSIS — I959 Hypotension, unspecified: Secondary | ICD-10-CM | POA: Diagnosis not present

## 2019-01-29 DIAGNOSIS — R55 Syncope and collapse: Secondary | ICD-10-CM | POA: Diagnosis not present

## 2019-01-29 DIAGNOSIS — S299XXA Unspecified injury of thorax, initial encounter: Secondary | ICD-10-CM | POA: Diagnosis not present

## 2019-02-01 DIAGNOSIS — L03119 Cellulitis of unspecified part of limb: Secondary | ICD-10-CM | POA: Diagnosis not present

## 2019-02-01 DIAGNOSIS — I2782 Chronic pulmonary embolism: Secondary | ICD-10-CM | POA: Diagnosis not present

## 2019-02-01 DIAGNOSIS — S88119A Complete traumatic amputation at level between knee and ankle, unspecified lower leg, initial encounter: Secondary | ICD-10-CM | POA: Diagnosis not present

## 2019-02-01 DIAGNOSIS — A412 Sepsis due to unspecified staphylococcus: Secondary | ICD-10-CM | POA: Diagnosis not present

## 2019-04-27 DIAGNOSIS — N179 Acute kidney failure, unspecified: Secondary | ICD-10-CM

## 2019-04-27 DIAGNOSIS — I959 Hypotension, unspecified: Secondary | ICD-10-CM

## 2019-04-27 DIAGNOSIS — E43 Unspecified severe protein-calorie malnutrition: Secondary | ICD-10-CM

## 2019-04-27 DIAGNOSIS — I130 Hypertensive heart and chronic kidney disease with heart failure and stage 1 through stage 4 chronic kidney disease, or unspecified chronic kidney disease: Secondary | ICD-10-CM

## 2019-04-27 DIAGNOSIS — I5032 Chronic diastolic (congestive) heart failure: Secondary | ICD-10-CM

## 2019-04-27 DIAGNOSIS — I2510 Atherosclerotic heart disease of native coronary artery without angina pectoris: Secondary | ICD-10-CM

## 2019-04-28 DIAGNOSIS — N179 Acute kidney failure, unspecified: Secondary | ICD-10-CM | POA: Diagnosis not present

## 2019-04-28 DIAGNOSIS — I959 Hypotension, unspecified: Secondary | ICD-10-CM | POA: Diagnosis not present

## 2019-04-28 DIAGNOSIS — E43 Unspecified severe protein-calorie malnutrition: Secondary | ICD-10-CM | POA: Diagnosis not present

## 2019-04-28 DIAGNOSIS — I5032 Chronic diastolic (congestive) heart failure: Secondary | ICD-10-CM | POA: Diagnosis not present

## 2019-04-29 DIAGNOSIS — I5032 Chronic diastolic (congestive) heart failure: Secondary | ICD-10-CM | POA: Diagnosis not present

## 2019-04-29 DIAGNOSIS — I959 Hypotension, unspecified: Secondary | ICD-10-CM | POA: Diagnosis not present

## 2019-04-29 DIAGNOSIS — E43 Unspecified severe protein-calorie malnutrition: Secondary | ICD-10-CM | POA: Diagnosis not present

## 2019-04-29 DIAGNOSIS — N179 Acute kidney failure, unspecified: Secondary | ICD-10-CM | POA: Diagnosis not present

## 2019-04-30 DIAGNOSIS — E43 Unspecified severe protein-calorie malnutrition: Secondary | ICD-10-CM | POA: Diagnosis not present

## 2019-04-30 DIAGNOSIS — I5032 Chronic diastolic (congestive) heart failure: Secondary | ICD-10-CM | POA: Diagnosis not present

## 2019-04-30 DIAGNOSIS — I959 Hypotension, unspecified: Secondary | ICD-10-CM | POA: Diagnosis not present

## 2019-04-30 DIAGNOSIS — N179 Acute kidney failure, unspecified: Secondary | ICD-10-CM | POA: Diagnosis not present

## 2019-05-01 DIAGNOSIS — N179 Acute kidney failure, unspecified: Secondary | ICD-10-CM | POA: Diagnosis not present

## 2019-05-01 DIAGNOSIS — I5032 Chronic diastolic (congestive) heart failure: Secondary | ICD-10-CM | POA: Diagnosis not present

## 2019-05-01 DIAGNOSIS — I959 Hypotension, unspecified: Secondary | ICD-10-CM | POA: Diagnosis not present

## 2019-05-01 DIAGNOSIS — I951 Orthostatic hypotension: Secondary | ICD-10-CM

## 2019-05-01 DIAGNOSIS — E43 Unspecified severe protein-calorie malnutrition: Secondary | ICD-10-CM | POA: Diagnosis not present

## 2019-05-02 DIAGNOSIS — E43 Unspecified severe protein-calorie malnutrition: Secondary | ICD-10-CM | POA: Diagnosis not present

## 2019-05-02 DIAGNOSIS — N179 Acute kidney failure, unspecified: Secondary | ICD-10-CM | POA: Diagnosis not present

## 2019-05-02 DIAGNOSIS — I959 Hypotension, unspecified: Secondary | ICD-10-CM | POA: Diagnosis not present

## 2019-05-02 DIAGNOSIS — I5032 Chronic diastolic (congestive) heart failure: Secondary | ICD-10-CM | POA: Diagnosis not present

## 2020-01-17 DIAGNOSIS — R443 Hallucinations, unspecified: Secondary | ICD-10-CM | POA: Diagnosis not present

## 2020-01-17 DIAGNOSIS — I5032 Chronic diastolic (congestive) heart failure: Secondary | ICD-10-CM

## 2020-01-17 DIAGNOSIS — R7989 Other specified abnormal findings of blood chemistry: Secondary | ICD-10-CM

## 2020-01-17 DIAGNOSIS — I160 Hypertensive urgency: Secondary | ICD-10-CM

## 2020-01-17 DIAGNOSIS — E119 Type 2 diabetes mellitus without complications: Secondary | ICD-10-CM

## 2020-01-17 DIAGNOSIS — R7881 Bacteremia: Secondary | ICD-10-CM | POA: Diagnosis not present

## 2020-01-17 DIAGNOSIS — N183 Chronic kidney disease, stage 3 unspecified: Secondary | ICD-10-CM

## 2020-01-18 DIAGNOSIS — I160 Hypertensive urgency: Secondary | ICD-10-CM | POA: Diagnosis not present

## 2020-01-18 DIAGNOSIS — R7881 Bacteremia: Secondary | ICD-10-CM | POA: Diagnosis not present

## 2020-01-18 DIAGNOSIS — R443 Hallucinations, unspecified: Secondary | ICD-10-CM | POA: Diagnosis not present

## 2020-01-18 DIAGNOSIS — N183 Chronic kidney disease, stage 3 unspecified: Secondary | ICD-10-CM | POA: Diagnosis not present

## 2020-01-18 DIAGNOSIS — E119 Type 2 diabetes mellitus without complications: Secondary | ICD-10-CM | POA: Diagnosis not present

## 2020-01-19 DIAGNOSIS — R443 Hallucinations, unspecified: Secondary | ICD-10-CM | POA: Diagnosis not present

## 2020-01-19 DIAGNOSIS — E119 Type 2 diabetes mellitus without complications: Secondary | ICD-10-CM | POA: Diagnosis not present

## 2020-01-19 DIAGNOSIS — I160 Hypertensive urgency: Secondary | ICD-10-CM | POA: Diagnosis not present

## 2020-01-19 DIAGNOSIS — R7881 Bacteremia: Secondary | ICD-10-CM

## 2020-01-19 DIAGNOSIS — N183 Chronic kidney disease, stage 3 unspecified: Secondary | ICD-10-CM | POA: Diagnosis not present

## 2020-01-20 DIAGNOSIS — R443 Hallucinations, unspecified: Secondary | ICD-10-CM | POA: Diagnosis not present

## 2020-01-20 DIAGNOSIS — I160 Hypertensive urgency: Secondary | ICD-10-CM | POA: Diagnosis not present

## 2020-01-20 DIAGNOSIS — E119 Type 2 diabetes mellitus without complications: Secondary | ICD-10-CM | POA: Diagnosis not present

## 2020-01-20 DIAGNOSIS — N183 Chronic kidney disease, stage 3 unspecified: Secondary | ICD-10-CM | POA: Diagnosis not present

## 2020-01-21 DIAGNOSIS — I160 Hypertensive urgency: Secondary | ICD-10-CM | POA: Diagnosis not present

## 2020-01-21 DIAGNOSIS — N183 Chronic kidney disease, stage 3 unspecified: Secondary | ICD-10-CM | POA: Diagnosis not present

## 2020-01-21 DIAGNOSIS — R443 Hallucinations, unspecified: Secondary | ICD-10-CM | POA: Diagnosis not present

## 2020-01-21 DIAGNOSIS — E119 Type 2 diabetes mellitus without complications: Secondary | ICD-10-CM | POA: Diagnosis not present

## 2020-01-22 DIAGNOSIS — R443 Hallucinations, unspecified: Secondary | ICD-10-CM | POA: Diagnosis not present

## 2020-01-22 DIAGNOSIS — N183 Chronic kidney disease, stage 3 unspecified: Secondary | ICD-10-CM | POA: Diagnosis not present

## 2020-01-22 DIAGNOSIS — I160 Hypertensive urgency: Secondary | ICD-10-CM | POA: Diagnosis not present

## 2020-01-22 DIAGNOSIS — E119 Type 2 diabetes mellitus without complications: Secondary | ICD-10-CM | POA: Diagnosis not present

## 2020-01-23 DIAGNOSIS — N183 Chronic kidney disease, stage 3 unspecified: Secondary | ICD-10-CM | POA: Diagnosis not present

## 2020-01-23 DIAGNOSIS — R443 Hallucinations, unspecified: Secondary | ICD-10-CM | POA: Diagnosis not present

## 2020-01-23 DIAGNOSIS — E119 Type 2 diabetes mellitus without complications: Secondary | ICD-10-CM | POA: Diagnosis not present

## 2020-01-23 DIAGNOSIS — I160 Hypertensive urgency: Secondary | ICD-10-CM | POA: Diagnosis not present

## 2020-01-24 DIAGNOSIS — R443 Hallucinations, unspecified: Secondary | ICD-10-CM | POA: Diagnosis not present

## 2020-01-24 DIAGNOSIS — I160 Hypertensive urgency: Secondary | ICD-10-CM | POA: Diagnosis not present

## 2020-01-24 DIAGNOSIS — N183 Chronic kidney disease, stage 3 unspecified: Secondary | ICD-10-CM | POA: Diagnosis not present

## 2020-01-24 DIAGNOSIS — E119 Type 2 diabetes mellitus without complications: Secondary | ICD-10-CM | POA: Diagnosis not present

## 2020-01-25 DIAGNOSIS — N183 Chronic kidney disease, stage 3 unspecified: Secondary | ICD-10-CM | POA: Diagnosis not present

## 2020-01-25 DIAGNOSIS — E119 Type 2 diabetes mellitus without complications: Secondary | ICD-10-CM | POA: Diagnosis not present

## 2020-01-25 DIAGNOSIS — I160 Hypertensive urgency: Secondary | ICD-10-CM | POA: Diagnosis not present

## 2020-01-25 DIAGNOSIS — R443 Hallucinations, unspecified: Secondary | ICD-10-CM | POA: Diagnosis not present

## 2020-01-26 DIAGNOSIS — N183 Chronic kidney disease, stage 3 unspecified: Secondary | ICD-10-CM | POA: Diagnosis not present

## 2020-01-26 DIAGNOSIS — E119 Type 2 diabetes mellitus without complications: Secondary | ICD-10-CM | POA: Diagnosis not present

## 2020-01-26 DIAGNOSIS — R443 Hallucinations, unspecified: Secondary | ICD-10-CM | POA: Diagnosis not present

## 2020-01-26 DIAGNOSIS — I160 Hypertensive urgency: Secondary | ICD-10-CM | POA: Diagnosis not present

## 2020-01-27 DIAGNOSIS — I160 Hypertensive urgency: Secondary | ICD-10-CM | POA: Diagnosis not present

## 2020-01-27 DIAGNOSIS — R443 Hallucinations, unspecified: Secondary | ICD-10-CM | POA: Diagnosis not present

## 2020-01-27 DIAGNOSIS — N183 Chronic kidney disease, stage 3 unspecified: Secondary | ICD-10-CM | POA: Diagnosis not present

## 2020-01-27 DIAGNOSIS — E119 Type 2 diabetes mellitus without complications: Secondary | ICD-10-CM | POA: Diagnosis not present

## 2020-01-28 DIAGNOSIS — E119 Type 2 diabetes mellitus without complications: Secondary | ICD-10-CM | POA: Diagnosis not present

## 2020-01-28 DIAGNOSIS — N183 Chronic kidney disease, stage 3 unspecified: Secondary | ICD-10-CM | POA: Diagnosis not present

## 2020-01-28 DIAGNOSIS — R443 Hallucinations, unspecified: Secondary | ICD-10-CM | POA: Diagnosis not present

## 2020-01-28 DIAGNOSIS — I160 Hypertensive urgency: Secondary | ICD-10-CM | POA: Diagnosis not present

## 2020-01-29 DIAGNOSIS — N183 Chronic kidney disease, stage 3 unspecified: Secondary | ICD-10-CM | POA: Diagnosis not present

## 2020-01-29 DIAGNOSIS — R443 Hallucinations, unspecified: Secondary | ICD-10-CM | POA: Diagnosis not present

## 2020-01-29 DIAGNOSIS — I160 Hypertensive urgency: Secondary | ICD-10-CM | POA: Diagnosis not present

## 2020-01-29 DIAGNOSIS — E119 Type 2 diabetes mellitus without complications: Secondary | ICD-10-CM | POA: Diagnosis not present

## 2020-01-30 DIAGNOSIS — R443 Hallucinations, unspecified: Secondary | ICD-10-CM | POA: Diagnosis not present

## 2020-01-30 DIAGNOSIS — N183 Chronic kidney disease, stage 3 unspecified: Secondary | ICD-10-CM | POA: Diagnosis not present

## 2020-01-30 DIAGNOSIS — E119 Type 2 diabetes mellitus without complications: Secondary | ICD-10-CM | POA: Diagnosis not present

## 2020-01-30 DIAGNOSIS — I160 Hypertensive urgency: Secondary | ICD-10-CM | POA: Diagnosis not present

## 2020-01-31 DIAGNOSIS — R443 Hallucinations, unspecified: Secondary | ICD-10-CM | POA: Diagnosis not present

## 2020-01-31 DIAGNOSIS — N183 Chronic kidney disease, stage 3 unspecified: Secondary | ICD-10-CM | POA: Diagnosis not present

## 2020-01-31 DIAGNOSIS — E119 Type 2 diabetes mellitus without complications: Secondary | ICD-10-CM | POA: Diagnosis not present

## 2020-01-31 DIAGNOSIS — I160 Hypertensive urgency: Secondary | ICD-10-CM | POA: Diagnosis not present

## 2020-02-01 DIAGNOSIS — N183 Chronic kidney disease, stage 3 unspecified: Secondary | ICD-10-CM | POA: Diagnosis not present

## 2020-02-01 DIAGNOSIS — E119 Type 2 diabetes mellitus without complications: Secondary | ICD-10-CM | POA: Diagnosis not present

## 2020-02-01 DIAGNOSIS — R443 Hallucinations, unspecified: Secondary | ICD-10-CM | POA: Diagnosis not present

## 2020-02-01 DIAGNOSIS — I160 Hypertensive urgency: Secondary | ICD-10-CM | POA: Diagnosis not present

## 2020-02-02 DIAGNOSIS — R443 Hallucinations, unspecified: Secondary | ICD-10-CM | POA: Diagnosis not present

## 2020-02-02 DIAGNOSIS — I160 Hypertensive urgency: Secondary | ICD-10-CM | POA: Diagnosis not present

## 2020-02-02 DIAGNOSIS — N183 Chronic kidney disease, stage 3 unspecified: Secondary | ICD-10-CM | POA: Diagnosis not present

## 2020-02-02 DIAGNOSIS — E119 Type 2 diabetes mellitus without complications: Secondary | ICD-10-CM | POA: Diagnosis not present

## 2020-02-03 DIAGNOSIS — E119 Type 2 diabetes mellitus without complications: Secondary | ICD-10-CM | POA: Diagnosis not present

## 2020-02-03 DIAGNOSIS — I160 Hypertensive urgency: Secondary | ICD-10-CM | POA: Diagnosis not present

## 2020-02-03 DIAGNOSIS — R443 Hallucinations, unspecified: Secondary | ICD-10-CM | POA: Diagnosis not present

## 2020-02-03 DIAGNOSIS — N183 Chronic kidney disease, stage 3 unspecified: Secondary | ICD-10-CM | POA: Diagnosis not present

## 2021-04-10 DIAGNOSIS — I10 Essential (primary) hypertension: Secondary | ICD-10-CM

## 2021-04-10 DIAGNOSIS — R778 Other specified abnormalities of plasma proteins: Secondary | ICD-10-CM

## 2021-04-10 DIAGNOSIS — N185 Chronic kidney disease, stage 5: Secondary | ICD-10-CM

## 2021-04-10 DIAGNOSIS — Z86711 Personal history of pulmonary embolism: Secondary | ICD-10-CM

## 2021-04-10 DIAGNOSIS — E785 Hyperlipidemia, unspecified: Secondary | ICD-10-CM

## 2021-04-10 DIAGNOSIS — I2510 Atherosclerotic heart disease of native coronary artery without angina pectoris: Secondary | ICD-10-CM

## 2021-05-06 DEATH — deceased
